# Patient Record
Sex: Female | Born: 1950
Health system: Southern US, Community
[De-identification: ages and names within clinical notes are randomized; demographics above are authoritative.]

## PROBLEM LIST (undated history)

## (undated) DIAGNOSIS — T4145XA Adverse effect of unspecified anesthetic, initial encounter: Secondary | ICD-10-CM

## (undated) DIAGNOSIS — R519 Headache, unspecified: Secondary | ICD-10-CM

## (undated) DIAGNOSIS — F419 Anxiety disorder, unspecified: Secondary | ICD-10-CM

## (undated) DIAGNOSIS — F32A Depression, unspecified: Secondary | ICD-10-CM

## (undated) DIAGNOSIS — N189 Chronic kidney disease, unspecified: Secondary | ICD-10-CM

## (undated) DIAGNOSIS — Z923 Personal history of irradiation: Secondary | ICD-10-CM

## (undated) DIAGNOSIS — K219 Gastro-esophageal reflux disease without esophagitis: Secondary | ICD-10-CM

## (undated) DIAGNOSIS — M67919 Unspecified disorder of synovium and tendon, unspecified shoulder: Secondary | ICD-10-CM

## (undated) DIAGNOSIS — R112 Nausea with vomiting, unspecified: Secondary | ICD-10-CM

## (undated) DIAGNOSIS — C50919 Malignant neoplasm of unspecified site of unspecified female breast: Secondary | ICD-10-CM

## (undated) DIAGNOSIS — R51 Headache: Secondary | ICD-10-CM

## (undated) DIAGNOSIS — M6530 Trigger finger, unspecified finger: Secondary | ICD-10-CM

## (undated) DIAGNOSIS — IMO0001 Reserved for inherently not codable concepts without codable children: Secondary | ICD-10-CM

## (undated) DIAGNOSIS — G4730 Sleep apnea, unspecified: Secondary | ICD-10-CM

## (undated) DIAGNOSIS — K5900 Constipation, unspecified: Secondary | ICD-10-CM

## (undated) DIAGNOSIS — Z9889 Other specified postprocedural states: Secondary | ICD-10-CM

## (undated) DIAGNOSIS — D869 Sarcoidosis, unspecified: Secondary | ICD-10-CM

## (undated) DIAGNOSIS — T8859XA Other complications of anesthesia, initial encounter: Secondary | ICD-10-CM

## (undated) DIAGNOSIS — M1990 Unspecified osteoarthritis, unspecified site: Secondary | ICD-10-CM

## (undated) DIAGNOSIS — F329 Major depressive disorder, single episode, unspecified: Secondary | ICD-10-CM

## (undated) DIAGNOSIS — I10 Essential (primary) hypertension: Secondary | ICD-10-CM

## (undated) HISTORY — PX: KIDNEY CYST REMOVAL: SHX684

## (undated) HISTORY — PX: TONSILLECTOMY: SUR1361

## (undated) HISTORY — DX: Constipation, unspecified: K59.00

## (undated) HISTORY — PX: SHOULDER ARTHROSCOPY W/ ROTATOR CUFF REPAIR: SHX2400

## (undated) HISTORY — PX: ABDOMINAL HYSTERECTOMY: SHX81

## (undated) HISTORY — PX: TOTAL KNEE ARTHROPLASTY: SHX125

## (undated) HISTORY — PX: OTHER SURGICAL HISTORY: SHX169

## (undated) HISTORY — PX: NEPHRECTOMY: SHX65

---
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
## 2001-05-11 HISTORY — PX: BREAST EXCISIONAL BIOPSY: SUR124

## 2004-05-11 DIAGNOSIS — N189 Chronic kidney disease, unspecified: Secondary | ICD-10-CM

## 2004-05-11 HISTORY — DX: Chronic kidney disease, unspecified: N18.9

## 2004-07-15 ENCOUNTER — Ambulatory Visit: Payer: Self-pay | Admitting: Internal Medicine

## 2005-01-01 ENCOUNTER — Encounter: Payer: Self-pay | Admitting: General Practice

## 2005-01-09 ENCOUNTER — Encounter: Payer: Self-pay | Admitting: General Practice

## 2005-02-08 ENCOUNTER — Encounter: Payer: Self-pay | Admitting: General Practice

## 2005-02-23 ENCOUNTER — Encounter: Payer: Self-pay | Admitting: Internal Medicine

## 2005-02-26 ENCOUNTER — Ambulatory Visit: Payer: Self-pay | Admitting: Internal Medicine

## 2005-03-11 ENCOUNTER — Encounter: Payer: Self-pay | Admitting: General Practice

## 2005-03-25 ENCOUNTER — Other Ambulatory Visit: Payer: Self-pay

## 2005-03-26 ENCOUNTER — Ambulatory Visit: Payer: Self-pay | Admitting: Unknown Physician Specialty

## 2005-05-11 HISTORY — PX: JOINT REPLACEMENT: SHX530

## 2005-12-09 ENCOUNTER — Ambulatory Visit: Payer: Self-pay | Admitting: Internal Medicine

## 2006-02-16 ENCOUNTER — Ambulatory Visit: Payer: Self-pay | Admitting: Internal Medicine

## 2006-02-24 ENCOUNTER — Ambulatory Visit: Payer: Self-pay | Admitting: Internal Medicine

## 2006-03-09 ENCOUNTER — Other Ambulatory Visit: Payer: Self-pay

## 2006-03-15 ENCOUNTER — Inpatient Hospital Stay: Payer: Self-pay | Admitting: Urology

## 2006-03-15 DIAGNOSIS — D1771 Benign lipomatous neoplasm of kidney: Secondary | ICD-10-CM

## 2006-03-31 ENCOUNTER — Ambulatory Visit: Payer: Self-pay | Admitting: Internal Medicine

## 2006-04-07 ENCOUNTER — Ambulatory Visit: Payer: Self-pay | Admitting: Vascular Surgery

## 2006-04-28 ENCOUNTER — Ambulatory Visit: Payer: Self-pay | Admitting: Internal Medicine

## 2006-06-16 ENCOUNTER — Ambulatory Visit: Payer: Self-pay | Admitting: Internal Medicine

## 2006-06-16 ENCOUNTER — Ambulatory Visit: Payer: Self-pay | Admitting: Pain Medicine

## 2006-06-28 ENCOUNTER — Ambulatory Visit: Payer: Self-pay | Admitting: Pain Medicine

## 2006-06-29 ENCOUNTER — Ambulatory Visit: Payer: Self-pay | Admitting: Pain Medicine

## 2006-07-15 ENCOUNTER — Ambulatory Visit: Payer: Self-pay | Admitting: Pain Medicine

## 2006-07-29 ENCOUNTER — Ambulatory Visit: Payer: Self-pay | Admitting: Pain Medicine

## 2006-08-05 ENCOUNTER — Ambulatory Visit: Payer: Self-pay | Admitting: Gastroenterology

## 2006-08-12 ENCOUNTER — Ambulatory Visit: Payer: Self-pay | Admitting: Physician Assistant

## 2006-12-28 ENCOUNTER — Ambulatory Visit: Payer: Self-pay | Admitting: Unknown Physician Specialty

## 2006-12-28 ENCOUNTER — Other Ambulatory Visit: Payer: Self-pay

## 2006-12-29 ENCOUNTER — Ambulatory Visit: Payer: Self-pay | Admitting: Unknown Physician Specialty

## 2007-09-01 ENCOUNTER — Ambulatory Visit: Payer: Self-pay | Admitting: Internal Medicine

## 2008-06-11 ENCOUNTER — Ambulatory Visit: Payer: Self-pay | Admitting: Unknown Physician Specialty

## 2008-06-12 ENCOUNTER — Inpatient Hospital Stay: Payer: Self-pay | Admitting: Unknown Physician Specialty

## 2008-10-17 ENCOUNTER — Ambulatory Visit: Payer: Self-pay | Admitting: Internal Medicine

## 2009-10-22 ENCOUNTER — Ambulatory Visit: Payer: Self-pay | Admitting: Internal Medicine

## 2009-12-06 ENCOUNTER — Ambulatory Visit: Payer: Self-pay

## 2010-10-24 ENCOUNTER — Ambulatory Visit: Payer: Self-pay | Admitting: Internal Medicine

## 2011-05-12 HISTORY — PX: COLONOSCOPY: SHX174

## 2011-10-27 ENCOUNTER — Ambulatory Visit: Payer: Self-pay | Admitting: Internal Medicine

## 2011-12-30 ENCOUNTER — Ambulatory Visit: Payer: Self-pay | Admitting: Gastroenterology

## 2012-11-02 ENCOUNTER — Ambulatory Visit: Payer: Self-pay | Admitting: Internal Medicine

## 2013-11-01 DIAGNOSIS — E7800 Pure hypercholesterolemia, unspecified: Secondary | ICD-10-CM | POA: Insufficient documentation

## 2013-11-01 DIAGNOSIS — M4800 Spinal stenosis, site unspecified: Secondary | ICD-10-CM | POA: Insufficient documentation

## 2013-11-01 DIAGNOSIS — I10 Essential (primary) hypertension: Secondary | ICD-10-CM | POA: Insufficient documentation

## 2013-11-30 ENCOUNTER — Ambulatory Visit: Payer: Self-pay | Admitting: Internal Medicine

## 2014-05-15 DIAGNOSIS — M1711 Unilateral primary osteoarthritis, right knee: Secondary | ICD-10-CM | POA: Diagnosis not present

## 2014-06-06 DIAGNOSIS — M549 Dorsalgia, unspecified: Secondary | ICD-10-CM | POA: Diagnosis not present

## 2014-06-06 DIAGNOSIS — M5136 Other intervertebral disc degeneration, lumbar region: Secondary | ICD-10-CM | POA: Diagnosis not present

## 2014-07-17 DIAGNOSIS — J042 Acute laryngotracheitis: Secondary | ICD-10-CM | POA: Diagnosis not present

## 2014-07-17 DIAGNOSIS — H5210 Myopia, unspecified eye: Secondary | ICD-10-CM | POA: Diagnosis not present

## 2014-07-17 DIAGNOSIS — H524 Presbyopia: Secondary | ICD-10-CM | POA: Diagnosis not present

## 2014-07-31 DIAGNOSIS — J209 Acute bronchitis, unspecified: Secondary | ICD-10-CM | POA: Diagnosis not present

## 2014-08-15 DIAGNOSIS — M7732 Calcaneal spur, left foot: Secondary | ICD-10-CM | POA: Diagnosis not present

## 2014-08-15 DIAGNOSIS — M7662 Achilles tendinitis, left leg: Secondary | ICD-10-CM | POA: Diagnosis not present

## 2014-08-15 DIAGNOSIS — M71572 Other bursitis, not elsewhere classified, left ankle and foot: Secondary | ICD-10-CM | POA: Diagnosis not present

## 2014-08-21 DIAGNOSIS — M71572 Other bursitis, not elsewhere classified, left ankle and foot: Secondary | ICD-10-CM | POA: Diagnosis not present

## 2014-08-21 DIAGNOSIS — M7662 Achilles tendinitis, left leg: Secondary | ICD-10-CM | POA: Diagnosis not present

## 2014-09-05 DIAGNOSIS — M71572 Other bursitis, not elsewhere classified, left ankle and foot: Secondary | ICD-10-CM | POA: Diagnosis not present

## 2014-09-05 DIAGNOSIS — M7662 Achilles tendinitis, left leg: Secondary | ICD-10-CM | POA: Diagnosis not present

## 2014-09-20 ENCOUNTER — Ambulatory Visit: Payer: Self-pay | Admitting: Podiatry

## 2014-09-24 DIAGNOSIS — M25561 Pain in right knee: Secondary | ICD-10-CM | POA: Diagnosis not present

## 2014-09-24 DIAGNOSIS — M1711 Unilateral primary osteoarthritis, right knee: Secondary | ICD-10-CM | POA: Diagnosis not present

## 2014-09-25 ENCOUNTER — Ambulatory Visit: Payer: Self-pay | Admitting: Podiatry

## 2014-09-27 ENCOUNTER — Ambulatory Visit: Payer: Self-pay | Admitting: Podiatry

## 2014-09-27 DIAGNOSIS — M7662 Achilles tendinitis, left leg: Secondary | ICD-10-CM | POA: Diagnosis not present

## 2014-09-27 DIAGNOSIS — M898X9 Other specified disorders of bone, unspecified site: Secondary | ICD-10-CM | POA: Diagnosis not present

## 2014-10-10 DIAGNOSIS — M898X9 Other specified disorders of bone, unspecified site: Secondary | ICD-10-CM | POA: Diagnosis not present

## 2014-10-10 DIAGNOSIS — M7662 Achilles tendinitis, left leg: Secondary | ICD-10-CM | POA: Diagnosis not present

## 2014-10-30 DIAGNOSIS — I10 Essential (primary) hypertension: Secondary | ICD-10-CM | POA: Diagnosis not present

## 2014-11-06 DIAGNOSIS — E780 Pure hypercholesterolemia: Secondary | ICD-10-CM | POA: Diagnosis not present

## 2014-11-06 DIAGNOSIS — Z0001 Encounter for general adult medical examination with abnormal findings: Secondary | ICD-10-CM | POA: Diagnosis not present

## 2014-11-06 DIAGNOSIS — M940 Chondrocostal junction syndrome [Tietze]: Secondary | ICD-10-CM | POA: Diagnosis not present

## 2014-11-06 DIAGNOSIS — I10 Essential (primary) hypertension: Secondary | ICD-10-CM | POA: Diagnosis not present

## 2014-11-06 DIAGNOSIS — C649 Malignant neoplasm of unspecified kidney, except renal pelvis: Secondary | ICD-10-CM | POA: Insufficient documentation

## 2014-11-07 ENCOUNTER — Other Ambulatory Visit: Payer: Self-pay | Admitting: Internal Medicine

## 2014-11-07 DIAGNOSIS — Z1231 Encounter for screening mammogram for malignant neoplasm of breast: Secondary | ICD-10-CM

## 2014-12-04 ENCOUNTER — Ambulatory Visit
Admission: RE | Admit: 2014-12-04 | Discharge: 2014-12-04 | Disposition: A | Discharge status: Home / Self Care | Payer: Commercial Managed Care - HMO | Source: Ambulatory Visit | Attending: Internal Medicine | Admitting: Internal Medicine

## 2014-12-04 ENCOUNTER — Other Ambulatory Visit: Payer: Self-pay | Admitting: Internal Medicine

## 2014-12-04 DIAGNOSIS — Z1231 Encounter for screening mammogram for malignant neoplasm of breast: Secondary | ICD-10-CM | POA: Insufficient documentation

## 2014-12-04 DIAGNOSIS — E2839 Other primary ovarian failure: Secondary | ICD-10-CM | POA: Diagnosis not present

## 2014-12-18 DIAGNOSIS — G8929 Other chronic pain: Secondary | ICD-10-CM | POA: Diagnosis not present

## 2014-12-18 DIAGNOSIS — N644 Mastodynia: Secondary | ICD-10-CM | POA: Diagnosis not present

## 2014-12-18 DIAGNOSIS — M25512 Pain in left shoulder: Secondary | ICD-10-CM | POA: Diagnosis not present

## 2015-01-29 DIAGNOSIS — M7732 Calcaneal spur, left foot: Secondary | ICD-10-CM | POA: Diagnosis not present

## 2015-01-29 DIAGNOSIS — M7662 Achilles tendinitis, left leg: Secondary | ICD-10-CM | POA: Diagnosis not present

## 2015-01-29 DIAGNOSIS — M898X9 Other specified disorders of bone, unspecified site: Secondary | ICD-10-CM | POA: Diagnosis not present

## 2015-02-12 ENCOUNTER — Encounter
Admission: RE | Admit: 2015-02-12 | Discharge: 2015-02-12 | Disposition: A | Discharge status: Home / Self Care | Payer: Commercial Managed Care - HMO | Source: Ambulatory Visit | Attending: Podiatry | Admitting: Podiatry

## 2015-02-12 DIAGNOSIS — M898X9 Other specified disorders of bone, unspecified site: Secondary | ICD-10-CM | POA: Diagnosis not present

## 2015-02-12 DIAGNOSIS — M7732 Calcaneal spur, left foot: Secondary | ICD-10-CM | POA: Insufficient documentation

## 2015-02-12 DIAGNOSIS — M7662 Achilles tendinitis, left leg: Secondary | ICD-10-CM | POA: Insufficient documentation

## 2015-02-12 DIAGNOSIS — Z01818 Encounter for other preprocedural examination: Secondary | ICD-10-CM | POA: Diagnosis not present

## 2015-02-12 DIAGNOSIS — I10 Essential (primary) hypertension: Secondary | ICD-10-CM | POA: Diagnosis not present

## 2015-02-12 HISTORY — DX: Major depressive disorder, single episode, unspecified: F32.9

## 2015-02-12 HISTORY — DX: Reserved for inherently not codable concepts without codable children: IMO0001

## 2015-02-12 HISTORY — DX: Headache, unspecified: R51.9

## 2015-02-12 HISTORY — DX: Depression, unspecified: F32.A

## 2015-02-12 HISTORY — DX: Anxiety disorder, unspecified: F41.9

## 2015-02-12 HISTORY — DX: Unspecified osteoarthritis, unspecified site: M19.90

## 2015-02-12 HISTORY — DX: Headache: R51

## 2015-02-12 HISTORY — DX: Gastro-esophageal reflux disease without esophagitis: K21.9

## 2015-02-12 HISTORY — DX: Essential (primary) hypertension: I10

## 2015-02-12 HISTORY — DX: Chronic kidney disease, unspecified: N18.9

## 2015-02-12 LAB — CBC
HCT: 42.1 % (ref 35.0–47.0)
Hemoglobin: 14.1 g/dL (ref 12.0–16.0)
MCH: 30.4 pg (ref 26.0–34.0)
MCHC: 33.6 g/dL (ref 32.0–36.0)
MCV: 90.6 fL (ref 80.0–100.0)
Platelets: 192 10*3/uL (ref 150–440)
RBC: 4.65 MIL/uL (ref 3.80–5.20)
RDW: 14.4 % (ref 11.5–14.5)
WBC: 7.1 10*3/uL (ref 3.6–11.0)

## 2015-02-12 LAB — BASIC METABOLIC PANEL
Anion gap: 7 (ref 5–15)
BUN: 21 mg/dL — ABNORMAL HIGH (ref 6–20)
CO2: 32 mmol/L (ref 22–32)
Calcium: 10.1 mg/dL (ref 8.9–10.3)
Chloride: 103 mmol/L (ref 101–111)
Creatinine, Ser: 0.87 mg/dL (ref 0.44–1.00)
GFR calc Af Amer: >60 mL/min (ref >60)
GFR calc non Af Amer: >60 mL/min (ref >60)
Glucose, Bld: 106 mg/dL — ABNORMAL HIGH (ref 65–99)
Potassium: 3.6 mmol/L (ref 3.5–5.1)
Sodium: 142 mmol/L (ref 135–145)

## 2015-02-12 LAB — DIFFERENTIAL
BASOS ABS: 0.1 10*3/uL (ref 0–0.1)
BASOS PCT: 1 %
EOS ABS: 0.5 10*3/uL (ref 0–0.7)
Eosinophils Relative: 7 %
LYMPHS ABS: 1.1 10*3/uL (ref 1.0–3.6)
Lymphocytes Relative: 16 %
Monocytes Absolute: 1 10*3/uL — ABNORMAL HIGH (ref 0.2–0.9)
Monocytes Relative: 15 %
NEUTROS PCT: 61 %
Neutro Abs: 4.4 10*3/uL (ref 1.4–6.5)

## 2015-02-12 NOTE — Patient Instructions (Signed)
  Your procedure is scheduled [id number] 14, 2016 (Friday) Report to Day Surgery.[facility]) To find out your arrival time please call [phone] between 1PM - 3PM on February 21, 2015 (Thursday).  Remember: Instructions that are not followed completely may result in serious medical risk, up to and including death, or upon the discretion of your surgeon and anesthesiologist your surgery may need to be rescheduled.    ___x_ 1. Do not eat food or drink liquids after midnight. No gum chewing or hard candies.     ____ 2. No Alcohol for 24 hours before or after surgery.   ____ 3. Bring all medications with you on the day of surgery if instructed.    __x__ 4. Notify your doctor if there is any change in your medical condition     (cold, fever, infections).     Do not wear jewelry, make-up, hairpins, clips or nail polish.  Do not wear lotions, powders, or perfumes. You may wear deodorant.  Do not shave 48 hours prior to surgery. Men may shave face and neck.  Do not bring valuables to the hospital.    [facility] is not responsible for any belongings or valuables.               Contacts, dentures or bridgework may not be worn into surgery.  Leave your suitcase in the car. After surgery it may be brought to your room.  For patients admitted to the hospital, discharge time is determined by your                treatment team.   Patients discharged the day of surgery will not be allowed to drive home.   Please read over the following fact sheets that you were given:   Surgical Site Infection Prevention   __x__ Take these medicines the morning of surgery with A SIP OF WATER:    1. Omeprazole  2.   3.   4.  5.  6.  ____ Fleet Enema (as directed)   __x__ Use CHG Soap as directed  ____ Use inhalers on the day of surgery  ____ Stop metformin 2 days prior to surgery    ____ Take 1/2 of usual insulin dose the night before surgery and none on the morning of surgery.   ____ Stop  Coumadin/Plavix/aspirin on   x__ Stop Anti-inflammatories on (Stop Nabumetone one week before surgery)   _x___ Stop supplements until after surgery.  (Stop Fish Oil ,Melatonin now)  ____ Bring C-Pap to the hospital.

## 2015-02-13 DIAGNOSIS — G8929 Other chronic pain: Secondary | ICD-10-CM | POA: Diagnosis not present

## 2015-02-13 DIAGNOSIS — M19012 Primary osteoarthritis, left shoulder: Secondary | ICD-10-CM | POA: Diagnosis not present

## 2015-02-13 DIAGNOSIS — M25561 Pain in right knee: Secondary | ICD-10-CM | POA: Diagnosis not present

## 2015-02-13 DIAGNOSIS — M1711 Unilateral primary osteoarthritis, right knee: Secondary | ICD-10-CM | POA: Diagnosis not present

## 2015-02-13 DIAGNOSIS — M75102 Unspecified rotator cuff tear or rupture of left shoulder, not specified as traumatic: Secondary | ICD-10-CM | POA: Diagnosis not present

## 2015-02-13 DIAGNOSIS — M7582 Other shoulder lesions, left shoulder: Secondary | ICD-10-CM | POA: Diagnosis not present

## 2015-02-13 DIAGNOSIS — M25512 Pain in left shoulder: Secondary | ICD-10-CM | POA: Diagnosis not present

## 2015-02-19 DIAGNOSIS — M7582 Other shoulder lesions, left shoulder: Secondary | ICD-10-CM | POA: Diagnosis not present

## 2015-02-20 DIAGNOSIS — I10 Essential (primary) hypertension: Secondary | ICD-10-CM | POA: Diagnosis not present

## 2015-02-20 DIAGNOSIS — E7800 Pure hypercholesterolemia, unspecified: Secondary | ICD-10-CM | POA: Diagnosis not present

## 2015-02-22 ENCOUNTER — Encounter
Admission: RE | Disposition: A | Discharge status: Home / Self Care | Payer: Self-pay | Source: Ambulatory Visit | Attending: Podiatry

## 2015-02-22 ENCOUNTER — Encounter: Payer: Self-pay | Admitting: *Deleted

## 2015-02-22 ENCOUNTER — Ambulatory Visit
Admission: RE | Admit: 2015-02-22 | Discharge: 2015-02-22 | Disposition: A | Discharge status: Home / Self Care | Payer: Commercial Managed Care - HMO | Source: Ambulatory Visit | Attending: Podiatry | Admitting: Podiatry

## 2015-02-22 ENCOUNTER — Ambulatory Visit: Payer: Commercial Managed Care - HMO | Admitting: Anesthesiology

## 2015-02-22 DIAGNOSIS — M1990 Unspecified osteoarthritis, unspecified site: Secondary | ICD-10-CM | POA: Insufficient documentation

## 2015-02-22 DIAGNOSIS — I10 Essential (primary) hypertension: Secondary | ICD-10-CM | POA: Diagnosis not present

## 2015-02-22 DIAGNOSIS — M84872 Other disorders of continuity of bone, left ankle and foot: Secondary | ICD-10-CM | POA: Diagnosis not present

## 2015-02-22 DIAGNOSIS — Z905 Acquired absence of kidney: Secondary | ICD-10-CM | POA: Diagnosis not present

## 2015-02-22 DIAGNOSIS — F418 Other specified anxiety disorders: Secondary | ICD-10-CM | POA: Diagnosis not present

## 2015-02-22 DIAGNOSIS — M7662 Achilles tendinitis, left leg: Secondary | ICD-10-CM | POA: Insufficient documentation

## 2015-02-22 DIAGNOSIS — R0609 Other forms of dyspnea: Secondary | ICD-10-CM | POA: Diagnosis not present

## 2015-02-22 DIAGNOSIS — M898X9 Other specified disorders of bone, unspecified site: Secondary | ICD-10-CM | POA: Diagnosis not present

## 2015-02-22 DIAGNOSIS — M7732 Calcaneal spur, left foot: Secondary | ICD-10-CM | POA: Diagnosis not present

## 2015-02-22 DIAGNOSIS — M899 Disorder of bone, unspecified: Secondary | ICD-10-CM | POA: Diagnosis not present

## 2015-02-22 DIAGNOSIS — N289 Disorder of kidney and ureter, unspecified: Secondary | ICD-10-CM | POA: Diagnosis not present

## 2015-02-22 DIAGNOSIS — M7660 Achilles tendinitis, unspecified leg: Secondary | ICD-10-CM | POA: Diagnosis present

## 2015-02-22 HISTORY — PX: ACHILLES TENDON SURGERY: SHX542

## 2015-02-22 HISTORY — PX: OSTECTOMY: SHX6439

## 2015-02-22 SURGERY — OSTECTOMY
Anesthesia: General | Laterality: Left

## 2015-02-22 MED ORDER — DEXAMETHASONE SODIUM PHOSPHATE 4 MG/ML IJ SOLN
INTRAMUSCULAR | Status: DC | PRN
  Administered 2015-02-22: 10 mg via INTRAVENOUS

## 2015-02-22 MED ORDER — CLINDAMYCIN PHOSPHATE 600 MG/50ML IV SOLN
INTRAVENOUS | Status: AC
  Filled 2015-02-22: qty 50

## 2015-02-22 MED ORDER — LIDOCAINE HCL (CARDIAC) 20 MG/ML IV SOLN
INTRAVENOUS | Status: DC | PRN
  Administered 2015-02-22 (×2): 100 mg via INTRAVENOUS

## 2015-02-22 MED ORDER — ONDANSETRON HCL 4 MG/2ML IJ SOLN
4.0000 mg | Freq: Once | INTRAMUSCULAR | Status: AC | PRN
  Administered 2015-02-22: 4 mg via INTRAVENOUS

## 2015-02-22 MED ORDER — EPHEDRINE SULFATE 50 MG/ML IJ SOLN
INTRAMUSCULAR | Status: DC | PRN
  Administered 2015-02-22: 5 mg via INTRAVENOUS
  Administered 2015-02-22 (×2): 10 mg via INTRAVENOUS

## 2015-02-22 MED ORDER — LIDOCAINE HCL (PF) 1 % IJ SOLN
INTRAMUSCULAR | Status: AC
  Filled 2015-02-22: qty 30

## 2015-02-22 MED ORDER — SUCCINYLCHOLINE CHLORIDE 20 MG/ML IJ SOLN
INTRAMUSCULAR | Status: DC | PRN
  Administered 2015-02-22: 100 mg via INTRAVENOUS

## 2015-02-22 MED ORDER — SODIUM CHLORIDE 0.9 % IR SOLN
Status: DC | PRN
  Administered 2015-02-22: 150 mL

## 2015-02-22 MED ORDER — PHENYLEPHRINE HCL 10 MG/ML IJ SOLN
INTRAMUSCULAR | Status: DC | PRN
  Administered 2015-02-22: 100 ug via INTRAVENOUS

## 2015-02-22 MED ORDER — OXYCODONE-ACETAMINOPHEN 7.5-325 MG PO TABS
1.0000 | ORAL_TABLET | ORAL | Status: DC | PRN
  Administered 2015-02-22: 1 via ORAL

## 2015-02-22 MED ORDER — BUPIVACAINE HCL (PF) 0.5 % IJ SOLN
INTRAMUSCULAR | Status: AC
  Filled 2015-02-22: qty 30

## 2015-02-22 MED ORDER — ENOXAPARIN SODIUM 30 MG/0.3ML ~~LOC~~ SOLN
30.0000 mg | SUBCUTANEOUS | Status: DC
  Administered 2015-02-22: 30 mg via SUBCUTANEOUS
  Filled 2015-02-22 (×2): qty 0.3

## 2015-02-22 MED ORDER — ENOXAPARIN SODIUM 40 MG/0.4ML ~~LOC~~ SOLN
40.0000 mg | Freq: Two times a day (BID) | SUBCUTANEOUS | Status: DC
  Filled 2015-02-22 (×3): qty 0.4

## 2015-02-22 MED ORDER — BUPIVACAINE HCL (PF) 0.5 % IJ SOLN
INTRAMUSCULAR | Status: DC | PRN
  Administered 2015-02-22: 6 mL

## 2015-02-22 MED ORDER — CLINDAMYCIN PHOSPHATE 600 MG/50ML IV SOLN
600.0000 mg | Freq: Once | INTRAVENOUS | Status: AC
  Administered 2015-02-22: 600 mg via INTRAVENOUS

## 2015-02-22 MED ORDER — LIDOCAINE-EPINEPHRINE 1 %-1:100000 IJ SOLN
INTRAMUSCULAR | Status: DC | PRN
  Administered 2015-02-22: 8 mL

## 2015-02-22 MED ORDER — OXYCODONE-ACETAMINOPHEN 7.5-325 MG PO TABS: 1.0000 | ORAL_TABLET | ORAL | Status: DC | PRN

## 2015-02-22 MED ORDER — OXYCODONE-ACETAMINOPHEN 7.5-325 MG PO TABS
ORAL_TABLET | ORAL | Status: AC
  Administered 2015-02-22: 1 via ORAL
  Filled 2015-02-22: qty 1

## 2015-02-22 MED ORDER — SODIUM CHLORIDE 0.9 % IV SOLN
INTRAVENOUS | Status: DC
  Administered 2015-02-22: 07:00:00 via INTRAVENOUS

## 2015-02-22 MED ORDER — LIDOCAINE-EPINEPHRINE 1 %-1:100000 IJ SOLN
INTRAMUSCULAR | Status: AC
  Filled 2015-02-22: qty 1

## 2015-02-22 MED ORDER — ONDANSETRON HCL 4 MG/2ML IJ SOLN
INTRAMUSCULAR | Status: AC
  Administered 2015-02-22: 4 mg via INTRAVENOUS
  Filled 2015-02-22: qty 2

## 2015-02-22 MED ORDER — ENOXAPARIN (LOVENOX) PATIENT EDUCATION KIT
PACK | Freq: Once | Status: AC
  Administered 2015-02-22: 30

## 2015-02-22 MED ORDER — PROPOFOL 10 MG/ML IV BOLUS
INTRAVENOUS | Status: DC | PRN
  Administered 2015-02-22: 50 mg via INTRAVENOUS
  Administered 2015-02-22: 150 mg via INTRAVENOUS
  Administered 2015-02-22: 30 mg via INTRAVENOUS

## 2015-02-22 MED ORDER — ONDANSETRON HCL 4 MG/2ML IJ SOLN
INTRAMUSCULAR | Status: DC | PRN
  Administered 2015-02-22: 4 mg via INTRAVENOUS

## 2015-02-22 MED ORDER — FENTANYL CITRATE (PF) 100 MCG/2ML IJ SOLN
INTRAMUSCULAR | Status: DC | PRN
  Administered 2015-02-22: 100 ug via INTRAVENOUS

## 2015-02-22 MED ORDER — PROMETHAZINE HCL 12.5 MG PO TABS: 12.5000 mg | ORAL_TABLET | Freq: Four times a day (QID) | ORAL | Status: DC | PRN

## 2015-02-22 MED ORDER — FENTANYL CITRATE (PF) 100 MCG/2ML IJ SOLN: 25.0000 ug | INTRAMUSCULAR | Status: DC | PRN

## 2015-02-22 SURGICAL SUPPLY — 56 items
ANCHOR ALL-SUT Q-FIX 2.8 (Anchor) ×3 IMPLANT
BAG COUNTER SPONGE EZ (MISCELLANEOUS) IMPLANT
BANDAGE ELASTIC 4 CLIP NS LF (GAUZE/BANDAGES/DRESSINGS) ×6 IMPLANT
BANDAGE STRETCH 3X4.1 STRL (GAUZE/BANDAGES/DRESSINGS) ×3 IMPLANT
BLADE MED AGGRESSIVE (BLADE) ×3 IMPLANT
BLADE SURG 15 STRL LF DISP TIS (BLADE) ×2 IMPLANT
BLADE SURG 15 STRL SS (BLADE) ×4
BLADE SURG MINI STRL (BLADE) ×3 IMPLANT
BNDG COHESIVE 4X5 TAN STRL (GAUZE/BANDAGES/DRESSINGS) ×3 IMPLANT
BNDG ESMARK 4X12 TAN STRL LF (GAUZE/BANDAGES/DRESSINGS) ×3 IMPLANT
BNDG GAUZE 4.5X4.1 6PLY STRL (MISCELLANEOUS) ×3 IMPLANT
CANISTER SUCT 1200ML W/VALVE (MISCELLANEOUS) ×3 IMPLANT
CLOSURE WOUND 1/4X4 (GAUZE/BANDAGES/DRESSINGS) ×1
COUNTER SPONGE BAG EZ (MISCELLANEOUS)
DECANTER SPIKE VIAL GLASS SM (MISCELLANEOUS) ×3 IMPLANT
DRAPE FLUOR MINI C-ARM 54X84 (DRAPES) ×3 IMPLANT
DRSG TEGADERM 4X4.75 (GAUZE/BANDAGES/DRESSINGS) IMPLANT
DURAPREP 26ML APPLICATOR (WOUND CARE) ×3 IMPLANT
GAUZE PETRO XEROFOAM 1X8 (MISCELLANEOUS) ×3 IMPLANT
GAUZE SPONGE 4X4 12PLY STRL (GAUZE/BANDAGES/DRESSINGS) ×3 IMPLANT
GAUZE STRETCH 2X75IN STRL (MISCELLANEOUS) IMPLANT
GLOVE BIO SURGEON STRL SZ7.5 (GLOVE) ×6 IMPLANT
GLOVE BIO SURGEON STRL SZ8 (GLOVE) ×3 IMPLANT
GLOVE INDICATOR 7.5 STRL GRN (GLOVE) ×3 IMPLANT
GLOVE INDICATOR 8.0 STRL GRN (GLOVE) ×6 IMPLANT
GOWN STRL REUS W/ TWL LRG LVL3 (GOWN DISPOSABLE) ×2 IMPLANT
GOWN STRL REUS W/TWL LRG LVL3 (GOWN DISPOSABLE) ×4
IV NS 250ML (IV SOLUTION) ×2
IV NS 250ML BAXH (IV SOLUTION) ×1 IMPLANT
KIT RM TURNOVER STRD PROC AR (KITS) ×3 IMPLANT
KIT SUTURE 2.8 Q-FIX DISP (MISCELLANEOUS) ×3 IMPLANT
LABEL OR SOLS (LABEL) IMPLANT
NDL SAFETY 18GX1.5 (NEEDLE) ×3 IMPLANT
NEEDLE FILTER BLUNT 18X 1/2SAF (NEEDLE)
NEEDLE FILTER BLUNT 18X1 1/2 (NEEDLE) IMPLANT
NEEDLE HYPO 25X1 1.5 SAFETY (NEEDLE) ×3 IMPLANT
NEEDLE MAYO CATGUT SZ4 (NEEDLE) ×3 IMPLANT
NS IRRIG 500ML POUR BTL (IV SOLUTION) ×3 IMPLANT
PACK EXTREMITY ARMC (MISCELLANEOUS) ×3 IMPLANT
PAD CAST CTTN 4X4 STRL (SOFTGOODS) ×2 IMPLANT
PAD GROUND ADULT SPLIT (MISCELLANEOUS) ×3 IMPLANT
PADDING CAST COTTON 4X4 STRL (SOFTGOODS) ×4
PENCIL ELECTRO HAND CTR (MISCELLANEOUS) IMPLANT
RASP SM TEAR CROSS CUT (RASP) ×3 IMPLANT
STOCKINETTE M/LG 89821 (MISCELLANEOUS) ×3 IMPLANT
STOCKINETTE STRL 6IN 960660 (GAUZE/BANDAGES/DRESSINGS) ×3 IMPLANT
STRIP CLOSURE SKIN 1/4X4 (GAUZE/BANDAGES/DRESSINGS) ×2 IMPLANT
SUT VIC AB 2-0 CT1 27 (SUTURE) ×2
SUT VIC AB 2-0 CT1 TAPERPNT 27 (SUTURE) ×1 IMPLANT
SUT VIC AB 3-0 SH 27 (SUTURE) ×2
SUT VIC AB 3-0 SH 27X BRD (SUTURE) ×1 IMPLANT
SUT VIC AB 4-0 FS2 27 (SUTURE) ×3 IMPLANT
SUT VICRYL AB 3-0 FS1 BRD 27IN (SUTURE) IMPLANT
SYR 3ML LL SCALE MARK (SYRINGE) IMPLANT
SYRINGE 10CC LL (SYRINGE) ×3 IMPLANT
WAND TENDON TOPAZ 0 ANGL (MISCELLANEOUS) ×3 IMPLANT

## 2015-02-22 NOTE — Anesthesia Postprocedure Evaluation (Signed)
  Anesthesia Post-op Note  Patient: Jill Bond  Procedure(s) Performed: Procedure(s): OSTECTOMY (Left) Secondary ACHILLES TENDON REPAIR (Left)  Anesthesia type:General  Patient location: PACU  Post pain: Pain level controlled  Post assessment: Post-op Vital signs reviewed, Patient's Cardiovascular Status Stable, Respiratory Function Stable, Patent Airway and No signs of Nausea or vomiting  Post vital signs: Reviewed and stable  Last Vitals:  Filed Vitals:   02/22/15 0909  BP: 140/64  Pulse: 78  Temp: 37 C  Resp: 18    Level of consciousness: awake, alert  and patient cooperative  Complications: No apparent anesthesia complications

## 2015-02-22 NOTE — H&P (Signed)
H and P has been reviewed and no changes are noted.  

## 2015-02-22 NOTE — Op Note (Signed)
Operative note None  Surgeon: Dr. Albertine Patricia, DPM.    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 Secondary repair Achilles tendon with quick anchor tendon anchor system left heel and Achilles   2. Excision of exostosis from posterior left heel        EBL: Less than 10 cc    Anesthesia:general    Hemostasis: Thigh tourniquet 325 mmHg pressure    Specimen: Fibrotic tendon and bone from the left retrocalcaneal region    Complications: None    Operative indications: Chronic pain unresponsive to conservative care    Procedure:  Patient was brought into the OR and placed on the operating table in theprone position. After anesthesia was obtained theleft lower extremity was prepped and draped in usual sterile fashion.  Operative Report: Patient was in the prone position and attention was directed to the posterior left heel where a 5 cm linear incision was made along the midsection of the Achilles tendon at its insertion area and just proximal LAD. The incision was deepened sharp blunt dissection bleeders clamped and bovied as required. Tendon was then incised longitudinally and freed away from the prominence of bone at its insertion site. Once this was appropriately freed the tendon calcinosis and exostosis was removed from the area. A portion of the proximal posterior calcaneus was prominent on the lateral aspect over this resected and rasped smoothly also. The posterior aspect of the calcaneus was rasped smoothly until all bone was achieved. All bony proliferation was removed in the process. Fibrotic tendon was also identified this was excised sharply. At this point the tendon was treated with Topaz to promote better tendon healing to the region.  At this time a quick fix 2.8 anchor was embedded into the posterior calcaneal body and 2 separate  sutures were available and a free needle was used to sew each into the tendon and anchored down to bone one more distal more proximal. This was sutured into the both the medial side and the lateral side. This was done after copious irrigation. The remaining tendon incision was then closed with 2-0 Vicryl's continuous stitch. 2 simple interrupted sutures placed in the Achilles tendon distally to reattach it to the tendon fragments in that area. There is an copiously irrigated and the tendon sheath and peritenon was then closed with 4 Vicryl in continuous stitch as were deep superficial fascial layers. Skin was closed with 4 Vicryl subcuticular stitch.  There was an block 0.5% Marcaine plain and sterile compressive dressings placed across wound consisting of Steri-Strips Xeroform gauze 4 x 4's Kling and Kerlix. Posterior splinting was placed on the left foot leg in the operating room. Patient is to remain nonweightbearing on this.    Patient tolerated the procedure and anesthesia well.  Was transported from the OR to the PACU with all vital signs stable and vascular status intact. To be discharged per routine protocol.  Will follow up in approximately 1 week in the outpatient clinic.

## 2015-02-22 NOTE — Anesthesia Preprocedure Evaluation (Signed)
Anesthesia Evaluation  Patient identified by MRN, date of birth, ID band Patient awake    Reviewed: Allergy & Precautions, NPO status , Patient's Chart, lab work & pertinent test results  History of Anesthesia Complications (+) PONV  Airway Mallampati: I       Dental  (+) Teeth Intact   Pulmonary neg pulmonary ROS,           Cardiovascular hypertension, Pt. on medications and Pt. on home beta blockers + DOE       Neuro/Psych Anxiety Depression    GI/Hepatic GERD  Medicated,  Endo/Other  negative endocrine ROS  Renal/GU Renal disease (s/p nephrectomy)     Musculoskeletal  (+) Arthritis , Osteoarthritis,    Abdominal   Peds  Hematology   Anesthesia Other Findings   Reproductive/Obstetrics                             Anesthesia Physical Anesthesia Plan  ASA: III  Anesthesia Plan: General   Post-op Pain Management:    Induction:   Airway Management Planned: Oral ETT  Additional Equipment:   Intra-op Plan:   Post-operative Plan:   Informed Consent: I have reviewed the patients History and Physical, chart, labs and discussed the procedure including the risks, benefits and alternatives for the proposed anesthesia with the patient or authorized representative who has indicated his/her understanding and acceptance.     Plan Discussed with:   Anesthesia Plan Comments:         Anesthesia Quick Evaluation

## 2015-02-22 NOTE — Discharge Instructions (Signed)
[place] DR. [place]: You must stay completely nonweightbearing on the involved left foot [place]   1. Take your medication as prescribed.  Pain medication should be taken only as needed.  2. Keep the dressing clean, dry and intact.  3. Keep your foot elevated above the heart level for the first 48 hours.  4. Walking to the bathroom and brief periods of walking are acceptable, unless we have instructed you to be non-weight bearing.  5. Always wear your post-op shoe when walking.  Always use your crutches if you are to be non-weight bearing.  6. Do not take a shower. Baths are permissible as long as the foot is kept out of the water.   7. Every hour you are awake:  - Bend your knee 15 times. - Flex foot 15 times - Massage calf 15 times  8. Call [facility] [phone]) if any of the following problems occur: - You develop a temperature or fever. - The bandage becomes saturated with blood. - Medication does not stop your pain. - Injury of the foot occurs. - Any symptoms of infection including redness, odor, or red streaks running from wound. AMBULATORY SURGERY  DISCHARGE INSTRUCTIONS   The drugs that you were given will stay in your system until tomorrow so for the next 24 hours you should not:  Drive an automobile Make any legal decisions Drink any alcoholic beverage   You may resume regular meals tomorrow.  Today it is better to start with liquids and gradually work up to solid foods.  You may eat anything you prefer, but it is better to start with liquids, then soup and crackers, and gradually work up to solid foods.   Please notify your doctor immediately if you have any unusual bleeding, trouble breathing, redness and pain at the surgery site, drainage, fever, or pain not relieved by medication.    Additional  Instructions:        Please contact your physician with any problems or Same Day Surgery at [phone], Monday through Friday 6 am to 4 pm, or [place] at [facility] number at [phone].

## 2015-02-22 NOTE — Anesthesia Postprocedure Evaluation (Signed)
  Anesthesia Post-op Note  Patient: Jill Bond  Procedure(s) Performed: Procedure(s): OSTECTOMY (Left) Secondary ACHILLES TENDON REPAIR (Left)  Anesthesia type:General  Patient location: PACU  Post pain: Pain level controlled  Post assessment: Post-op Vital signs reviewed, Patient's Cardiovascular Status Stable, Respiratory Function Stable, Patent Airway and No signs of Nausea or vomiting  Post vital signs: Reviewed and stable  Last Vitals:  Filed Vitals:   02/22/15 1049  BP: 128/62  Pulse: 73  Temp: 36.4 C  Resp: 20    Level of consciousness: awake, alert  and patient cooperative  Complications: No apparent anesthesia complications

## 2015-02-22 NOTE — Transfer of Care (Signed)
Immediate Anesthesia Transfer of Care Note  Patient: Jill Bond  Procedure(s) Performed: Procedure(s): OSTECTOMY (Left) Secondary ACHILLES TENDON REPAIR (Left)  Patient Location: PACU  Anesthesia Type:General  Level of Consciousness: awake, alert , oriented and patient cooperative  Airway & Oxygen Therapy: Patient Spontanous Breathing and Patient connected to nasal cannula oxygen  Post-op Assessment: Report given to RN  Post vital signs: Reviewed and stable  Last Vitals:  Filed Vitals:   02/22/15 0909  BP: 140/64  Pulse: 78  Temp: 37 C  Resp: 18    Complications: No apparent anesthesia complications

## 2015-02-22 NOTE — Anesthesia Procedure Notes (Signed)
Procedure Name: Intubation Date/Time: 02/22/2015 7:23 AM Performed by: Alda Berthold Pre-anesthesia Checklist: Patient identified, Patient being monitored, Timeout performed, Emergency Drugs available and Suction available Patient Re-evaluated:Patient Re-evaluated prior to inductionOxygen Delivery Method: Circle system utilized Preoxygenation: Pre-oxygenation with 100% oxygen Intubation Type: IV induction Ventilation: Mask ventilation without difficulty Laryngoscope Size: Mac and 3 Grade View: Grade I Tube type: Oral Tube size: 7.0 mm Number of attempts: 1 Placement Confirmation: ETT inserted through vocal cords under direct vision,  positive ETCO2 and breath sounds checked- equal and bilateral Secured at: 21 cm Tube secured with: Tape Dental Injury: Teeth and Oropharynx as per pre-operative assessment

## 2015-02-25 LAB — SURGICAL PATHOLOGY

## 2015-02-26 DIAGNOSIS — Z9889 Other specified postprocedural states: Secondary | ICD-10-CM | POA: Diagnosis not present

## 2015-02-26 DIAGNOSIS — M7662 Achilles tendinitis, left leg: Secondary | ICD-10-CM | POA: Diagnosis not present

## 2015-03-05 DIAGNOSIS — M7662 Achilles tendinitis, left leg: Secondary | ICD-10-CM | POA: Diagnosis not present

## 2015-03-05 DIAGNOSIS — Z9889 Other specified postprocedural states: Secondary | ICD-10-CM | POA: Diagnosis not present

## 2015-03-12 DIAGNOSIS — M7662 Achilles tendinitis, left leg: Secondary | ICD-10-CM | POA: Diagnosis not present

## 2015-03-26 DIAGNOSIS — M7662 Achilles tendinitis, left leg: Secondary | ICD-10-CM | POA: Diagnosis not present

## 2015-03-26 DIAGNOSIS — Z9889 Other specified postprocedural states: Secondary | ICD-10-CM | POA: Diagnosis not present

## 2015-04-23 DIAGNOSIS — I10 Essential (primary) hypertension: Secondary | ICD-10-CM | POA: Diagnosis not present

## 2015-04-23 DIAGNOSIS — Z9889 Other specified postprocedural states: Secondary | ICD-10-CM | POA: Diagnosis not present

## 2015-05-15 DIAGNOSIS — I10 Essential (primary) hypertension: Secondary | ICD-10-CM | POA: Diagnosis not present

## 2015-05-15 DIAGNOSIS — Z6841 Body Mass Index (BMI) 40.0 and over, adult: Secondary | ICD-10-CM | POA: Diagnosis not present

## 2015-05-28 DIAGNOSIS — J042 Acute laryngotracheitis: Secondary | ICD-10-CM | POA: Diagnosis not present

## 2015-06-11 DIAGNOSIS — M7662 Achilles tendinitis, left leg: Secondary | ICD-10-CM | POA: Diagnosis not present

## 2015-06-18 DIAGNOSIS — R509 Fever, unspecified: Secondary | ICD-10-CM | POA: Diagnosis not present

## 2015-07-03 DIAGNOSIS — M7662 Achilles tendinitis, left leg: Secondary | ICD-10-CM | POA: Diagnosis not present

## 2015-08-06 DIAGNOSIS — I10 Essential (primary) hypertension: Secondary | ICD-10-CM | POA: Diagnosis not present

## 2015-08-14 DIAGNOSIS — I10 Essential (primary) hypertension: Secondary | ICD-10-CM | POA: Diagnosis not present

## 2015-08-14 DIAGNOSIS — M940 Chondrocostal junction syndrome [Tietze]: Secondary | ICD-10-CM | POA: Diagnosis not present

## 2015-09-03 DIAGNOSIS — M1711 Unilateral primary osteoarthritis, right knee: Secondary | ICD-10-CM | POA: Diagnosis not present

## 2015-09-10 DIAGNOSIS — J042 Acute laryngotracheitis: Secondary | ICD-10-CM | POA: Diagnosis not present

## 2015-09-13 DIAGNOSIS — M1711 Unilateral primary osteoarthritis, right knee: Secondary | ICD-10-CM | POA: Diagnosis not present

## 2015-09-13 DIAGNOSIS — M25561 Pain in right knee: Secondary | ICD-10-CM | POA: Diagnosis not present

## 2015-09-26 NOTE — H&P (Signed)
TOTAL KNEE ADMISSION H&P  Patient is being admitted for right total knee arthroplasty.  Subjective:  Chief Complaint:   Right knee primary OA / pain  HPI: Jill Bond, 65 y.o. female, has a history of pain and functional disability in the right knee due to arthritis and has failed non-surgical conservative treatments for greater than 12 weeks to include NSAID's and/or analgesics and activity modification.  Onset of symptoms was gradual, starting  years ago with gradually worsening course since that time. The patient noted prior procedures on the knee to include  arthroplasty on the left knee(s).  Patient currently rates pain in the right knee(s) at 8 out of 10 with activity. Patient has worsening of pain with activity and weight bearing, pain that interferes with activities of daily living, pain with passive range of motion, crepitus and joint swelling.  Patient has evidence of periarticular osteophytes and joint space narrowing by imaging studies.  There is no active infection.   Risks, benefits and expectations were discussed with the patient.  Risks including but not limited to the risk of anesthesia, blood clots, nerve damage, blood vessel damage, failure of the prosthesis, infection and up to and including death.  Patient understand the risks, benefits and expectations and wishes to proceed with surgery.   PCP: Madelyn Brunner, MD  D/C Plans:      Home with HHPT / SNF  Post-op Meds:       No Rx given   Tranexamic Acid:      To be given - IV   Decadron:      Is to be given  FYI:     ?   Past Medical History  Diagnosis Date  . Cancer ([place])     kidney  . Hypertension   . Shortness of breath dyspnea     with exertion  . Anxiety   . Depression   . Chronic kidney disease 2006    Left Nephrectomy  . GERD (gastroesophageal reflux disease)   . Headache   . Arthritis     Past Surgical History  Procedure Laterality Date  . Breast biopsy Left 2003    neg  . Tonsillectomy     . Abdominal hysterectomy    . Joint replacement Left     Total Knee Replacement  . Shoulder arthroscopy w/ rotator cuff repair Left   . Ostectomy Left 02/22/2015    Procedure: OSTECTOMY;  Surgeon: Albertine Patricia, DPM;  Location: [facility] ORS;  Service: Podiatry;  Laterality: Left;  . Achilles tendon surgery Left 02/22/2015    Procedure: Secondary ACHILLES TENDON REPAIR;  Surgeon: Albertine Patricia, DPM;  Location: [facility] ORS;  Service: Podiatry;  Laterality: Left;    No prescriptions prior to admission   Allergies  Allergen Reactions  . Codeine Nausea And Vomiting  . Penicillins Hives    Social History  Substance Use Topics  . Smoking status: Never Smoker   . Smokeless tobacco: Never Used  . Alcohol Use: No    Family History  Problem Relation Age of Onset  . Breast cancer Mother 1  . Heart failure Father   . Prostate cancer Father      Review of Systems  Constitutional: Negative.   Eyes: Negative.   Respiratory: Positive for shortness of breath (on exertion).   Cardiovascular: Negative.   Gastrointestinal: Positive for heartburn.  Genitourinary: Negative.   Musculoskeletal: Positive for joint pain.  Skin: Negative.   Neurological: Positive for headaches.  Endo/Heme/Allergies: Negative.  Psychiatric/Behavioral: Positive for depression. The patient is nervous/anxious.     Objective:  Physical Exam  Constitutional: She is oriented to person, place, and time. She appears well-developed.  HENT:  Head: Normocephalic.  Eyes: Pupils are equal, round, and reactive to light.  Neck: Neck supple. No JVD present. No tracheal deviation present. No thyromegaly present.  Cardiovascular: Normal rate, regular rhythm, normal heart sounds and intact distal pulses.   Respiratory: Effort normal and breath sounds normal. No stridor. No respiratory distress. She has no wheezes.  GI: Soft. There is no tenderness. There is no guarding.  Musculoskeletal:       Right knee: She exhibits  decreased range of motion, swelling and bony tenderness. She exhibits no ecchymosis, no deformity, no laceration and no erythema. Tenderness found.  Lymphadenopathy:    She has no cervical adenopathy.  Neurological: She is alert and oriented to person, place, and time.  Skin: Skin is warm and dry.  Psychiatric: She has a normal mood and affect.      Labs:  Estimated body mass index is 40.75 kg/(m^2) as calculated from the following:   Height as of 02/22/15: 5\' 3"  (1.6 m).   Weight as of 02/12/15: 104.327 kg (230 lb).   Imaging Review Plain radiographs demonstrate severe degenerative joint disease of the right knee(s). The bone quality appears to be good for age and reported activity level.  Assessment/Plan:  End stage arthritis, right knee   The patient history, physical examination, clinical judgment of the provider and imaging studies are consistent with end stage degenerative joint disease of the right knee(s) and total knee arthroplasty is deemed medically necessary. The treatment options including medical management, injection therapy arthroscopy and arthroplasty were discussed at length. The risks and benefits of total knee arthroplasty were presented and reviewed. The risks due to aseptic loosening, infection, stiffness, patella tracking problems, thromboembolic complications and other imponderables were discussed. The patient acknowledged the explanation, agreed to proceed with the plan and consent was signed. Patient is being admitted for inpatient treatment for surgery, pain control, PT, OT, prophylactic antibiotics, VTE prophylaxis, progressive ambulation and ADL's and discharge planning. The patient is planning to be discharged home with home health services / SNF.      [place] Pugh Janaiyah Blackard   PA-C  09/26/2015, 10:45 PM

## 2015-10-10 NOTE — H&P (Signed)
TOTAL KNEE ADMISSION H&P  Patient is being admitted for right total knee arthroplasty.  Subjective:  Chief Complaint:   Right knee primary OA /pain  HPI: Jill Bond, 65 y.o. female, has a history of pain and functional disability in the right knee due to arthritis and has failed non-surgical conservative treatments for greater than 12 weeks to include NSAID's and/or analgesics, corticosteriod injections, viscosupplementation injections, use of assistive devices and activity modification.  Onset of symptoms was gradual, starting 3-4 years ago with gradually worsening course since that time. The patient noted prior procedures on the knee to include  arthroplasty on the left knee in [place], [place] in 2007.  Patient currently rates pain in the right knee(s) at 10 out of 10 with activity. Patient has night pain, worsening of pain with activity and weight bearing, pain that interferes with activities of daily living, pain with passive range of motion, crepitus and joint swelling.  Patient has evidence of periarticular osteophytes and joint space narrowing by imaging studies.  There is no active infection.  Risks, benefits and expectations were discussed with the patient.  Risks including but not limited to the risk of anesthesia, blood clots, nerve damage, blood vessel damage, failure of the prosthesis, infection and up to and including death.  Patient understand the risks, benefits and expectations and wishes to proceed with surgery.    PCP: Madelyn Brunner, MD  D/C Plans:      Home with HHPT  Post-op Meds:       No Rx given  Tranexamic Acid:      To be given - IV  Decadron:      Is to be given  FYI:     ASA  Norco     Past Medical History  Diagnosis Date  . Cancer ([place])     kidney  . Hypertension   . Shortness of breath dyspnea     with exertion  . Anxiety   . Depression   . Chronic kidney disease 2006    Left Nephrectomy  . GERD (gastroesophageal reflux disease)   .  Headache   . Arthritis     Past Surgical History  Procedure Laterality Date  . Breast biopsy Left 2003    neg  . Tonsillectomy    . Abdominal hysterectomy    . Joint replacement Left     Total Knee Replacement  . Shoulder arthroscopy w/ rotator cuff repair Left   . Ostectomy Left 02/22/2015    Procedure: OSTECTOMY;  Surgeon: Albertine Patricia, DPM;  Location: [facility] ORS;  Service: Podiatry;  Laterality: Left;  . Achilles tendon surgery Left 02/22/2015    Procedure: Secondary ACHILLES TENDON REPAIR;  Surgeon: Albertine Patricia, DPM;  Location: [facility] ORS;  Service: Podiatry;  Laterality: Left;    No prescriptions prior to admission   Allergies  Allergen Reactions  . Codeine Nausea And Vomiting  . Penicillins Hives and Other (See Comments)    Has patient had a PCN reaction causing immediate rash, facial/tongue/throat swelling, SOB or lightheadedness with hypotension: no Has patient had a PCN reaction causing severe rash involving mucus membranes or skin necrosis: no Has patient had a PCN reaction that required hospitalization no Has patient had a PCN reaction occurring within the last 10 years: no If all of the above answers are "NO", then may proceed with Cephalosporin use.     Social History  Substance Use Topics  . Smoking status: Never Smoker   . Smokeless tobacco: Never Used  .  Alcohol Use: No    Family History  Problem Relation Age of Onset  . Breast cancer Mother 58  . Heart failure Father   . Prostate cancer Father      Review of Systems  Constitutional: Negative.   HENT: Negative.   Eyes: Negative.   Respiratory: Positive for shortness of breath (on exertion).   Cardiovascular: Negative.   Gastrointestinal: Positive for heartburn.  Genitourinary: Negative.   Musculoskeletal: Positive for joint pain.  Skin: Negative.   Neurological: Negative.   Endo/Heme/Allergies: Negative.   Psychiatric/Behavioral: Positive for depression. The patient is nervous/anxious.      Objective:  Physical Exam  Constitutional: She is oriented to person, place, and time. She appears well-developed.  HENT:  Head: Normocephalic.  Eyes: Pupils are equal, round, and reactive to light.  Neck: Neck supple. No JVD present. No tracheal deviation present. No thyromegaly present.  Cardiovascular: Normal rate, regular rhythm, normal heart sounds and intact distal pulses.   Respiratory: Effort normal and breath sounds normal. No stridor. No respiratory distress. She has no wheezes.  GI: Soft. There is no tenderness. There is no guarding.  Musculoskeletal:       Right knee: She exhibits decreased range of motion, swelling and bony tenderness. She exhibits no ecchymosis, no deformity, no laceration and no erythema. Tenderness found.  Lymphadenopathy:    She has no cervical adenopathy.  Neurological: She is alert and oriented to person, place, and time.  Skin: Skin is warm and dry.  Psychiatric: She has a normal mood and affect.     Labs:   Estimated body mass index is 40.75 kg/(m^2) as calculated from the following:   Height as of 02/22/15: 5\' 3"  (1.6 m).   Weight as of 02/12/15: 104.327 kg (230 lb).   Imaging Review Plain radiographs demonstrate severe degenerative joint disease of the right knee(s).  The bone quality appears to be good for age and reported activity level.  Assessment/Plan:  End stage arthritis, right knee   The patient history, physical examination, clinical judgment of the provider and imaging studies are consistent with end stage degenerative joint disease of the right knee(s) and total knee arthroplasty is deemed medically necessary. The treatment options including medical management, injection therapy arthroscopy and arthroplasty were discussed at length. The risks and benefits of total knee arthroplasty were presented and reviewed. The risks due to aseptic loosening, infection, stiffness, patella tracking problems, thromboembolic complications and  other imponderables were discussed. The patient acknowledged the explanation, agreed to proceed with the plan and consent was signed. Patient is being admitted for inpatient treatment for surgery, pain control, PT, OT, prophylactic antibiotics, VTE prophylaxis, progressive ambulation and ADL's and discharge planning. The patient is planning to be discharged home with home health services.     West Pugh Rheta Hemmelgarn   PA-C  10/10/2015, 8:34 AM

## 2015-10-14 NOTE — Patient Instructions (Addendum)
ZARIYA LIMBERG  10/16/2015      Your procedure is scheduled on: 10-22-15  Report to [facility] Main  Entrance take [facility]  elevators to 3rd floor to  [place] at 955  AM.  Call this number if you have problems the morning of surgery [phone]   Remember: ONLY 1 PERSON MAY GO WITH YOU TO SHORT STAY TO GET  READY MORNING OF [place].  Do not eat food or drink liquids :After Midnight.     Take these medicines the morning of surgery with A SIP OF WATER: LORAZEPAM (ATIVAN) AS NEEDED              You may not have any metal on your body including hair pins and              piercings  Do not wear jewelry, make-up, lotions, powders or perfumes, deodorant             Do not wear nail polish.  Do not shave  48 hours prior to surgery.              Men may shave face and neck.   Do not bring valuables to the hospital. [place].  Contacts, dentures or bridgework may not be worn into surgery.  Leave suitcase in the car. After surgery it may be brought to your room.                 Please read over the following fact sheets you were given: _____________________________________________________________________             [facility] - Preparing for Surgery Before surgery, you can play an important role.  Because skin is not sterile, your skin needs to be as free of germs as possible.  You can reduce the number of germs on your skin by washing with CHG (chlorahexidine gluconate) soap before surgery.  CHG is an antiseptic cleaner which kills germs and bonds with the skin to continue killing germs even after washing. Please DO NOT use if you have an allergy to CHG or antibacterial soaps.  If your skin becomes reddened/irritated stop using the CHG and inform your nurse when you arrive at Short Stay. Do not shave (including legs and underarms) for at least 48 hours prior to the first CHG shower.  You  may shave your face/neck. Please follow these instructions carefully:  1.  Shower with CHG Soap the night before surgery and the  morning of Surgery.  2.  If you choose to wash your hair, wash your hair first as usual with your  normal  shampoo.  3.  After you shampoo, rinse your hair and body thoroughly to remove the  shampoo.                           4.  Use CHG as you would any other liquid soap.  You can apply chg directly  to the skin and wash                       Gently with a scrungie or clean washcloth.  5.  Apply the CHG Soap to your  body ONLY FROM THE NECK DOWN.   Do not use on face/ open                           Wound or open sores. Avoid contact with eyes, ears mouth and genitals (private parts).                       Wash face,  Genitals (private parts) with your normal soap.             6.  Wash thoroughly, paying special attention to the area where your surgery  will be performed.  7.  Thoroughly rinse your body with warm water from the neck down.  8.  DO NOT shower/wash with your normal soap after using and rinsing off  the CHG Soap.                9.  Pat yourself dry with a clean towel.            10.  Wear clean pajamas.            11.  Place clean sheets on your bed the night of your first shower and do not  sleep with pets. Day of Surgery : Do not apply any lotions/deodorants the morning of surgery.  Please wear clean clothes to the hospital/surgery center.  FAILURE TO FOLLOW THESE INSTRUCTIONS MAY RESULT IN THE CANCELLATION OF YOUR SURGERY PATIENT SIGNATURE_________________________________  NURSE SIGNATURE__________________________________  ________________________________________________________________________   Adam Phenix  An incentive spirometer is a tool that can help keep your lungs clear and active. This tool measures how well you are filling your lungs with each breath. Taking long deep breaths may help reverse or decrease the chance of  developing breathing (pulmonary) problems (especially infection) following:  A long period of time when you are unable to move or be active. BEFORE THE PROCEDURE   If the spirometer includes an indicator to show your best effort, your nurse or respiratory therapist will set it to a desired goal.  If possible, sit up straight or lean slightly forward. Try not to slouch.  Hold the incentive spirometer in an upright position. INSTRUCTIONS FOR USE   Sit on the edge of your bed if possible, or sit up as far as you can in bed or on a chair.  Hold the incentive spirometer in an upright position.  Breathe out normally.  Place the mouthpiece in your mouth and seal your lips tightly around it.  Breathe in slowly and as deeply as possible, raising the piston or the ball toward the top of the column.  Hold your breath for 3-5 seconds or for as long as possible. Allow the piston or ball to fall to the bottom of the column.  Remove the mouthpiece from your mouth and breathe out normally.  Rest for a few seconds and repeat Steps 1 through 7 at least 10 times every 1-2 hours when you are awake. Take your time and take a few normal breaths between deep breaths.  The spirometer may include an indicator to show your best effort. Use the indicator as a goal to work toward during each repetition.  After each set of 10 deep breaths, practice coughing to be sure your lungs are clear. If you have an incision (the cut made at the time of surgery), support your incision when coughing by placing a pillow or rolled up towels firmly against it.  Once you are able to get out of bed, walk around indoors and cough well. You may stop using the incentive spirometer when instructed by your caregiver.  RISKS AND COMPLICATIONS  Take your time so you do not get dizzy or light-headed.  If you are in pain, you may need to take or ask for pain medication before doing incentive spirometry. It is harder to take a deep  breath if you are having pain. AFTER USE  Rest and breathe slowly and easily.  It can be helpful to keep track of a log of your progress. Your caregiver can provide you with a simple table to help with this. If you are using the spirometer at home, follow these instructions: [place] IF:   You are having difficultly using the spirometer.  You have trouble using the spirometer as often as instructed.  Your pain medication is not giving enough relief while using the spirometer.  You develop fever of 100.5 F (38.1 C) or higher. SEEK IMMEDIATE MEDICAL CARE IF:   You cough up bloody sputum that had not been present before.  You develop fever of 102 F (38.9 C) or greater.  You develop worsening pain at or near the incision site. MAKE SURE YOU:   Understand these instructions.  Will watch your condition.  Will get help right away if you are not doing well or get worse. Document Released: 09/07/2006 Document Revised: 07/20/2011 Document Reviewed: 11/08/2006 ExitCare Patient Information 2014 ExitCare, [place].   ________________________________________________________________________  WHAT IS A BLOOD TRANSFUSION? Blood Transfusion Information  A transfusion is the replacement of blood or some of its parts. Blood is made up of multiple cells which provide different functions.  Red blood cells carry oxygen and are used for blood loss replacement.  White blood cells fight against infection.  Platelets control bleeding.  Plasma helps clot blood.  Other blood products are available for specialized needs, such as hemophilia or other clotting disorders. BEFORE THE TRANSFUSION  Who gives blood for transfusions?   Healthy volunteers who are fully evaluated to make sure their blood is safe. This is blood bank blood. Transfusion therapy is the safest it has ever been in the practice of medicine. Before blood is taken from a donor, a complete history is taken to make sure  that person has no history of diseases nor engages in risky social behavior (examples are intravenous drug use or sexual activity with multiple partners). The donor's travel history is screened to minimize risk of transmitting infections, such as malaria. The donated blood is tested for signs of infectious diseases, such as HIV and hepatitis. The blood is then tested to be sure it is compatible with you in order to minimize the chance of a transfusion reaction. If you or a relative donates blood, this is often done in anticipation of surgery and is not appropriate for emergency situations. It takes many days to process the donated blood. RISKS AND COMPLICATIONS Although transfusion therapy is very safe and saves many lives, the main dangers of transfusion include:   Getting an infectious disease.  Developing a transfusion reaction. This is an allergic reaction to something in the blood you were given. Every precaution is taken to prevent this. The decision to have a blood transfusion has been considered carefully by your caregiver before blood is given. Blood is not given unless the benefits outweigh the risks. AFTER THE TRANSFUSION  Right after receiving a blood transfusion, you will usually feel much better and more energetic.  This is especially true if your red blood cells have gotten low (anemic). The transfusion raises the level of the red blood cells which carry oxygen, and this usually causes an energy increase.  The nurse administering the transfusion will monitor you carefully for complications. HOME CARE INSTRUCTIONS  No special instructions are needed after a transfusion. You may find your energy is better. Speak with your caregiver about any limitations on activity for underlying diseases you may have. SEEK MEDICAL CARE IF:   Your condition is not improving after your transfusion.  You develop redness or irritation at the intravenous (IV) site. SEEK IMMEDIATE MEDICAL CARE IF:  Any of  the following symptoms occur over the next 12 hours:  Shaking chills.  You have a temperature by mouth above 102 F (38.9 C), not controlled by medicine.  Chest, back, or muscle pain.  People around you feel you are not acting correctly or are confused.  Shortness of breath or difficulty breathing.  Dizziness and fainting.  You get a rash or develop hives.  You have a decrease in urine output.  Your urine turns a dark color or changes to pink, red, or brown. Any of the following symptoms occur over the next 10 days:  You have a temperature by mouth above 102 F (38.9 C), not controlled by medicine.  Shortness of breath.  Weakness after normal activity.  The white part of the eye turns yellow (jaundice).  You have a decrease in the amount of urine or are urinating less often.  Your urine turns a dark color or changes to pink, red, or brown. Document Released: 04/24/2000 Document Revised: 07/20/2011 Document Reviewed: 12/12/2007 [facility] Patient Information 2014 [place], [place].  _______________________________________________________________________

## 2015-10-14 NOTE — Progress Notes (Signed)
MEDICAL CLEARANCE NOTE DR Gilford Rile 08-14-15 ON CHART EKG 02-12-15 EPIC

## 2015-10-16 ENCOUNTER — Encounter (HOSPITAL_COMMUNITY): Payer: Self-pay

## 2015-10-16 ENCOUNTER — Encounter (HOSPITAL_COMMUNITY)
Admission: RE | Admit: 2015-10-16 | Discharge: 2015-10-16 | Disposition: A | Discharge status: Home / Self Care | Payer: Commercial Managed Care - HMO | Source: Ambulatory Visit | Attending: Orthopedic Surgery | Admitting: Orthopedic Surgery

## 2015-10-16 DIAGNOSIS — N189 Chronic kidney disease, unspecified: Secondary | ICD-10-CM | POA: Insufficient documentation

## 2015-10-16 DIAGNOSIS — Z905 Acquired absence of kidney: Secondary | ICD-10-CM | POA: Insufficient documentation

## 2015-10-16 DIAGNOSIS — Z0183 Encounter for blood typing: Secondary | ICD-10-CM | POA: Diagnosis not present

## 2015-10-16 DIAGNOSIS — Z885 Allergy status to narcotic agent status: Secondary | ICD-10-CM | POA: Insufficient documentation

## 2015-10-16 DIAGNOSIS — Z01812 Encounter for preprocedural laboratory examination: Secondary | ICD-10-CM | POA: Insufficient documentation

## 2015-10-16 DIAGNOSIS — Z85528 Personal history of other malignant neoplasm of kidney: Secondary | ICD-10-CM | POA: Insufficient documentation

## 2015-10-16 DIAGNOSIS — M1711 Unilateral primary osteoarthritis, right knee: Secondary | ICD-10-CM | POA: Insufficient documentation

## 2015-10-16 DIAGNOSIS — Z880 Allergy status to penicillin: Secondary | ICD-10-CM | POA: Insufficient documentation

## 2015-10-16 DIAGNOSIS — K219 Gastro-esophageal reflux disease without esophagitis: Secondary | ICD-10-CM | POA: Insufficient documentation

## 2015-10-16 DIAGNOSIS — I129 Hypertensive chronic kidney disease with stage 1 through stage 4 chronic kidney disease, or unspecified chronic kidney disease: Secondary | ICD-10-CM | POA: Insufficient documentation

## 2015-10-16 HISTORY — DX: Unspecified disorder of synovium and tendon, unspecified shoulder: M67.919

## 2015-10-16 LAB — SURGICAL PCR SCREEN
MRSA, PCR: NEGATIVE
Staphylococcus aureus: NEGATIVE

## 2015-10-16 LAB — CBC
HCT: 42.5 % (ref 36.0–46.0)
Hemoglobin: 13.6 g/dL (ref 12.0–15.0)
MCH: 29.4 pg (ref 26.0–34.0)
MCHC: 32 g/dL (ref 30.0–36.0)
MCV: 91.8 fL (ref 78.0–100.0)
PLATELETS: 181 10*3/uL (ref 150–400)
RBC: 4.63 MIL/uL (ref 3.87–5.11)
RDW: 14.2 % (ref 11.5–15.5)
WBC: 5.6 10*3/uL (ref 4.0–10.5)

## 2015-10-16 LAB — BASIC METABOLIC PANEL
Anion gap: 6 (ref 5–15)
BUN: 22 mg/dL — ABNORMAL HIGH (ref 6–20)
CALCIUM: 10.2 mg/dL (ref 8.9–10.3)
CO2: 29 mmol/L (ref 22–32)
CREATININE: 0.86 mg/dL (ref 0.44–1.00)
Chloride: 105 mmol/L (ref 101–111)
Glucose, Bld: 126 mg/dL — ABNORMAL HIGH (ref 65–99)
Potassium: 3.8 mmol/L (ref 3.5–5.1)
SODIUM: 140 mmol/L (ref 135–145)

## 2015-10-16 LAB — ABO/RH: ABO/RH(D): O NEG

## 2015-10-16 NOTE — Progress Notes (Signed)
   10/16/15 0956  OBSTRUCTIVE SLEEP APNEA  Have you ever been diagnosed with sleep apnea through a sleep study? No  Do you snore loudly (loud enough to be heard through closed doors)?  1  Do you often feel tired, fatigued, or sleepy during the daytime (such as falling asleep during driving or talking to someone)? 1  Has anyone observed you stop breathing during your sleep? 0  Do you have, or are you being treated for high blood pressure? 1  BMI more than 35 kg/m2? 1  Age > 50 (1-yes) 1  Neck circumference greater than:Female 16 inches or larger, Female 17inches or larger? 0  Female Gender (Yes=1) 0  Obstructive Sleep Apnea Score 5  Score 5 or greater  Results sent to PCP

## 2015-10-21 MED ORDER — VANCOMYCIN HCL 10 G IV SOLR
1500.0000 mg | INTRAVENOUS | Status: AC
  Administered 2015-10-22: 1500 mg via INTRAVENOUS
  Filled 2015-10-21: qty 1500

## 2015-10-22 ENCOUNTER — Encounter (HOSPITAL_COMMUNITY): Payer: Self-pay

## 2015-10-22 ENCOUNTER — Inpatient Hospital Stay (HOSPITAL_COMMUNITY): Payer: Commercial Managed Care - HMO | Admitting: Anesthesiology

## 2015-10-22 ENCOUNTER — Encounter (HOSPITAL_COMMUNITY)
Admission: RE | Disposition: A | Discharge status: Home Health | Payer: Self-pay | Source: Ambulatory Visit | Attending: Orthopedic Surgery

## 2015-10-22 ENCOUNTER — Inpatient Hospital Stay (HOSPITAL_COMMUNITY)
Admission: RE | Admit: 2015-10-22 | Discharge: 2015-10-23 | DRG: 470 | Disposition: A | Discharge status: Home Health | Payer: Commercial Managed Care - HMO | Source: Ambulatory Visit | Attending: Orthopedic Surgery | Admitting: Orthopedic Surgery

## 2015-10-22 DIAGNOSIS — Z96659 Presence of unspecified artificial knee joint: Secondary | ICD-10-CM

## 2015-10-22 DIAGNOSIS — Z885 Allergy status to narcotic agent status: Secondary | ICD-10-CM | POA: Diagnosis not present

## 2015-10-22 DIAGNOSIS — M1711 Unilateral primary osteoarthritis, right knee: Principal | ICD-10-CM | POA: Diagnosis present

## 2015-10-22 DIAGNOSIS — M25761 Osteophyte, right knee: Secondary | ICD-10-CM | POA: Diagnosis not present

## 2015-10-22 DIAGNOSIS — M659 Synovitis and tenosynovitis, unspecified: Secondary | ICD-10-CM | POA: Diagnosis not present

## 2015-10-22 DIAGNOSIS — Z8249 Family history of ischemic heart disease and other diseases of the circulatory system: Secondary | ICD-10-CM

## 2015-10-22 DIAGNOSIS — Z96651 Presence of right artificial knee joint: Secondary | ICD-10-CM | POA: Insufficient documentation

## 2015-10-22 DIAGNOSIS — N189 Chronic kidney disease, unspecified: Secondary | ICD-10-CM | POA: Diagnosis present

## 2015-10-22 DIAGNOSIS — M25461 Effusion, right knee: Secondary | ICD-10-CM | POA: Diagnosis not present

## 2015-10-22 DIAGNOSIS — M25561 Pain in right knee: Secondary | ICD-10-CM | POA: Diagnosis present

## 2015-10-22 DIAGNOSIS — I10 Essential (primary) hypertension: Secondary | ICD-10-CM | POA: Diagnosis not present

## 2015-10-22 DIAGNOSIS — Z6838 Body mass index (BMI) 38.0-38.9, adult: Secondary | ICD-10-CM

## 2015-10-22 DIAGNOSIS — K219 Gastro-esophageal reflux disease without esophagitis: Secondary | ICD-10-CM | POA: Diagnosis not present

## 2015-10-22 DIAGNOSIS — F329 Major depressive disorder, single episode, unspecified: Secondary | ICD-10-CM | POA: Diagnosis present

## 2015-10-22 DIAGNOSIS — Z880 Allergy status to penicillin: Secondary | ICD-10-CM

## 2015-10-22 DIAGNOSIS — F419 Anxiety disorder, unspecified: Secondary | ICD-10-CM | POA: Diagnosis present

## 2015-10-22 DIAGNOSIS — I129 Hypertensive chronic kidney disease with stage 1 through stage 4 chronic kidney disease, or unspecified chronic kidney disease: Secondary | ICD-10-CM | POA: Diagnosis not present

## 2015-10-22 DIAGNOSIS — E669 Obesity, unspecified: Secondary | ICD-10-CM | POA: Diagnosis not present

## 2015-10-22 DIAGNOSIS — M179 Osteoarthritis of knee, unspecified: Secondary | ICD-10-CM | POA: Diagnosis not present

## 2015-10-22 HISTORY — DX: Other complications of anesthesia, initial encounter: T88.59XA

## 2015-10-22 HISTORY — PX: TOTAL KNEE ARTHROPLASTY: SHX125

## 2015-10-22 HISTORY — DX: Nausea with vomiting, unspecified: R11.2

## 2015-10-22 HISTORY — DX: Adverse effect of unspecified anesthetic, initial encounter: T41.45XA

## 2015-10-22 HISTORY — DX: Other specified postprocedural states: Z98.890

## 2015-10-22 LAB — TYPE AND SCREEN
ABO/RH(D): O NEG
ANTIBODY SCREEN: NEGATIVE

## 2015-10-22 SURGERY — ARTHROPLASTY, KNEE, TOTAL
Anesthesia: Spinal | Site: Knee | Laterality: Right

## 2015-10-22 MED ORDER — METOCLOPRAMIDE HCL 10 MG PO TABS: 5.0000 mg | ORAL_TABLET | Freq: Three times a day (TID) | ORAL | Status: DC | PRN

## 2015-10-22 MED ORDER — MENTHOL 3 MG MT LOZG: 1.0000 | LOZENGE | OROMUCOSAL | Status: DC | PRN

## 2015-10-22 MED ORDER — HYDROMORPHONE HCL 1 MG/ML IJ SOLN
0.2500 mg | INTRAMUSCULAR | Status: DC | PRN
  Administered 2015-10-22 (×4): 0.5 mg via INTRAVENOUS

## 2015-10-22 MED ORDER — METHOCARBAMOL 500 MG PO TABS
500.0000 mg | ORAL_TABLET | Freq: Four times a day (QID) | ORAL | Status: DC | PRN
  Administered 2015-10-22 – 2015-10-23 (×2): 500 mg via ORAL
  Filled 2015-10-22 (×2): qty 1

## 2015-10-22 MED ORDER — METOCLOPRAMIDE HCL 5 MG/ML IJ SOLN
5.0000 mg | Freq: Three times a day (TID) | INTRAMUSCULAR | Status: DC | PRN
  Administered 2015-10-23: 10 mg via INTRAVENOUS
  Filled 2015-10-22: qty 2

## 2015-10-22 MED ORDER — OMEPRAZOLE 20 MG PO CPDR
20.0000 mg | DELAYED_RELEASE_CAPSULE | Freq: Every day | ORAL | Status: DC
  Administered 2015-10-22: 20 mg via ORAL
  Filled 2015-10-22 (×2): qty 1

## 2015-10-22 MED ORDER — DEXAMETHASONE SODIUM PHOSPHATE 10 MG/ML IJ SOLN
INTRAMUSCULAR | Status: DC | PRN
  Administered 2015-10-22: 10 mg via INTRAVENOUS

## 2015-10-22 MED ORDER — 0.9 % SODIUM CHLORIDE (POUR BTL) OPTIME
TOPICAL | Status: DC | PRN
  Administered 2015-10-22: 1000 mL

## 2015-10-22 MED ORDER — PROPOFOL 500 MG/50ML IV EMUL
INTRAVENOUS | Status: DC | PRN
  Administered 2015-10-22: 50 ug/kg/min via INTRAVENOUS

## 2015-10-22 MED ORDER — STERILE WATER FOR IRRIGATION IR SOLN
Status: DC | PRN
  Administered 2015-10-22: 2000 mL

## 2015-10-22 MED ORDER — HYDROCHLOROTHIAZIDE 25 MG PO TABS
25.0000 mg | ORAL_TABLET | Freq: Every day | ORAL | Status: DC
  Administered 2015-10-22: 25 mg via ORAL
  Filled 2015-10-22 (×2): qty 1

## 2015-10-22 MED ORDER — DEXAMETHASONE SODIUM PHOSPHATE 10 MG/ML IJ SOLN
10.0000 mg | Freq: Once | INTRAMUSCULAR | Status: AC
  Administered 2015-10-23: 10 mg via INTRAVENOUS
  Filled 2015-10-22: qty 1

## 2015-10-22 MED ORDER — ASPIRIN EC 325 MG PO TBEC
325.0000 mg | DELAYED_RELEASE_TABLET | Freq: Two times a day (BID) | ORAL | Status: DC
  Administered 2015-10-23: 325 mg via ORAL
  Filled 2015-10-22 (×3): qty 1

## 2015-10-22 MED ORDER — ONDANSETRON HCL 4 MG PO TABS
4.0000 mg | ORAL_TABLET | Freq: Four times a day (QID) | ORAL | Status: DC | PRN
  Administered 2015-10-22 – 2015-10-23 (×2): 4 mg via ORAL
  Filled 2015-10-22 (×2): qty 1

## 2015-10-22 MED ORDER — TRANEXAMIC ACID 1000 MG/10ML IV SOLN
1000.0000 mg | Freq: Once | INTRAVENOUS | Status: AC
  Administered 2015-10-22: 1000 mg via INTRAVENOUS
  Filled 2015-10-22: qty 10

## 2015-10-22 MED ORDER — MIDAZOLAM HCL 2 MG/2ML IJ SOLN
INTRAMUSCULAR | Status: AC
  Filled 2015-10-22: qty 2

## 2015-10-22 MED ORDER — SODIUM CHLORIDE 0.9 % IJ SOLN
INTRAMUSCULAR | Status: DC | PRN
  Administered 2015-10-22: 30 mL

## 2015-10-22 MED ORDER — FERROUS SULFATE 325 (65 FE) MG PO TABS
325.0000 mg | ORAL_TABLET | Freq: Three times a day (TID) | ORAL | Status: DC
  Administered 2015-10-23: 325 mg via ORAL
  Filled 2015-10-22 (×5): qty 1

## 2015-10-22 MED ORDER — PHENYLEPHRINE HCL 10 MG/ML IJ SOLN
INTRAMUSCULAR | Status: DC | PRN
  Administered 2015-10-22 (×2): 40 ug via INTRAVENOUS
  Administered 2015-10-22: 120 ug via INTRAVENOUS
  Administered 2015-10-22 (×2): 80 ug via INTRAVENOUS
  Administered 2015-10-22: 120 ug via INTRAVENOUS
  Administered 2015-10-22 (×3): 80 ug via INTRAVENOUS

## 2015-10-22 MED ORDER — BUPIVACAINE HCL (PF) 0.75 % IJ SOLN
INTRAMUSCULAR | Status: DC | PRN
  Administered 2015-10-22: 15 mg via INTRATHECAL

## 2015-10-22 MED ORDER — SODIUM CHLORIDE 0.9 % IR SOLN
Status: DC | PRN
  Administered 2015-10-22: 1000 mL

## 2015-10-22 MED ORDER — DIPHENHYDRAMINE HCL 25 MG PO CAPS: 25.0000 mg | ORAL_CAPSULE | Freq: Four times a day (QID) | ORAL | Status: DC | PRN

## 2015-10-22 MED ORDER — LACTATED RINGERS IV SOLN: INTRAVENOUS | Status: DC

## 2015-10-22 MED ORDER — HYDROMORPHONE HCL 1 MG/ML IJ SOLN
INTRAMUSCULAR | Status: AC
  Filled 2015-10-22: qty 1

## 2015-10-22 MED ORDER — TRAZODONE HCL 50 MG PO TABS: 50.0000 mg | ORAL_TABLET | Freq: Every evening | ORAL | Status: DC | PRN

## 2015-10-22 MED ORDER — POLYETHYLENE GLYCOL 3350 17 G PO PACK
17.0000 g | PACK | Freq: Two times a day (BID) | ORAL | Status: DC
  Filled 2015-10-22 (×3): qty 1

## 2015-10-22 MED ORDER — BUPIVACAINE-EPINEPHRINE (PF) 0.25% -1:200000 IJ SOLN
INTRAMUSCULAR | Status: DC | PRN
  Administered 2015-10-22: 30 mL

## 2015-10-22 MED ORDER — SODIUM CHLORIDE 0.9 % IJ SOLN
INTRAMUSCULAR | Status: AC
  Filled 2015-10-22: qty 50

## 2015-10-22 MED ORDER — MAGNESIUM CITRATE PO SOLN: 1.0000 | Freq: Once | ORAL | Status: DC | PRN

## 2015-10-22 MED ORDER — ONDANSETRON HCL 4 MG/2ML IJ SOLN: 4.0000 mg | Freq: Four times a day (QID) | INTRAMUSCULAR | Status: DC | PRN

## 2015-10-22 MED ORDER — PHENOL 1.4 % MT LIQD: 1.0000 | OROMUCOSAL | Status: DC | PRN

## 2015-10-22 MED ORDER — BISACODYL 10 MG RE SUPP: 10.0000 mg | Freq: Every day | RECTAL | Status: DC | PRN

## 2015-10-22 MED ORDER — VANCOMYCIN HCL 10 G IV SOLR
1500.0000 mg | Freq: Two times a day (BID) | INTRAVENOUS | Status: AC
  Administered 2015-10-22: 1500 mg via INTRAVENOUS
  Filled 2015-10-22: qty 1500

## 2015-10-22 MED ORDER — CHLORHEXIDINE GLUCONATE 4 % EX LIQD: 60.0000 mL | Freq: Once | CUTANEOUS | Status: DC

## 2015-10-22 MED ORDER — LORAZEPAM 0.5 MG PO TABS: 0.5000 mg | ORAL_TABLET | Freq: Three times a day (TID) | ORAL | Status: DC | PRN

## 2015-10-22 MED ORDER — PROPOFOL 10 MG/ML IV BOLUS
INTRAVENOUS | Status: AC
  Filled 2015-10-22: qty 20

## 2015-10-22 MED ORDER — CELECOXIB 200 MG PO CAPS
200.0000 mg | ORAL_CAPSULE | Freq: Two times a day (BID) | ORAL | Status: DC
  Administered 2015-10-22 – 2015-10-23 (×2): 200 mg via ORAL
  Filled 2015-10-22 (×3): qty 1

## 2015-10-22 MED ORDER — SODIUM CHLORIDE 0.9 % IV SOLN
INTRAVENOUS | Status: DC
  Administered 2015-10-22: 17:00:00 via INTRAVENOUS
  Filled 2015-10-22 (×4): qty 1000

## 2015-10-22 MED ORDER — PROPOFOL 10 MG/ML IV BOLUS
INTRAVENOUS | Status: DC | PRN
  Administered 2015-10-22: 50 mg via INTRAVENOUS
  Administered 2015-10-22: 30 mg via INTRAVENOUS
  Administered 2015-10-22: 20 mg via INTRAVENOUS

## 2015-10-22 MED ORDER — DOCUSATE SODIUM 100 MG PO CAPS
100.0000 mg | ORAL_CAPSULE | Freq: Two times a day (BID) | ORAL | Status: DC
  Administered 2015-10-22 – 2015-10-23 (×2): 100 mg via ORAL
  Filled 2015-10-22 (×4): qty 1

## 2015-10-22 MED ORDER — HYDROMORPHONE HCL 1 MG/ML IJ SOLN
0.5000 mg | INTRAMUSCULAR | Status: DC | PRN
  Administered 2015-10-22 (×2): 1 mg via INTRAVENOUS
  Filled 2015-10-22 (×2): qty 1

## 2015-10-22 MED ORDER — METHOCARBAMOL 1000 MG/10ML IJ SOLN
500.0000 mg | Freq: Four times a day (QID) | INTRAVENOUS | Status: DC | PRN
  Administered 2015-10-22: 500 mg via INTRAVENOUS
  Filled 2015-10-22: qty 550
  Filled 2015-10-22: qty 5

## 2015-10-22 MED ORDER — BUPIVACAINE-EPINEPHRINE 0.25% -1:200000 IJ SOLN
INTRAMUSCULAR | Status: AC
  Filled 2015-10-22: qty 1

## 2015-10-22 MED ORDER — ALUM & MAG HYDROXIDE-SIMETH 200-200-20 MG/5ML PO SUSP: 30.0000 mL | ORAL | Status: DC | PRN

## 2015-10-22 MED ORDER — DEXAMETHASONE SODIUM PHOSPHATE 10 MG/ML IJ SOLN: 10.0000 mg | Freq: Once | INTRAMUSCULAR | Status: DC

## 2015-10-22 MED ORDER — FENTANYL CITRATE (PF) 100 MCG/2ML IJ SOLN
INTRAMUSCULAR | Status: DC | PRN
  Administered 2015-10-22 (×4): 25 ug via INTRAVENOUS

## 2015-10-22 MED ORDER — HYDROMORPHONE HCL 1 MG/ML IJ SOLN
INTRAMUSCULAR | Status: AC
  Administered 2015-10-22: 1 mg via INTRAVENOUS
  Filled 2015-10-22: qty 1

## 2015-10-22 MED ORDER — AMLODIPINE BESYLATE 10 MG PO TABS
10.0000 mg | ORAL_TABLET | Freq: Every day | ORAL | Status: DC
  Administered 2015-10-22: 10 mg via ORAL
  Filled 2015-10-22 (×2): qty 1

## 2015-10-22 MED ORDER — LACTATED RINGERS IV SOLN
INTRAVENOUS | Status: DC
  Administered 2015-10-22: 1000 mL via INTRAVENOUS

## 2015-10-22 MED ORDER — KETOROLAC TROMETHAMINE 30 MG/ML IJ SOLN
INTRAMUSCULAR | Status: DC | PRN
  Administered 2015-10-22: 30 mg

## 2015-10-22 MED ORDER — METOCLOPRAMIDE HCL 5 MG/ML IJ SOLN
INTRAMUSCULAR | Status: DC | PRN
  Administered 2015-10-22: 10 mg via INTRAVENOUS

## 2015-10-22 MED ORDER — METOPROLOL SUCCINATE ER 100 MG PO TB24
100.0000 mg | ORAL_TABLET | Freq: Every day | ORAL | Status: DC
  Administered 2015-10-22: 100 mg via ORAL
  Filled 2015-10-22 (×2): qty 1

## 2015-10-22 MED ORDER — LACTATED RINGERS IV SOLN
INTRAVENOUS | Status: DC
  Administered 2015-10-22 (×2): via INTRAVENOUS

## 2015-10-22 MED ORDER — HYDROCODONE-ACETAMINOPHEN 7.5-325 MG PO TABS
1.0000 | ORAL_TABLET | ORAL | Status: DC
  Administered 2015-10-22 – 2015-10-23 (×5): 2 via ORAL
  Filled 2015-10-22 (×5): qty 2

## 2015-10-22 MED ORDER — MIDAZOLAM HCL 5 MG/5ML IJ SOLN
INTRAMUSCULAR | Status: DC | PRN
  Administered 2015-10-22 (×2): 1 mg via INTRAVENOUS

## 2015-10-22 MED ORDER — ONDANSETRON HCL 4 MG/2ML IJ SOLN
INTRAMUSCULAR | Status: DC | PRN
  Administered 2015-10-22: 4 mg via INTRAVENOUS

## 2015-10-22 MED ORDER — KETOROLAC TROMETHAMINE 30 MG/ML IJ SOLN
INTRAMUSCULAR | Status: AC
  Filled 2015-10-22: qty 1

## 2015-10-22 MED ORDER — LIDOCAINE HCL (CARDIAC) 20 MG/ML IV SOLN
INTRAVENOUS | Status: DC | PRN
  Administered 2015-10-22: 50 mg via INTRAVENOUS

## 2015-10-22 MED ORDER — DEXTROSE 5 % IV SOLN
10.0000 mg | INTRAVENOUS | Status: DC | PRN
  Administered 2015-10-22: 50 ug/min via INTRAVENOUS

## 2015-10-22 SURGICAL SUPPLY — 45 items
BAG ZIPLOCK 12X15 (MISCELLANEOUS) ×3 IMPLANT
BANDAGE ACE 6X5 VEL STRL LF (GAUZE/BANDAGES/DRESSINGS) ×3 IMPLANT
BLADE SAW SGTL 13.0X1.19X90.0M (BLADE) ×3 IMPLANT
BOWL SMART MIX CTS (DISPOSABLE) ×3 IMPLANT
CAPT KNEE TOTAL 3 ATTUNE ×3 IMPLANT
CEMENT HV SMART SET (Cement) ×6 IMPLANT
CLOTH BEACON ORANGE TIMEOUT ST (SAFETY) ×3 IMPLANT
CUFF TOURN SGL QUICK 34 (TOURNIQUET CUFF) ×2
CUFF TRNQT CYL 34X4X40X1 (TOURNIQUET CUFF) ×1 IMPLANT
DECANTER SPIKE VIAL GLASS SM (MISCELLANEOUS) ×3 IMPLANT
DRAPE U-SHAPE 47X51 STRL (DRAPES) ×3 IMPLANT
DRESSING AQUACEL AG SP 3.5X10 (GAUZE/BANDAGES/DRESSINGS) ×1 IMPLANT
DRSG AQUACEL AG ADV 3.5X10 (GAUZE/BANDAGES/DRESSINGS) ×3 IMPLANT
DRSG AQUACEL AG SP 3.5X10 (GAUZE/BANDAGES/DRESSINGS) ×3
DURAPREP 26ML APPLICATOR (WOUND CARE) ×6 IMPLANT
ELECT REM PT RETURN 9FT ADLT (ELECTROSURGICAL) ×3
ELECTRODE REM PT RTRN 9FT ADLT (ELECTROSURGICAL) ×1 IMPLANT
GLOVE BIOGEL PI IND STRL 7.5 (GLOVE) ×6 IMPLANT
GLOVE BIOGEL PI IND STRL 8 (GLOVE) ×2 IMPLANT
GLOVE BIOGEL PI IND STRL 8.5 (GLOVE) ×2 IMPLANT
GLOVE BIOGEL PI INDICATOR 7.5 (GLOVE) ×12
GLOVE BIOGEL PI INDICATOR 8 (GLOVE) ×4
GLOVE BIOGEL PI INDICATOR 8.5 (GLOVE) ×4
GLOVE ECLIPSE 8.0 STRL XLNG CF (GLOVE) ×6 IMPLANT
GLOVE ORTHO TXT STRL SZ7.5 (GLOVE) ×6 IMPLANT
GLOVE SURG SS PI 8.0 STRL IVOR (GLOVE) ×6 IMPLANT
GOWN STRL REUS W/TWL 2XL LVL3 (GOWN DISPOSABLE) ×6 IMPLANT
GOWN STRL REUS W/TWL LRG LVL3 (GOWN DISPOSABLE) ×3 IMPLANT
GOWN STRL REUS W/TWL XL LVL3 (GOWN DISPOSABLE) ×6 IMPLANT
HANDPIECE INTERPULSE COAX TIP (DISPOSABLE) ×2
LIQUID BAND (GAUZE/BANDAGES/DRESSINGS) ×3 IMPLANT
MANIFOLD NEPTUNE II (INSTRUMENTS) ×3 IMPLANT
PACK TOTAL KNEE CUSTOM (KITS) ×3 IMPLANT
POSITIONER SURGICAL ARM (MISCELLANEOUS) ×3 IMPLANT
SET HNDPC FAN SPRY TIP SCT (DISPOSABLE) ×1 IMPLANT
SET PAD KNEE POSITIONER (MISCELLANEOUS) ×3 IMPLANT
SUT MNCRL AB 4-0 PS2 18 (SUTURE) ×3 IMPLANT
SUT VIC AB 1 CT1 36 (SUTURE) ×3 IMPLANT
SUT VIC AB 2-0 CT1 27 (SUTURE) ×6
SUT VIC AB 2-0 CT1 TAPERPNT 27 (SUTURE) ×3 IMPLANT
SUT VLOC 180 0 24IN GS25 (SUTURE) ×3 IMPLANT
SYR 50ML LL SCALE MARK (SYRINGE) ×3 IMPLANT
TRAY FOLEY W/METER SILVER 14FR (SET/KITS/TRAYS/PACK) ×3 IMPLANT
WRAP KNEE MAXI GEL POST OP (GAUZE/BANDAGES/DRESSINGS) ×3 IMPLANT
YANKAUER SUCT BULB TIP 10FT TU (MISCELLANEOUS) ×3 IMPLANT

## 2015-10-22 NOTE — Anesthesia Postprocedure Evaluation (Signed)
Anesthesia Post Note  Patient: Jill Bond  Procedure(s) Performed: Procedure(s) (LRB): RIGHT TOTAL KNEE ARTHROPLASTY (Right)  Patient location during evaluation: PACU Anesthesia Type: Spinal Level of consciousness: awake and alert Pain management: pain level controlled Vital Signs Assessment: post-procedure vital signs reviewed and stable Respiratory status: spontaneous breathing, nonlabored ventilation, respiratory function stable and patient connected to nasal cannula oxygen Cardiovascular status: blood pressure returned to baseline and stable Postop Assessment: no signs of nausea or vomiting Anesthetic complications: no    Last Vitals:  Filed Vitals:   10/22/15 1617 10/22/15 1734  BP: 175/66 138/71  Pulse: 62 76  Temp: 36.3 C 36.6 C  Resp: 15 14    Last Pain:  Filed Vitals:   10/22/15 1735  PainSc: 1                  Yvan Dority L

## 2015-10-22 NOTE — Anesthesia Procedure Notes (Signed)
Spinal Patient location during procedure: OR Start time: 10/22/2015 12:37 PM End time: 10/22/2015 12:42 PM Staffing Performed by: anesthesiologist  Preanesthetic Checklist Completed: patient identified, site marked, surgical consent, pre-op evaluation, timeout performed, IV checked, risks and benefits discussed and monitors and equipment checked Spinal Block Patient position: sitting Prep: Betadine Patient monitoring: heart rate, continuous pulse ox and blood pressure Approach: midline Location: L3-4 Injection technique: single-shot Needle Needle type: Pencan  Needle gauge: 24 G Needle length: 9 cm Assessment Sensory level: T6 Additional Notes Expiration date of kit checked and confirmed. Patient tolerated procedure well, without complications.

## 2015-10-22 NOTE — Op Note (Signed)
NAME:  Jill Bond                      MEDICAL RECORD NO.:  [id number]                             FACILITY:  [facility]      PHYSICIAN:  Pietro Cassis. Alvan Dame, M.D.  DATE OF BIRTH:  [birth year]      DATE OF PROCEDURE:  10/22/2015                                     OPERATIVE REPORT         PREOPERATIVE DIAGNOSIS:  Right knee osteoarthritis.      POSTOPERATIVE DIAGNOSIS:  Right knee osteoarthritis.      FINDINGS:  The patient was noted to have complete loss of cartilage and   bone-on-bone arthritis with associated osteophytes in the medial and patellofemoral compartments of   the knee with a significant synovitis and associated effusion.      PROCEDURE:  Right total knee replacement.      COMPONENTS USED:  DePuy Attune rotating platform posterior stabilized knee   system, a size 5N femur, 4 tibia, size 8 mm PS AOX insert, and 35 anatomic patellar   button.      SURGEON:  Pietro Cassis. Alvan Dame, M.D.      ASSISTANT:  Danae Orleans, PA-C      ANESTHESIA:  Spinal.      SPECIMENS:  None.      COMPLICATION:  None.      DRAINS:  None  EBL: <50cc      TOURNIQUET TIME:   Total Tourniquet Time Documented: Thigh (Right) - 28 minutes Total: Thigh (Right) - 28 minutes      The patient was stable to the recovery room.      INDICATION FOR PROCEDURE:  Jill Bond is a 65 y.o. female patient of   mine.  The patient had been seen, evaluated, and treated conservatively in the   office with medication, activity modification, and injections.  The patient had   radiographic changes of bone-on-bone arthritis with endplate sclerosis and osteophytes noted.      The patient failed conservative measures including medication, injections, and activity modification, and at this point was ready for more definitive measures.   Based on the radiographic changes and failed conservative measures, the patient   decided to proceed with total knee replacement.  Risks of infection,   DVT, component failure,  need for revision surgery, postop course, and   expectations were all   discussed and reviewed.  Consent was obtained for benefit of pain   relief.      PROCEDURE IN DETAIL:  The patient was brought to the operative theater.   Once adequate anesthesia, preoperative antibiotics, 2 gm of Ancef, 1 gm of Tranexamic Acid, and 10 mg of Decadron administered, the patient was positioned supine with the right thigh tourniquet placed.  The  right lower extremity was prepped and draped in sterile fashion.  A time-   out was performed identifying the patient, planned procedure, and   extremity.      The right lower extremity was placed in the [facility] leg holder.  The leg was   exsanguinated, tourniquet elevated to 250 mmHg.  A midline incision was   made followed by  median parapatellar arthrotomy.  Following initial   exposure, attention was first directed to the patella.  Precut   measurement was noted to be 22 mm.  I resected down to 13-14 mm and used a   35 anatomic patellar button to restore patellar height as well as cover the cut   surface.      The lug holes were drilled and a metal shim was placed to protect the   patella from retractors and saw blades.      At this point, attention was now directed to the femur.  The femoral   canal was opened with a drill, irrigated to try to prevent fat emboli.  An   intramedullary rod was passed at 3 degrees valgus, 9 mm of bone was   resected off the distal femur.  Following this resection, the tibia was   subluxated anteriorly.  Using the extramedullary guide, 2 mm of bone was resected off   the proximal medial tibia.  We confirmed the gap would be   stable medially and laterally with a size 6 mm insert as well as confirmed   the cut was perpendicular in the coronal plane, checking with an alignment rod.      Once this was done, I sized the femur to be a size 5 in the anterior-   posterior dimension, chose a narrow component based on medial and    lateral dimension.  The size 5 rotation block was then pinned in   position anterior referenced using the C-clamp to set rotation.  The   anterior, posterior, and  chamfer cuts were made without difficulty nor   notching making certain that I was along the anterior cortex to help   with flexion gap stability.      The final box cut was made off the lateral aspect of distal femur.      At this point, the tibia was sized to be a size 4, the size 4 tray was   then pinned in position through the medial third of the tubercle,   drilled, and keel punched.  Trial reduction was now carried with a 5 femur,  4 tibia, a size 7 then 8 mm insert, and the 34 anatomic patella botton.  The knee was brought to   extension, full extension with good flexion stability with the patella   tracking through the trochlea without application of pressure.  Given   all these findings the femoral lug holes were drilled then the trial components removed.  Final components were   opened and cement was mixed.  The knee was irrigated with normal saline   solution and pulse lavage.  The synovial lining was   then injected with 30 cc of 0.25% Marcaine with epinephrine and 1 cc of Toradol plus 30 cc of NS for a  total of 61 cc.      The knee was irrigated.  Final implants were then cemented onto clean and   dried cut surfaces of bone with the knee brought to extension with a size 8 mm trial insert.      Once the cement had fully cured, the excess cement was removed   throughout the knee.  I confirmed I was satisfied with the range of   motion and stability, and the final size 8 mm PS AOX insert was chosen.  It was   placed into the knee.      The tourniquet had been let down at 28 minutes.  No significant   hemostasis required.  The   extensor mechanism was then reapproximated using #1 Vicryl with the knee   in flexion.  The   remaining wound was closed with 2-0 Vicryl and running 4-0 Monocryl.   The knee was cleaned,  dried, dressed sterilely using Dermabond and   Aquacel dressing.  The patient was then   brought to recovery room in stable condition, tolerating the procedure   well.   Please note that Physician Assistant, Danae Orleans, PA-C, was present for the entirety of the case, and was utilized for pre-operative positioning, peri-operative retractor management, general facilitation of the procedure.  He was also utilized for primary wound closure at the end of the case.              Pietro Cassis Alvan Dame, M.D.    10/22/2015 3:26 PM

## 2015-10-22 NOTE — Discharge Instructions (Signed)

## 2015-10-22 NOTE — Transfer of Care (Signed)
Immediate Anesthesia Transfer of Care Note  Patient: Jill Bond  Procedure(s) Performed: Procedure(s): RIGHT TOTAL KNEE ARTHROPLASTY (Right)  Patient Location: PACU  Anesthesia Type:MAC, spinal  Level of Consciousness: Patient easily awoken, sedated, comfortable, cooperative, following commands, responds to stimulation.   Airway & Oxygen Therapy: Patient spontaneously breathing, ventilating well, oxygen via simple oxygen mask.  Post-op Assessment: Report given to PACU RN, vital signs reviewed and stable.   Post vital signs: Reviewed and stable.  Complications: No apparent anesthesia complications

## 2015-10-22 NOTE — Anesthesia Preprocedure Evaluation (Signed)
Anesthesia Evaluation  Patient identified by MRN, date of birth, ID band Patient awake    Reviewed: Allergy & Precautions, H&P , NPO status , Patient's Chart, lab work & pertinent test results, reviewed documented beta blocker date and time   History of Anesthesia Complications (+) PONV  Airway Mallampati: II  TM Distance: >3 FB Neck ROM: full    Dental no notable dental hx. (+) Dental Advisory Given, Teeth Intact   Pulmonary neg pulmonary ROS, shortness of breath and with exertion,    Pulmonary exam normal breath sounds clear to auscultation       Cardiovascular Exercise Tolerance: Good hypertension, Pt. on medications and Pt. on home beta blockers Normal cardiovascular exam Rhythm:regular Rate:Normal     Neuro/Psych Anxiety Depression negative neurological ROS  negative psych ROS   GI/Hepatic negative GI ROS, Neg liver ROS, GERD  Medicated and Controlled,  Endo/Other  Morbid obesity  Renal/GU negative Renal ROS  negative genitourinary   Musculoskeletal   Abdominal (+) + obese,   Peds  Hematology negative hematology ROS (+)   Anesthesia Other Findings   Reproductive/Obstetrics negative OB ROS                             Anesthesia Physical Anesthesia Plan  ASA: III  Anesthesia Plan: Spinal   Post-op Pain Management:    Induction:   Airway Management Planned:   Additional Equipment:   Intra-op Plan:   Post-operative Plan:   Informed Consent: I have reviewed the patients History and Physical, chart, labs and discussed the procedure including the risks, benefits and alternatives for the proposed anesthesia with the patient or authorized representative who has indicated his/her understanding and acceptance.   Dental Advisory Given  Plan Discussed with: CRNA  Anesthesia Plan Comments:         Anesthesia Quick Evaluation

## 2015-10-22 NOTE — Interval H&P Note (Signed)
History and Physical Interval Note:  10/22/2015 11:22 AM  Jill Bond  has presented today for surgery, with the diagnosis of RIGHT KNEE OA  The various methods of treatment have been discussed with the patient and family. After consideration of risks, benefits and other options for treatment, the patient has consented to  Procedure(s): RIGHT TOTAL KNEE ARTHROPLASTY (Right) as a surgical intervention .  The patient's history has been reviewed, patient examined, no change in status, stable for surgery.  I have reviewed the patient's chart and labs.  Questions were answered to the patient's satisfaction.     [name]

## 2015-10-23 DIAGNOSIS — E669 Obesity, unspecified: Secondary | ICD-10-CM | POA: Diagnosis present

## 2015-10-23 LAB — BASIC METABOLIC PANEL
Anion gap: 6 (ref 5–15)
BUN: 15 mg/dL (ref 6–20)
CHLORIDE: 107 mmol/L (ref 101–111)
CO2: 24 mmol/L (ref 22–32)
CREATININE: 0.81 mg/dL (ref 0.44–1.00)
Calcium: 8.9 mg/dL (ref 8.9–10.3)
GFR calc Af Amer: >60 mL/min (ref >60)
GFR calc non Af Amer: >60 mL/min (ref >60)
GLUCOSE: 157 mg/dL — AB (ref 65–99)
Potassium: 4.8 mmol/L (ref 3.5–5.1)
Sodium: 137 mmol/L (ref 135–145)

## 2015-10-23 LAB — CBC
HEMATOCRIT: 38.9 % (ref 36.0–46.0)
HEMOGLOBIN: 12.8 g/dL (ref 12.0–15.0)
MCH: 29.4 pg (ref 26.0–34.0)
MCHC: 32.9 g/dL (ref 30.0–36.0)
MCV: 89.2 fL (ref 78.0–100.0)
Platelets: 165 10*3/uL (ref 150–400)
RBC: 4.36 MIL/uL (ref 3.87–5.11)
RDW: 14 % (ref 11.5–15.5)
WBC: 13.2 10*3/uL — ABNORMAL HIGH (ref 4.0–10.5)

## 2015-10-23 MED ORDER — ASPIRIN 325 MG PO TBEC: 325.0000 mg | DELAYED_RELEASE_TABLET | Freq: Two times a day (BID) | ORAL | Status: AC

## 2015-10-23 MED ORDER — FERROUS SULFATE 325 (65 FE) MG PO TABS: 325.0000 mg | ORAL_TABLET | Freq: Three times a day (TID) | ORAL | Status: DC

## 2015-10-23 MED ORDER — POLYETHYLENE GLYCOL 3350 17 G PO PACK: 17.0000 g | PACK | Freq: Two times a day (BID) | ORAL | Status: DC

## 2015-10-23 MED ORDER — METHOCARBAMOL 500 MG PO TABS: 500.0000 mg | ORAL_TABLET | Freq: Four times a day (QID) | ORAL | Status: DC | PRN

## 2015-10-23 MED ORDER — CELECOXIB 200 MG PO CAPS: 200.0000 mg | ORAL_CAPSULE | Freq: Two times a day (BID) | ORAL | Status: DC

## 2015-10-23 MED ORDER — HYDROCODONE-ACETAMINOPHEN 7.5-325 MG PO TABS: 1.0000 | ORAL_TABLET | ORAL | Status: DC | PRN

## 2015-10-23 NOTE — Care Management Note (Signed)
Case Management Note  Patient Details  Name: Jill Bond MRN: [id number] Date of Birth: 02/12/1951  Subjective/Objective:                  RIGHT TOTAL KNEE ARTHROPLASTY (Right) Action/Plan: Discharge planning Expected Discharge Date:  10/23/15               Expected Discharge Plan:  [place]  In-House Referral:     Discharge planning Services  CM Consult  Post Acute Care Choice:    Choice offered to:  Patient  DME Arranged:  N/A DME Agency:  NA  HH Arranged:  PT HH Agency:  [facility]  Status of Service:  Completed, signed off  Medicare Important Message Given:    Date Medicare IM Given:    Medicare IM give by:    Date Additional Medicare IM Given:    Additional Medicare Important Message give by:     If discussed at [place] of Stay Meetings, dates discussed:    Additional Comments: CM met with pt in room to offer choice of home health agency.  Pt chooses [facility] to render HHPT.  Referral given to [facility] rep, Tim (on unit).  Pt has all DME needed at home and denies need for additional DME.  No other CM needs were communicated. Dellie Catholic, RN 10/23/2015, 1:20 PM

## 2015-10-23 NOTE — Evaluation (Signed)
Physical Therapy Evaluation Patient Details Name: TIMMY DELAHOUSSAYE MRN: [id number] DOB: 11/19/1950 Today's Date: 10/23/2015   History of Present Illness  65 yo female s/p R TKA 10/22/15  Clinical Impression  On eval, pt was Min guard-Min assist for mobility-walked ~60 feet with RW and practiced getting up/down 1 step. Pain ~5/10 with activity. Practiced/reviewed  exercises, gait training, and stair negotiation. All education completed. Okay to d/c from PT standpoint.     Follow Up Recommendations Home health PT;Supervision - Intermittent    Equipment Recommendations  None recommended by PT    Recommendations for Other Services       Precautions / Restrictions Precautions Precautions: Fall Restrictions Weight Bearing Restrictions: No RLE Weight Bearing: Weight bearing as tolerated      Mobility  Bed Mobility Overal bed mobility: Needs Assistance Bed Mobility: Supine to Sit     Supine to sit: Min guard;HOB elevated     General bed mobility comments: close guard for safety.   Transfers Overall transfer level: Needs assistance Equipment used: Rolling walker (2 wheeled) Transfers: Sit to/from Stand Sit to Stand: Min guard         General transfer comment: close guard for safety. VCs safety, hand placement  Ambulation/Gait Ambulation/Gait assistance: Min guard Ambulation Distance (Feet): 60 Feet Assistive device: Rolling walker (2 wheeled) Gait Pattern/deviations: Step-through pattern;Decreased stride length;Step-to pattern;Antalgic     General Gait Details: close guard for safety. Pt c/o L ankle pain (surgery 02/2015).   Stairs Stairs: Yes Stairs assistance: Min assist Stair Management: Step to pattern;Forwards;With walker Number of Stairs: 1 General stair comments: VCs safety, technqiue, sequence. Small amount of assist to stabilize.   Wheelchair Mobility    Modified Rankin (Stroke Patients Only)       Balance                                             Pertinent Vitals/Pain Pain Assessment: 0-10 Pain Score: 5  Pain Location: R knee, L ankle Pain Descriptors / Indicators: Sore Pain Intervention(s): Monitored during session;Ice applied;Repositioned    Home Living Family/patient expects to be discharged to:: Private residence Living Arrangements: Children;Non-relatives/Friends Available Help at Discharge: Family Type of Home: House Home Access: Stairs to enter   Technical brewer of Steps: 1+1 Home Layout: Able to live on main level with bedroom/bathroom Home Equipment: Walker - 2 wheels;Bedside commode;Shower seat      Prior Function Level of Independence: Independent               Hand Dominance        Extremity/Trunk Assessment   Upper Extremity Assessment: Defer to OT evaluation           Lower Extremity Assessment: Generalized weakness      Cervical / Trunk Assessment: Normal  Communication   Communication: No difficulties  Cognition Arousal/Alertness: Awake/alert Behavior During Therapy: WFL for tasks assessed/performed Overall Cognitive Status: Within Functional Limits for tasks assessed                      General Comments      Exercises Total Joint Exercises Ankle Circles/Pumps: AROM;Both;10 reps;Supine Quad Sets: AROM;Both;10 reps;Supine Hip ABduction/ADduction: AROM;Right;10 reps;Supine Straight Leg Raises: AROM;Right;10 reps;Supine Long Arc Quad: AROM;Right;10 reps;Seated Knee Flexion: AROM;Right;10 reps;Seated Goniometric ROM: ~5-70 degrees      Assessment/Plan    PT Assessment Patient needs  continued PT services  PT Diagnosis Difficulty walking;Acute pain   PT Problem List Decreased strength;Decreased range of motion;Decreased activity tolerance;Decreased balance;Decreased mobility;Obesity;Pain  PT Treatment Interventions DME instruction;Gait training;Stair training;Functional mobility training;Therapeutic activities;Patient/family  education;Balance training;Therapeutic exercise   PT Goals (Current goals can be found in the Care Plan section) Acute Rehab PT Goals Patient Stated Goal: home soon. to be able to get in the shower. PT Goal Formulation: With patient Time For Goal Achievement: 11/06/15 Potential to Achieve Goals: Good    Frequency 7X/week   Barriers to discharge        Co-evaluation               End of Session Equipment Utilized During Treatment: Gait belt Activity Tolerance: Patient tolerated treatment well Patient left: in chair;with call bell/phone within reach           Time: 0955-1023 PT Time Calculation (min) (ACUTE ONLY): 28 min   Charges:   PT Evaluation $PT Eval Low Complexity: 1 Procedure PT Treatments $Gait Training: 8-22 mins   PT G Codes:        Weston Anna, MPT Pager: [phone]

## 2015-10-23 NOTE — Progress Notes (Addendum)
     Subjective: 1 Day Post-Op Procedure(s) (LRB): RIGHT TOTAL KNEE ARTHROPLASTY (Right)   Seen by Dr. Alvan Dame. Patient reports pain as mild, pain controlled. Did not have a great night, didn't sleep well. Otherwise not reported events throughout the night. Ready to be discharged home, if she does well with PT and pain stays controlled.   Objective:   VITALS:   Filed Vitals:   10/23/15 0156 10/23/15 0539  BP: 142/93 132/64  Pulse: 61 55  Temp:    Resp: 16 16    Dorsiflexion/Plantar flexion intact Incision: dressing C/D/I No cellulitis present Compartment soft  LABS  Recent Labs  10/23/15 0442  HGB 12.8  HCT 38.9  WBC 13.2*  PLT 165     Recent Labs  10/23/15 0442  NA 137  K 4.8  BUN 15  CREATININE 0.81  GLUCOSE 157*     Assessment/Plan: 1 Day Post-Op Procedure(s) (LRB): RIGHT TOTAL KNEE ARTHROPLASTY (Right) Foley cath d/c'ed Advance diet Up with therapy D/C IV fluids Discharge home with home health Follow up in 2 weeks at [facility]. Follow up with OLIN,Topacio Cella D in 2 weeks.  Contact information:  [facility] [address], [place] [id number]       Obese (BMI 30-39.9) Estimated body mass index is 38.26 kg/(m^2) as calculated from the following:   Height as of this encounter: 5\' 4"  (1.626 m).   Weight as of this encounter: 101.152 kg (223 lb). Patient also counseled that weight may inhibit the healing process Patient counseled that losing weight will help with future health issues       West Pugh. Aadhav Uhlig   PAC  10/23/2015, 7:59 AM

## 2015-10-23 NOTE — Progress Notes (Signed)
Occupational Therapy Evaluation Patient Details Name: Jill Bond MRN: [id number] DOB: August 27, 1950 Today's Date: 10/23/2015    History of Present Illness 65 yo female s/p R TKA 10/22/15   Clinical Impression   All OT education completed and pt questions answered. No further OT needs; will sign off.    Follow Up Recommendations  No OT follow up;Supervision/Assistance - 24 hour    Equipment Recommendations  None recommended by OT    Recommendations for Other Services       Precautions / Restrictions Precautions Precautions: Fall Restrictions Weight Bearing Restrictions: No RLE Weight Bearing: Weight bearing as tolerated      Mobility Bed Mobility            General bed mobility comments: NT -- up in chair  Transfers Overall transfer level: Needs assistance Equipment used: Rolling walker (2 wheeled) Transfers: Sit to/from Stand Sit to Stand: Supervision;Min guard             Balance                                            ADL Overall ADL's : Needs assistance/impaired     Grooming: Wash/dry hands;Supervision/safety;Standing           Upper Body Dressing : Set up;Sitting   Lower Body Dressing: Minimal assistance;Sit to/from stand   Toilet Transfer: Supervision/safety;Min guard;Ambulation;BSC;RW   Toileting- Clothing Manipulation and Hygiene: Supervision/safety;Sit to/from stand       Functional mobility during ADLs: Supervision/safety;Min guard;Rolling walker General ADL Comments: Patient has walk-in shower with tub bench that she used after ankle surgery. Plans to do same technique this time. Daughter will assist as needed and pt states she does not need a review.      Vision     Perception     Praxis      Pertinent Vitals/Pain Pain Assessment: 0-10 Pain Score: 3  Pain Location: R knee Pain Descriptors / Indicators: Sore Pain Intervention(s): Limited activity within patient's tolerance;Monitored during  session;Repositioned     Hand Dominance     Extremity/Trunk Assessment Upper Extremity Assessment Upper Extremity Assessment: Overall WFL for tasks assessed   Lower Extremity Assessment Lower Extremity Assessment: Defer to PT evaluation   Cervical / Trunk Assessment Cervical / Trunk Assessment: Normal   Communication Communication Communication: No difficulties   Cognition Arousal/Alertness: Awake/alert Behavior During Therapy: WFL for tasks assessed/performed Overall Cognitive Status: Within Functional Limits for tasks assessed                     General Comments       Exercises       Shoulder Instructions      Home Living Family/patient expects to be discharged to:: Private residence Living Arrangements: Children;Non-relatives/Friends Available Help at Discharge: Family Type of Home: House Home Access: Stairs to enter Technical brewer of Steps: 1+1   Home Layout: Able to live on main level with bedroom/bathroom     Bathroom Shower/Tub: Occupational psychologist: Standard Bathroom Accessibility: Yes How Accessible: Accessible via walker Home Equipment: [facility] - 2 wheels;Bedside commode;Tub bench          Prior Functioning/Environment Level of Independence: Independent             OT Diagnosis: Acute pain   OT Problem List: Decreased strength;Decreased range of motion;Decreased activity tolerance;Decreased knowledge of use  of DME or AE;Pain   OT Treatment/Interventions:      OT Goals(Current goals can be found in the care plan section) Acute Rehab OT Goals Patient Stated Goal: home soon. to be able to get in the shower. OT Goal Formulation: All assessment and education complete, DC therapy  OT Frequency:     Barriers to D/C:            Co-evaluation              End of Session Equipment Utilized During Treatment: Rolling walker Nurse Communication: Mobility status  Activity Tolerance: Patient tolerated  treatment well Patient left: in chair;with call bell/phone within reach;with family/visitor present   Time: [id number] OT Time Calculation (min): 19 min Charges:  OT General Charges $OT Visit: 1 Procedure OT Evaluation $OT Eval Low Complexity: 1 Procedure G-Codes:    Othel Hoogendoorn A 11/10/2015, 12:03 PM

## 2015-10-25 DIAGNOSIS — F419 Anxiety disorder, unspecified: Secondary | ICD-10-CM | POA: Diagnosis not present

## 2015-10-25 DIAGNOSIS — M1990 Unspecified osteoarthritis, unspecified site: Secondary | ICD-10-CM | POA: Diagnosis not present

## 2015-10-25 DIAGNOSIS — Z471 Aftercare following joint replacement surgery: Secondary | ICD-10-CM | POA: Diagnosis not present

## 2015-10-25 DIAGNOSIS — Z96653 Presence of artificial knee joint, bilateral: Secondary | ICD-10-CM | POA: Diagnosis not present

## 2015-10-25 DIAGNOSIS — E669 Obesity, unspecified: Secondary | ICD-10-CM | POA: Diagnosis not present

## 2015-10-25 DIAGNOSIS — I10 Essential (primary) hypertension: Secondary | ICD-10-CM | POA: Diagnosis not present

## 2015-10-28 DIAGNOSIS — I10 Essential (primary) hypertension: Secondary | ICD-10-CM | POA: Diagnosis not present

## 2015-10-28 DIAGNOSIS — Z96653 Presence of artificial knee joint, bilateral: Secondary | ICD-10-CM | POA: Diagnosis not present

## 2015-10-28 DIAGNOSIS — F419 Anxiety disorder, unspecified: Secondary | ICD-10-CM | POA: Diagnosis not present

## 2015-10-28 DIAGNOSIS — M1990 Unspecified osteoarthritis, unspecified site: Secondary | ICD-10-CM | POA: Diagnosis not present

## 2015-10-28 DIAGNOSIS — Z471 Aftercare following joint replacement surgery: Secondary | ICD-10-CM | POA: Diagnosis not present

## 2015-10-28 DIAGNOSIS — E669 Obesity, unspecified: Secondary | ICD-10-CM | POA: Diagnosis not present

## 2015-10-28 NOTE — Discharge Summary (Signed)
Physician Discharge Summary  Patient ID: Jill Bond MRN: [id number] DOB/AGE: 64-Oct-1952 64 y.o.  Admit date: 10/22/2015 Discharge date: 10/23/2015   Procedures:  Procedure(s) (LRB): RIGHT TOTAL KNEE ARTHROPLASTY (Right)  Attending Physician:  Dr. Paralee Cancel   Admission Diagnoses:   Right knee primary OA /pain  Discharge Diagnoses:  Principal Problem:   S/P right TKA Active Problems:   Obese  Past Medical History  Diagnosis Date  . Hypertension   . Shortness of breath dyspnea     with exertion  . Anxiety   . Depression   . Chronic kidney disease 2006    Left Nephrectomy  . GERD (gastroesophageal reflux disease)   . Headache   . Arthritis   . Cancer ([place])     kidney LEFT  . Rotator cuff disorder     LEFT  . Complication of anesthesia     PONV  . PONV (postoperative nausea and vomiting)     HPI:    Jill Bond, 65 y.o. female, has a history of pain and functional disability in the right knee due to arthritis and has failed non-surgical conservative treatments for greater than 12 weeks to include NSAID's and/or analgesics, corticosteriod injections, viscosupplementation injections, use of assistive devices and activity modification. Onset of symptoms was gradual, starting 3-4 years ago with gradually worsening course since that time. The patient noted prior procedures on the knee to include arthroplasty on the left knee in [place], [place] in 2007. Patient currently rates pain in the right knee(s) at 10 out of 10 with activity. Patient has night pain, worsening of pain with activity and weight bearing, pain that interferes with activities of daily living, pain with passive range of motion, crepitus and joint swelling. Patient has evidence of periarticular osteophytes and joint space narrowing by imaging studies. There is no active infection. Risks, benefits and expectations were discussed with the patient. Risks including but not limited to the risk of  anesthesia, blood clots, nerve damage, blood vessel damage, failure of the prosthesis, infection and up to and including death. Patient understand the risks, benefits and expectations and wishes to proceed with surgery.   PCP: Madelyn Brunner, MD   Discharged Condition: good  Hospital Course:  Patient underwent the above stated procedure on 10/22/2015. Patient tolerated the procedure well and brought to the recovery room in good condition and subsequently to the floor.  POD #1 BP: 132/64 ; Pulse: 55 ; Resp: 16 Patient reports pain as mild, pain controlled. Did not have a great night, didn't sleep well. Otherwise not reported events throughout the night. Ready to be discharged home. Dorsiflexion/plantar flexion intact, incision: dressing C/D/I, no cellulitis present and compartment soft.   LABS  Basename    HGB     12.8  HCT     38.9    Discharge Exam: General appearance: alert, cooperative and no distress Extremities: Homans sign is negative, no sign of DVT, no edema, redness or tenderness in the calves or thighs and no ulcers, gangrene or trophic changes  Disposition: Home with follow up in 2 weeks   Follow-up Information    Follow up with [name], MD. Schedule an appointment as soon as possible for a visit in 2 weeks.   Specialty:  Orthopedic Surgery   Contact information:   [address] [place] [address] [phone]       Follow up with [facility].   Why:  home health physical therapy   Contact  information:   [place] [address] [phone]       Discharge Instructions    Call MD / Call 911    Complete by:  As directed   If you experience chest pain or shortness of breath, CALL 911 and be transported to the hospital emergency room.  If you develope a fever above 101 F, pus (white drainage) or increased drainage or redness at the wound, or calf pain, call your surgeon's office.     Change dressing     Complete by:  As directed   Maintain surgical dressing until follow up in the clinic. If the edges start to pull up, may reinforce with tape. If the dressing is no longer working, may remove and cover with gauze and tape, but must keep the area dry and clean.  Call with any questions or concerns.     Constipation Prevention    Complete by:  As directed   Drink plenty of fluids.  Prune juice may be helpful.  You may use a stool softener, such as Colace (over the counter) 100 mg twice a day.  Use MiraLax (over the counter) for constipation as needed.     Diet - low sodium heart healthy    Complete by:  As directed      Discharge instructions    Complete by:  As directed   Maintain surgical dressing until follow up in the clinic. If the edges start to pull up, may reinforce with tape. If the dressing is no longer working, may remove and cover with gauze and tape, but must keep the area dry and clean.  Follow up in 2 weeks at [facility]. Call with any questions or concerns.     Increase activity slowly as tolerated    Complete by:  As directed   Weight bearing as tolerated with assist device (walker, cane, etc) as directed, use it as long as suggested by your surgeon or therapist, typically at least 4-6 weeks.     TED hose    Complete by:  As directed   Use stockings (TED hose) for 2 weeks on both leg(s).  You may remove them at night for sleeping.             Medication List    STOP taking these medications        acetaminophen 500 MG tablet  Commonly known as:  TYLENOL     meloxicam 15 MG tablet  Commonly known as:  MOBIC      TAKE these medications        amLODipine 10 MG tablet  Commonly known as:  NORVASC  Take 10 mg by mouth at bedtime.     aspirin 325 MG EC tablet  Take 1 tablet (325 mg total) by mouth 2 (two) times daily.     celecoxib 200 MG capsule  Commonly known as:  CELEBREX  Take 1 capsule (200 mg total) by mouth every 12 (twelve) hours.      docusate sodium 100 MG capsule  Commonly known as:  COLACE  Take 200 mg by mouth at bedtime.     ferrous sulfate 325 (65 FE) MG tablet  Take 1 tablet (325 mg total) by mouth 3 (three) times daily after meals.     hydrochlorothiazide 25 MG tablet  Commonly known as:  HYDRODIURIL  Take 25 mg by mouth at bedtime.     HYDROcodone-acetaminophen 7.5-325 MG tablet  Commonly known as:  NORCO  Take 1-2 tablets by mouth every 4 (four) hours as needed for moderate pain.     LORazepam 0.5 MG tablet  Commonly known as:  ATIVAN  Take 0.5 mg by mouth every 8 (eight) hours as needed for anxiety.     Melatonin 5 MG Caps  Take 5 mg by mouth at bedtime as needed (For sleep.).     methocarbamol 500 MG tablet  Commonly known as:  ROBAXIN  Take 1 tablet (500 mg total) by mouth every 6 (six) hours as needed for muscle spasms.     metoprolol 200 MG 24 hr tablet  Commonly known as:  TOPROL-XL  Take 100 mg by mouth at bedtime.     multivitamin with minerals Tabs tablet  Take 1 tablet by mouth daily.     OMEGA-3 FATTY ACIDS PO  Take 1 capsule by mouth daily.     omeprazole 20 MG capsule  Commonly known as:  PRILOSEC  Take 20 mg by mouth at bedtime.     polyethylene glycol packet  Commonly known as:  MIRALAX / GLYCOLAX  Take 17 g by mouth 2 (two) times daily.     traZODone 50 MG tablet  Commonly known as:  DESYREL  Take 50 mg by mouth at bedtime as needed for sleep.         Signed: West Pugh. Dal Blew   PA-C  10/28/2015, 2:34 PM

## 2015-10-30 DIAGNOSIS — Z96653 Presence of artificial knee joint, bilateral: Secondary | ICD-10-CM | POA: Diagnosis not present

## 2015-10-30 DIAGNOSIS — F419 Anxiety disorder, unspecified: Secondary | ICD-10-CM | POA: Diagnosis not present

## 2015-10-30 DIAGNOSIS — I10 Essential (primary) hypertension: Secondary | ICD-10-CM | POA: Diagnosis not present

## 2015-10-30 DIAGNOSIS — E669 Obesity, unspecified: Secondary | ICD-10-CM | POA: Diagnosis not present

## 2015-10-30 DIAGNOSIS — M1990 Unspecified osteoarthritis, unspecified site: Secondary | ICD-10-CM | POA: Diagnosis not present

## 2015-10-30 DIAGNOSIS — Z471 Aftercare following joint replacement surgery: Secondary | ICD-10-CM | POA: Diagnosis not present

## 2015-11-01 DIAGNOSIS — I10 Essential (primary) hypertension: Secondary | ICD-10-CM | POA: Diagnosis not present

## 2015-11-01 DIAGNOSIS — Z96653 Presence of artificial knee joint, bilateral: Secondary | ICD-10-CM | POA: Diagnosis not present

## 2015-11-01 DIAGNOSIS — F419 Anxiety disorder, unspecified: Secondary | ICD-10-CM | POA: Diagnosis not present

## 2015-11-01 DIAGNOSIS — Z471 Aftercare following joint replacement surgery: Secondary | ICD-10-CM | POA: Diagnosis not present

## 2015-11-01 DIAGNOSIS — M1990 Unspecified osteoarthritis, unspecified site: Secondary | ICD-10-CM | POA: Diagnosis not present

## 2015-11-01 DIAGNOSIS — E669 Obesity, unspecified: Secondary | ICD-10-CM | POA: Diagnosis not present

## 2015-11-02 ENCOUNTER — Encounter: Payer: Self-pay | Admitting: *Deleted

## 2015-11-02 ENCOUNTER — Emergency Department: Payer: Commercial Managed Care - HMO

## 2015-11-02 ENCOUNTER — Emergency Department
Admission: EM | Admit: 2015-11-02 | Discharge: 2015-11-02 | Disposition: A | Discharge status: Home / Self Care | Payer: Commercial Managed Care - HMO | Attending: Student | Admitting: Student

## 2015-11-02 DIAGNOSIS — I129 Hypertensive chronic kidney disease with stage 1 through stage 4 chronic kidney disease, or unspecified chronic kidney disease: Secondary | ICD-10-CM | POA: Insufficient documentation

## 2015-11-02 DIAGNOSIS — Z85528 Personal history of other malignant neoplasm of kidney: Secondary | ICD-10-CM | POA: Insufficient documentation

## 2015-11-02 DIAGNOSIS — Z7982 Long term (current) use of aspirin: Secondary | ICD-10-CM | POA: Insufficient documentation

## 2015-11-02 DIAGNOSIS — N189 Chronic kidney disease, unspecified: Secondary | ICD-10-CM | POA: Diagnosis not present

## 2015-11-02 DIAGNOSIS — F329 Major depressive disorder, single episode, unspecified: Secondary | ICD-10-CM | POA: Diagnosis not present

## 2015-11-02 DIAGNOSIS — L089 Local infection of the skin and subcutaneous tissue, unspecified: Secondary | ICD-10-CM | POA: Diagnosis not present

## 2015-11-02 DIAGNOSIS — M7989 Other specified soft tissue disorders: Secondary | ICD-10-CM | POA: Diagnosis not present

## 2015-11-02 DIAGNOSIS — R609 Edema, unspecified: Secondary | ICD-10-CM

## 2015-11-02 DIAGNOSIS — M1990 Unspecified osteoarthritis, unspecified site: Secondary | ICD-10-CM | POA: Diagnosis not present

## 2015-11-02 DIAGNOSIS — Z79899 Other long term (current) drug therapy: Secondary | ICD-10-CM | POA: Diagnosis not present

## 2015-11-02 DIAGNOSIS — R52 Pain, unspecified: Secondary | ICD-10-CM

## 2015-11-02 DIAGNOSIS — T8453XA Infection and inflammatory reaction due to internal right knee prosthesis, initial encounter: Secondary | ICD-10-CM | POA: Diagnosis not present

## 2015-11-02 DIAGNOSIS — M25561 Pain in right knee: Secondary | ICD-10-CM | POA: Diagnosis not present

## 2015-11-02 DIAGNOSIS — R2241 Localized swelling, mass and lump, right lower limb: Secondary | ICD-10-CM | POA: Diagnosis present

## 2015-11-02 LAB — CBC
HCT: 37.9 % (ref 35.0–47.0)
HEMOGLOBIN: 13 g/dL (ref 12.0–16.0)
MCH: 29.9 pg (ref 26.0–34.0)
MCHC: 34.3 g/dL (ref 32.0–36.0)
MCV: 87.3 fL (ref 80.0–100.0)
Platelets: 199 10*3/uL (ref 150–440)
RBC: 4.34 MIL/uL (ref 3.80–5.20)
RDW: 15.2 % — ABNORMAL HIGH (ref 11.5–14.5)
WBC: 7 10*3/uL (ref 3.6–11.0)

## 2015-11-02 LAB — BASIC METABOLIC PANEL
Anion gap: 9 (ref 5–15)
BUN: 20 mg/dL (ref 6–20)
CHLORIDE: 99 mmol/L — AB (ref 101–111)
CO2: 27 mmol/L (ref 22–32)
CREATININE: 1.11 mg/dL — AB (ref 0.44–1.00)
Calcium: 9.5 mg/dL (ref 8.9–10.3)
GFR calc non Af Amer: 51 mL/min — ABNORMAL LOW (ref >60)
GFR, EST AFRICAN AMERICAN: 59 mL/min — AB (ref >60)
Glucose, Bld: 111 mg/dL — ABNORMAL HIGH (ref 65–99)
Potassium: 3.7 mmol/L (ref 3.5–5.1)
SODIUM: 135 mmol/L (ref 135–145)

## 2015-11-02 LAB — SEDIMENTATION RATE: SED RATE: 33 mm/h — AB (ref 0–30)

## 2015-11-02 MED ORDER — ONDANSETRON 4 MG PO TBDP
4.0000 mg | ORAL_TABLET | Freq: Once | ORAL | Status: AC
  Administered 2015-11-02: 4 mg via ORAL
  Filled 2015-11-02: qty 1

## 2015-11-02 MED ORDER — MORPHINE SULFATE (PF) 4 MG/ML IV SOLN
4.0000 mg | Freq: Once | INTRAVENOUS | Status: AC
  Administered 2015-11-02: 4 mg via INTRAMUSCULAR
  Filled 2015-11-02: qty 1

## 2015-11-02 NOTE — ED Notes (Signed)
Patient states she had a total right knee replacement on 10/22/2015. Patient c/o increased swelling distal to the knee and increased pain to the swelling area.

## 2015-11-02 NOTE — ED Provider Notes (Signed)
[facility] Emergency Department Provider Note   ____________________________________________  Time seen: Approximately 6:15 PM  I have reviewed the triage vital signs and the nursing notes.   HISTORY  Chief Complaint Leg Pain    HPI Jill Bond is a 65 y.o. female with history of hypertension, chronic kidney disease, status post right knee replacement on 10/22/2015 performed by Dr. Paralee Cancel of [facility] orthopedic surgery who presents for evaluation of 2 days right knee pain and swelling, gradual onset, constant, no modifying factors. She reports that some areas near the incision has appears slightly red. No fevers or chills. No chest pain or difficulty breathing. No vomiting or diarrhea. She is been working with physical therapy and is able to range the knee to 108.   Past Medical History  Diagnosis Date  . Hypertension   . Shortness of breath dyspnea     with exertion  . Anxiety   . Depression   . Chronic kidney disease 2006    Left Nephrectomy  . GERD (gastroesophageal reflux disease)   . Headache   . Arthritis   . Cancer ([place])     kidney LEFT  . Rotator cuff disorder     LEFT  . Complication of anesthesia     PONV  . PONV (postoperative nausea and vomiting)     Patient Active Problem List   Diagnosis Date Noted  . Obese 10/23/2015  . S/P right TKA 10/22/2015    Past Surgical History  Procedure Laterality Date  . Breast biopsy Left 2003    neg  . Shoulder arthroscopy w/ rotator cuff repair Left YRS AGO  . Ostectomy Left 02/22/2015    Procedure: OSTECTOMY;  Surgeon: Albertine Patricia, DPM;  Location: [facility] ORS;  Service: Podiatry;  Laterality: Left;  . Achilles tendon surgery Left 02/22/2015    Procedure: Secondary ACHILLES TENDON REPAIR;  Surgeon: Albertine Patricia, DPM;  Location: [facility] ORS;  Service: Podiatry;  Laterality: Left;  . Tonsillectomy  AGE 80  . Abdominal hysterectomy      PARTIAL  . Joint replacement Left 2007     Total Knee Replacement  . Total knee arthroplasty Right 10/22/2015    Procedure: RIGHT TOTAL KNEE ARTHROPLASTY;  Surgeon: Paralee Cancel, MD;  Location: [facility] ORS;  Service: Orthopedics;  Laterality: Right;  . Total knee arthroplasty Right     10/22/2015    Current Outpatient Rx  Name  Route  Sig  Dispense  Refill  . amLODipine (NORVASC) 10 MG tablet   Oral   Take 10 mg by mouth at bedtime.         Marland Kitchen aspirin EC 325 MG EC tablet   Oral   Take 1 tablet (325 mg total) by mouth 2 (two) times daily.   60 tablet   0     Take for 4 weeks.   . celecoxib (CELEBREX) 200 MG capsule   Oral   Take 1 capsule (200 mg total) by mouth every 12 (twelve) hours.   60 capsule   0   . docusate sodium (COLACE) 100 MG capsule   Oral   Take 200 mg by mouth at bedtime.         . ferrous sulfate 325 (65 FE) MG tablet   Oral   Take 1 tablet (325 mg total) by mouth 3 (three) times daily after meals.      3   . hydrochlorothiazide (HYDRODIURIL) 25 MG tablet   Oral   Take 25 mg by  mouth at bedtime.         Marland Kitchen HYDROcodone-acetaminophen (NORCO) 7.5-325 MG tablet   Oral   Take 1-2 tablets by mouth every 4 (four) hours as needed for moderate pain.   100 tablet   0   . LORazepam (ATIVAN) 0.5 MG tablet   Oral   Take 0.5 mg by mouth every 8 (eight) hours as needed for anxiety.       3   . Melatonin 5 MG CAPS   Oral   Take 5 mg by mouth at bedtime as needed (For sleep.).          Marland Kitchen methocarbamol (ROBAXIN) 500 MG tablet   Oral   Take 1 tablet (500 mg total) by mouth every 6 (six) hours as needed for muscle spasms.   40 tablet   0   . metoprolol (TOPROL-XL) 200 MG 24 hr tablet   Oral   Take 100 mg by mouth at bedtime.          . Multiple Vitamin (MULTIVITAMIN WITH MINERALS) TABS tablet   Oral   Take 1 tablet by mouth daily.         . OMEGA-3 FATTY ACIDS PO   Oral   Take 1 capsule by mouth daily.          Marland Kitchen omeprazole (PRILOSEC) 20 MG capsule   Oral   Take 20 mg by mouth  at bedtime.         . polyethylene glycol (MIRALAX / GLYCOLAX) packet   Oral   Take 17 g by mouth 2 (two) times daily.   14 each   0   . traZODone (DESYREL) 50 MG tablet   Oral   Take 50 mg by mouth at bedtime as needed for sleep.            Allergies Codeine and Penicillins  Family History  Problem Relation Age of Onset  . Breast cancer Mother 68  . Heart failure Father   . Prostate cancer Father     Social History Social History  Substance Use Topics  . Smoking status: Never Smoker   . Smokeless tobacco: Never Used  . Alcohol Use: No    Review of Systems Constitutional: No fever/chills Eyes: No visual changes. ENT: No sore throat. Cardiovascular: Denies chest pain. Respiratory: Denies shortness of breath. Gastrointestinal: No abdominal pain.  No nausea, no vomiting.  No diarrhea.  No constipation. Genitourinary: Negative for dysuria. Musculoskeletal: Negative for back pain. Skin: Negative for rash. Neurological: Negative for headaches, focal weakness or numbness.  10-point ROS otherwise negative.  ____________________________________________   PHYSICAL EXAM:  VITAL SIGNS: ED Triage Vitals  Enc Vitals Group     BP 11/02/15 1629 127/84 mmHg     Pulse Rate 11/02/15 1629 81     Resp 11/02/15 1629 16     Temp 11/02/15 1629 97.7 F (36.5 C)     Temp Source 11/02/15 1629 Oral     SpO2 11/02/15 1629 96 %     Weight 11/02/15 1629 220 lb (99.791 kg)     Height 11/02/15 1629 _0  (1.626 m)     Head Cir --      Peak Flow --      Pain Score 11/02/15 1639 6     Pain Loc --      Pain Edu? --      Excl. in [place]? --     Constitutional: Alert and oriented. Well appearing and in no acute distress.  Eyes: Conjunctivae are normal. PERRL. EOMI. Head: Atraumatic. Nose: No congestion/rhinnorhea. Mouth/Throat: Mucous membranes are moist.  Oropharynx non-erythematous. Neck: No stridor.   Cardiovascular: Normal rate, regular rhythm. Grossly normal heart sounds.   Good peripheral circulation. Respiratory: Normal respiratory effort.  No retractions. Lungs CTAB. Gastrointestinal: Soft and nontender. No distention. No CVA tenderness. Genitourinary: deferred Musculoskeletal: Healing large area of ecchymosis in the right posterior thigh, the midline incision on the anterior aspect of the right knee is closed with Dermabond and there is a small area adjacent to the inferior portion of the incision that is erythematous, no abnormal drainage, no fluctuance. There is edema of the right knee, more notably there is a circumscribed circular area of swelling medially and inferior to the right knee. The patient has excellent range of motion in the right knee though attempts at full flexion does cause pain. 2+ right DP pulse. Neurologic:  Normal speech and language. No gross focal neurologic deficits are appreciated.  Skin:  Skin is warm, dry and intact. No rash noted. Psychiatric: Mood and affect are normal. Speech and behavior are normal.  ____________________________________________   LABS (all labs ordered are listed, but only abnormal results are displayed)  Labs Reviewed  CBC - Abnormal; Notable for the following:    RDW 15.2 (*)    All other components within normal limits  BASIC METABOLIC PANEL - Abnormal; Notable for the following:    Chloride 99 (*)    Glucose, Bld 111 (*)    Creatinine, Ser 1.11 (*)    GFR calc non Af Amer 51 (*)    GFR calc Af Amer 59 (*)    All other components within normal limits  SEDIMENTATION RATE - Abnormal; Notable for the following:    Sed Rate 33 (*)    All other components within normal limits   ____________________________________________  EKG  none ____________________________________________  RADIOLOGY  Right knee xray IMPRESSION: RIGHT knee prosthesis without acute complication.  Regional soft tissue swelling.  Venous doppler US right leg IMPRESSION: No evidence of deep venous thrombosis right lower  extremity.  Soft tissue ultrasound right leg IMPRESSION: Crescentic complex fluid collection inferior to the knee in the anterior lower leg may represent complex seroma, liquefying hematoma, or less likely infection.  ____________________________________________   PROCEDURES  Procedure(s) performed: None  Critical Care performed: No  ____________________________________________   INITIAL IMPRESSION / ASSESSMENT AND PLAN / ED COURSE  Pertinent labs & imaging results that were available during my care of the patient were reviewed by me and considered in my medical decision making (see chart for details).  AERIS HERSMAN is a 65 y.o. female with history of hypertension, chronic kidney disease, status post right knee replacement on 10/22/2015 performed by Dr. Paralee Cancel of [facility] orthopedic surgery who presents for evaluation of 2 days right knee pain and swelling. On exam, she is very well-appearing and in no acute distress. Her vital signs are stable, she is afebrile. She is neurovascularly intact in the right leg. In general her incision appears to be healing well however there is a small area of erythema in the inferior portion which I suppose could represent a very mild/early soft tissue infection There is no incisional fluctuance or induration. There is also a smaller circumscribed area of swelling medial and inferior to the knee which clinically does not appear consistent with abscess however we will obtain an ultrasound of the area, we'll obtain an ultrasound to rule out DVT and treat her pain. She has  good range of motion, there is no other area of erythema or warmth associated with the knee joint and this does not appear consistent with septic arthritis. She has no fever, no leukocytosis. BMP shows mild creatinine elevation at 1.11. ESR was ordered in triage, very minimally elevated at 33. Plain films show right knee prosthesis without acute  complication.  ----------------------------------------- 8:21 PM on 11/02/2015 ----------------------------------------- Patient wit significant improvement of her pain at this time. Doppler ultrasound negative for DVT. Soft tissue ultrasound shows a crescentic complex fluid collection which could be a complex seroma or liquefying hematoma, less likely to be an infection. I discussed the case with Dr. supple of orthopedic surgery, he is on-call for the patient's orthopedic surgeon Dr. Alvan Dame. He recommended against antibiotics unless the patient has very convincing evidence of infection clinically, including elevation of her white blood cell count, or fever. She does not have any of those. He recommends discharge with supportive care including compression, elevation, she will skip her next physical therapy session as the most likely cause of her findings today is overuse related to recent PT sessions. I discussed with the patient. We discussed return precautions and she is comfortable to discharge plan. Patient will follow-up with her orthopedic doctor for her previously scheduled appointment on Tuesday.  ____________________________________________   FINAL CLINICAL IMPRESSION(S) / ED DIAGNOSES  Final diagnoses:  Pain  Swelling  Soft tissue infection      NEW MEDICATIONS STARTED DURING THIS VISIT:  New Prescriptions   No medications on file     Note:  This document was prepared using Dragon voice recognition software and may include unintentional dictation errors.    Joanne Gavel, MD 11/02/15 2028

## 2015-11-15 DIAGNOSIS — Z96651 Presence of right artificial knee joint: Secondary | ICD-10-CM | POA: Diagnosis not present

## 2015-11-19 DIAGNOSIS — I10 Essential (primary) hypertension: Secondary | ICD-10-CM | POA: Diagnosis not present

## 2015-11-19 DIAGNOSIS — Z96651 Presence of right artificial knee joint: Secondary | ICD-10-CM | POA: Diagnosis not present

## 2015-11-19 DIAGNOSIS — E7800 Pure hypercholesterolemia, unspecified: Secondary | ICD-10-CM | POA: Diagnosis not present

## 2015-11-26 DIAGNOSIS — Z96651 Presence of right artificial knee joint: Secondary | ICD-10-CM | POA: Diagnosis not present

## 2015-11-29 DIAGNOSIS — Z96651 Presence of right artificial knee joint: Secondary | ICD-10-CM | POA: Diagnosis not present

## 2015-12-04 DIAGNOSIS — Z471 Aftercare following joint replacement surgery: Secondary | ICD-10-CM | POA: Diagnosis not present

## 2015-12-04 DIAGNOSIS — Z96651 Presence of right artificial knee joint: Secondary | ICD-10-CM | POA: Diagnosis not present

## 2015-12-06 DIAGNOSIS — Z96651 Presence of right artificial knee joint: Secondary | ICD-10-CM | POA: Diagnosis not present

## 2015-12-10 DIAGNOSIS — Z96651 Presence of right artificial knee joint: Secondary | ICD-10-CM | POA: Diagnosis not present

## 2015-12-12 DIAGNOSIS — Z96651 Presence of right artificial knee joint: Secondary | ICD-10-CM | POA: Diagnosis not present

## 2015-12-16 DIAGNOSIS — Z96651 Presence of right artificial knee joint: Secondary | ICD-10-CM | POA: Diagnosis not present

## 2015-12-18 DIAGNOSIS — Z96651 Presence of right artificial knee joint: Secondary | ICD-10-CM | POA: Diagnosis not present

## 2015-12-27 DIAGNOSIS — Z96651 Presence of right artificial knee joint: Secondary | ICD-10-CM | POA: Diagnosis not present

## 2016-01-01 DIAGNOSIS — Z96651 Presence of right artificial knee joint: Secondary | ICD-10-CM | POA: Diagnosis not present

## 2016-01-03 DIAGNOSIS — Z96651 Presence of right artificial knee joint: Secondary | ICD-10-CM | POA: Diagnosis not present

## 2016-01-07 DIAGNOSIS — Z96651 Presence of right artificial knee joint: Secondary | ICD-10-CM | POA: Diagnosis not present

## 2016-01-08 DIAGNOSIS — M7662 Achilles tendinitis, left leg: Secondary | ICD-10-CM | POA: Diagnosis not present

## 2016-01-10 DIAGNOSIS — Z96651 Presence of right artificial knee joint: Secondary | ICD-10-CM | POA: Diagnosis not present

## 2016-01-20 DIAGNOSIS — Z96651 Presence of right artificial knee joint: Secondary | ICD-10-CM | POA: Diagnosis not present

## 2016-02-10 ENCOUNTER — Other Ambulatory Visit: Payer: Self-pay | Admitting: Internal Medicine

## 2016-02-10 DIAGNOSIS — Z1231 Encounter for screening mammogram for malignant neoplasm of breast: Secondary | ICD-10-CM

## 2016-02-11 DIAGNOSIS — I10 Essential (primary) hypertension: Secondary | ICD-10-CM | POA: Diagnosis not present

## 2016-02-11 DIAGNOSIS — R8290 Unspecified abnormal findings in urine: Secondary | ICD-10-CM | POA: Diagnosis not present

## 2016-02-11 DIAGNOSIS — E7800 Pure hypercholesterolemia, unspecified: Secondary | ICD-10-CM | POA: Diagnosis not present

## 2016-02-18 ENCOUNTER — Other Ambulatory Visit: Payer: Self-pay | Admitting: Internal Medicine

## 2016-02-18 DIAGNOSIS — K759 Inflammatory liver disease, unspecified: Secondary | ICD-10-CM | POA: Diagnosis not present

## 2016-02-18 DIAGNOSIS — C649 Malignant neoplasm of unspecified kidney, except renal pelvis: Secondary | ICD-10-CM | POA: Diagnosis not present

## 2016-02-18 DIAGNOSIS — Z23 Encounter for immunization: Secondary | ICD-10-CM | POA: Diagnosis not present

## 2016-02-18 DIAGNOSIS — I10 Essential (primary) hypertension: Secondary | ICD-10-CM | POA: Diagnosis not present

## 2016-02-18 DIAGNOSIS — E7800 Pure hypercholesterolemia, unspecified: Secondary | ICD-10-CM | POA: Diagnosis not present

## 2016-02-25 ENCOUNTER — Ambulatory Visit
Admission: RE | Admit: 2016-02-25 | Discharge: 2016-02-25 | Disposition: A | Discharge status: Home / Self Care | Payer: Commercial Managed Care - HMO | Source: Ambulatory Visit | Attending: Internal Medicine | Admitting: Internal Medicine

## 2016-02-25 DIAGNOSIS — K8020 Calculus of gallbladder without cholecystitis without obstruction: Secondary | ICD-10-CM | POA: Insufficient documentation

## 2016-02-25 DIAGNOSIS — R918 Other nonspecific abnormal finding of lung field: Secondary | ICD-10-CM | POA: Insufficient documentation

## 2016-02-25 DIAGNOSIS — Z85528 Personal history of other malignant neoplasm of kidney: Secondary | ICD-10-CM | POA: Insufficient documentation

## 2016-03-03 ENCOUNTER — Ambulatory Visit
Admission: RE | Admit: 2016-03-03 | Discharge: 2016-03-03 | Disposition: A | Discharge status: Home / Self Care | Payer: Commercial Managed Care - HMO | Source: Ambulatory Visit | Attending: Internal Medicine | Admitting: Internal Medicine

## 2016-03-03 DIAGNOSIS — Z1231 Encounter for screening mammogram for malignant neoplasm of breast: Secondary | ICD-10-CM | POA: Insufficient documentation

## 2016-03-04 ENCOUNTER — Ambulatory Visit: Payer: Commercial Managed Care - HMO

## 2016-03-09 ENCOUNTER — Inpatient Hospital Stay: Payer: Commercial Managed Care - HMO

## 2016-03-09 ENCOUNTER — Telehealth: Payer: Self-pay | Admitting: *Deleted

## 2016-03-09 ENCOUNTER — Inpatient Hospital Stay
Admission: AD | Admit: 2016-03-09 | Discharge: 2016-03-12 | DRG: 628 | Disposition: A | Discharge status: Home / Self Care | Payer: Commercial Managed Care - HMO | Source: Ambulatory Visit | Attending: Internal Medicine | Admitting: Internal Medicine

## 2016-03-09 ENCOUNTER — Encounter (INDEPENDENT_AMBULATORY_CARE_PROVIDER_SITE_OTHER): Payer: Self-pay

## 2016-03-09 ENCOUNTER — Encounter: Payer: Self-pay | Admitting: Hematology and Oncology

## 2016-03-09 ENCOUNTER — Inpatient Hospital Stay: Payer: Commercial Managed Care - HMO | Attending: Hematology and Oncology | Admitting: Hematology and Oncology

## 2016-03-09 VITALS — BP 133/78 | HR 67 | Temp 98.0°F | Resp 18 | Ht 64.0 in | Wt 205.5 lb

## 2016-03-09 DIAGNOSIS — R110 Nausea: Secondary | ICD-10-CM

## 2016-03-09 DIAGNOSIS — D1771 Benign lipomatous neoplasm of kidney: Secondary | ICD-10-CM

## 2016-03-09 DIAGNOSIS — N189 Chronic kidney disease, unspecified: Secondary | ICD-10-CM | POA: Diagnosis not present

## 2016-03-09 DIAGNOSIS — Z880 Allergy status to penicillin: Secondary | ICD-10-CM

## 2016-03-09 DIAGNOSIS — I129 Hypertensive chronic kidney disease with stage 1 through stage 4 chronic kidney disease, or unspecified chronic kidney disease: Secondary | ICD-10-CM | POA: Diagnosis not present

## 2016-03-09 DIAGNOSIS — Z90710 Acquired absence of both cervix and uterus: Secondary | ICD-10-CM | POA: Diagnosis not present

## 2016-03-09 DIAGNOSIS — R5383 Other fatigue: Secondary | ICD-10-CM | POA: Diagnosis not present

## 2016-03-09 DIAGNOSIS — Z85528 Personal history of other malignant neoplasm of kidney: Secondary | ICD-10-CM | POA: Diagnosis not present

## 2016-03-09 DIAGNOSIS — Z6835 Body mass index (BMI) 35.0-35.9, adult: Secondary | ICD-10-CM

## 2016-03-09 DIAGNOSIS — R591 Generalized enlarged lymph nodes: Secondary | ICD-10-CM | POA: Diagnosis not present

## 2016-03-09 DIAGNOSIS — Z791 Long term (current) use of non-steroidal anti-inflammatories (NSAID): Secondary | ICD-10-CM

## 2016-03-09 DIAGNOSIS — C799 Secondary malignant neoplasm of unspecified site: Secondary | ICD-10-CM | POA: Diagnosis not present

## 2016-03-09 DIAGNOSIS — R918 Other nonspecific abnormal finding of lung field: Secondary | ICD-10-CM | POA: Diagnosis not present

## 2016-03-09 DIAGNOSIS — Z515 Encounter for palliative care: Secondary | ICD-10-CM | POA: Diagnosis present

## 2016-03-09 DIAGNOSIS — Z79899 Other long term (current) drug therapy: Secondary | ICD-10-CM | POA: Insufficient documentation

## 2016-03-09 DIAGNOSIS — N179 Acute kidney failure, unspecified: Secondary | ICD-10-CM | POA: Diagnosis not present

## 2016-03-09 DIAGNOSIS — R61 Generalized hyperhidrosis: Secondary | ICD-10-CM | POA: Insufficient documentation

## 2016-03-09 DIAGNOSIS — K5900 Constipation, unspecified: Secondary | ICD-10-CM | POA: Insufficient documentation

## 2016-03-09 DIAGNOSIS — R590 Localized enlarged lymph nodes: Secondary | ICD-10-CM | POA: Diagnosis not present

## 2016-03-09 DIAGNOSIS — E43 Unspecified severe protein-calorie malnutrition: Secondary | ICD-10-CM | POA: Diagnosis not present

## 2016-03-09 DIAGNOSIS — Z7189 Other specified counseling: Secondary | ICD-10-CM | POA: Diagnosis not present

## 2016-03-09 DIAGNOSIS — R05 Cough: Secondary | ICD-10-CM | POA: Diagnosis not present

## 2016-03-09 DIAGNOSIS — R599 Enlarged lymph nodes, unspecified: Secondary | ICD-10-CM

## 2016-03-09 DIAGNOSIS — M4800 Spinal stenosis, site unspecified: Secondary | ICD-10-CM | POA: Insufficient documentation

## 2016-03-09 DIAGNOSIS — R0602 Shortness of breath: Secondary | ICD-10-CM | POA: Diagnosis not present

## 2016-03-09 DIAGNOSIS — E860 Dehydration: Secondary | ICD-10-CM | POA: Diagnosis present

## 2016-03-09 DIAGNOSIS — D649 Anemia, unspecified: Secondary | ICD-10-CM | POA: Diagnosis present

## 2016-03-09 DIAGNOSIS — M898X9 Other specified disorders of bone, unspecified site: Secondary | ICD-10-CM

## 2016-03-09 DIAGNOSIS — Z885 Allergy status to narcotic agent status: Secondary | ICD-10-CM

## 2016-03-09 DIAGNOSIS — K219 Gastro-esophageal reflux disease without esophagitis: Secondary | ICD-10-CM | POA: Diagnosis present

## 2016-03-09 DIAGNOSIS — Z96653 Presence of artificial knee joint, bilateral: Secondary | ICD-10-CM | POA: Diagnosis present

## 2016-03-09 DIAGNOSIS — Z905 Acquired absence of kidney: Secondary | ICD-10-CM

## 2016-03-09 DIAGNOSIS — R630 Anorexia: Secondary | ICD-10-CM | POA: Insufficient documentation

## 2016-03-09 DIAGNOSIS — Z9889 Other specified postprocedural states: Secondary | ICD-10-CM

## 2016-03-09 DIAGNOSIS — R634 Abnormal weight loss: Secondary | ICD-10-CM

## 2016-03-09 DIAGNOSIS — I10 Essential (primary) hypertension: Secondary | ICD-10-CM | POA: Diagnosis not present

## 2016-03-09 DIAGNOSIS — C649 Malignant neoplasm of unspecified kidney, except renal pelvis: Secondary | ICD-10-CM | POA: Diagnosis not present

## 2016-03-09 DIAGNOSIS — C800 Disseminated malignant neoplasm, unspecified: Secondary | ICD-10-CM | POA: Diagnosis present

## 2016-03-09 DIAGNOSIS — M19012 Primary osteoarthritis, left shoulder: Secondary | ICD-10-CM | POA: Insufficient documentation

## 2016-03-09 LAB — CBC WITH DIFFERENTIAL/PLATELET
Basophils Absolute: 0 10*3/uL (ref 0–0.1)
Basophils Relative: 1 %
Eosinophils Absolute: 0.3 10*3/uL (ref 0–0.7)
Eosinophils Relative: 7 %
HCT: 40 % (ref 35.0–47.0)
Hemoglobin: 13.5 g/dL (ref 12.0–16.0)
Lymphocytes Relative: 15 %
Lymphs Abs: 0.8 10*3/uL — ABNORMAL LOW (ref 1.0–3.6)
MCH: 29.6 pg (ref 26.0–34.0)
MCHC: 33.8 g/dL (ref 32.0–36.0)
MCV: 87.6 fL (ref 80.0–100.0)
Monocytes Absolute: 0.7 10*3/uL (ref 0.2–0.9)
Monocytes Relative: 14 %
Neutro Abs: 3.3 10*3/uL (ref 1.4–6.5)
Neutrophils Relative %: 63 %
Platelets: 166 10*3/uL (ref 150–440)
RBC: 4.56 MIL/uL (ref 3.80–5.20)
RDW: 15 % — ABNORMAL HIGH (ref 11.5–14.5)
WBC: 5.2 10*3/uL (ref 3.6–11.0)

## 2016-03-09 LAB — COMPREHENSIVE METABOLIC PANEL
ALT: 51 U/L (ref 14–54)
AST: 43 U/L — ABNORMAL HIGH (ref 15–41)
Albumin: 4.1 g/dL (ref 3.5–5.0)
Alkaline Phosphatase: 105 U/L (ref 38–126)
Anion gap: 11 (ref 5–15)
BUN: 31 mg/dL — ABNORMAL HIGH (ref 6–20)
CO2: 28 mmol/L (ref 22–32)
Calcium: 13.1 mg/dL (ref 8.9–10.3)
Chloride: 98 mmol/L — ABNORMAL LOW (ref 101–111)
Creatinine, Ser: 2.07 mg/dL — ABNORMAL HIGH (ref 0.44–1.00)
GFR calc Af Amer: 28 mL/min — ABNORMAL LOW (ref >60)
GFR calc non Af Amer: 24 mL/min — ABNORMAL LOW (ref >60)
Glucose, Bld: 114 mg/dL — ABNORMAL HIGH (ref 65–99)
Potassium: 3.9 mmol/L (ref 3.5–5.1)
Sodium: 137 mmol/L (ref 135–145)
Total Bilirubin: 0.9 mg/dL (ref 0.3–1.2)
Total Protein: 7.6 g/dL (ref 6.5–8.1)

## 2016-03-09 LAB — LACTATE DEHYDROGENASE: LDH: 136 U/L (ref 98–192)

## 2016-03-09 LAB — URIC ACID: Uric Acid, Serum: 9.1 mg/dL — ABNORMAL HIGH (ref 2.3–6.6)

## 2016-03-09 MED ORDER — TRAZODONE HCL 50 MG PO TABS: 50.0000 mg | ORAL_TABLET | Freq: Every evening | ORAL | Status: DC | PRN

## 2016-03-09 MED ORDER — METOPROLOL SUCCINATE ER 50 MG PO TB24
100.0000 mg | ORAL_TABLET | Freq: Every day | ORAL | Status: DC
  Administered 2016-03-09 – 2016-03-11 (×3): 100 mg via ORAL
  Filled 2016-03-09 (×3): qty 2

## 2016-03-09 MED ORDER — MELATONIN 5 MG PO TABS
5.0000 mg | ORAL_TABLET | Freq: Every evening | ORAL | Status: DC | PRN
  Filled 2016-03-09: qty 1

## 2016-03-09 MED ORDER — LORAZEPAM 0.5 MG PO TABS: 0.5000 mg | ORAL_TABLET | Freq: Three times a day (TID) | ORAL | Status: DC | PRN

## 2016-03-09 MED ORDER — METHOCARBAMOL 500 MG PO TABS: 500.0000 mg | ORAL_TABLET | Freq: Four times a day (QID) | ORAL | Status: DC | PRN

## 2016-03-09 MED ORDER — PANTOPRAZOLE SODIUM 40 MG PO TBEC
40.0000 mg | DELAYED_RELEASE_TABLET | Freq: Every day | ORAL | Status: DC
  Administered 2016-03-09 – 2016-03-12 (×4): 40 mg via ORAL
  Filled 2016-03-09 (×4): qty 1

## 2016-03-09 MED ORDER — DOCUSATE SODIUM 100 MG PO CAPS
200.0000 mg | ORAL_CAPSULE | Freq: Every day | ORAL | Status: DC
  Administered 2016-03-09 – 2016-03-11 (×3): 200 mg via ORAL
  Filled 2016-03-09 (×3): qty 2

## 2016-03-09 MED ORDER — SODIUM CHLORIDE 0.9 % IV SOLN
INTRAVENOUS | Status: DC
  Administered 2016-03-09 – 2016-03-12 (×6): via INTRAVENOUS

## 2016-03-09 MED ORDER — AMLODIPINE BESYLATE 10 MG PO TABS
10.0000 mg | ORAL_TABLET | Freq: Every day | ORAL | Status: DC
  Administered 2016-03-09 – 2016-03-11 (×3): 10 mg via ORAL
  Filled 2016-03-09 (×3): qty 1

## 2016-03-09 MED ORDER — ACETAMINOPHEN 650 MG RE SUPP: 650.0000 mg | Freq: Four times a day (QID) | RECTAL | Status: DC | PRN

## 2016-03-09 MED ORDER — ONDANSETRON HCL 4 MG/2ML IJ SOLN: 4.0000 mg | Freq: Four times a day (QID) | INTRAMUSCULAR | Status: DC | PRN

## 2016-03-09 MED ORDER — ACETAMINOPHEN 325 MG PO TABS
650.0000 mg | ORAL_TABLET | Freq: Four times a day (QID) | ORAL | Status: DC | PRN
  Administered 2016-03-11 – 2016-03-12 (×2): 650 mg via ORAL
  Filled 2016-03-09 (×2): qty 2

## 2016-03-09 MED ORDER — HEPARIN SODIUM (PORCINE) 5000 UNIT/ML IJ SOLN
5000.0000 [IU] | Freq: Three times a day (TID) | INTRAMUSCULAR | Status: DC
  Administered 2016-03-09 – 2016-03-12 (×4): 5000 [IU] via SUBCUTANEOUS
  Filled 2016-03-09 (×4): qty 1

## 2016-03-09 MED ORDER — ALBUTEROL SULFATE (2.5 MG/3ML) 0.083% IN NEBU: 2.5000 mg | INHALATION_SOLUTION | RESPIRATORY_TRACT | Status: DC | PRN

## 2016-03-09 MED ORDER — ONDANSETRON HCL 4 MG PO TABS: 4.0000 mg | ORAL_TABLET | Freq: Four times a day (QID) | ORAL | Status: DC | PRN

## 2016-03-09 NOTE — Telephone Encounter (Signed)
Dr. Mike Gip and spoke to daughter about pt's labs with calcium elevated and creat elevated. Due to pt previous hx of renal cell ca and ct scna showing probable mets to lungs and a lymph node with labs being elevated it was rec: that pt be directly admitted to the hosp.  The daughter is going to go to pt's house and get things packed and bring mother to hospital.  Told her to come to medical mall at the adm.registration desk and after she gets registered a staff member can help them get to room 126.  Called oncology floor and spoke to [facility] on duty with above report.

## 2016-03-09 NOTE — Progress Notes (Signed)
[facility] day:  03/09/2016  Chief Complaint: Jill Bond is a 65 y.o. female who is referred in consultation by Dr. Lisette Grinder for adenopathy.  HPI: The patient notes a history of renal cell carcinoma in 2007. States that she presented with fatigue but no hematuria.  Scans revealed a left-sided renal mass.  She underwent left nephrectomy on 03/15/2006.  Pathology is available. She believes she took a "chemotherapy pill".  Baseline creatinine is 0.9 - 1.1 (CrCl 50-63 ml/min).  The patient is followed by Dr. Gilford Rile. She presented on 02/18/2016 for her annual wellness exam.  She noted arthritic pain in her left shoulder. Labs from 02/11/2016 revealed a hematocrit of 40.3, hemoglobin 13.5, MCV 90.2, platelets 158,000, white count 4800 with an [place] of 2760.  Comprehensive metabolic panel revealed a creatinine of 1.3 (GFR of 41 ml/min). Calcium was 11.7 (8.7-10.3).  AST was 44 and ALT 50. Urinalysis revealed moderate bacteria and crystals, trace protein and negative nitrite and leukocyte esterase.  Hepatitis serologies, PTH, and SPEP were ordered.   Hepatitis A, B and C serologies were negative.  PTH was 13 (low).  SPEP revealed no monoclonal protein.  Abdominal and pelvic CT scan were ordered.  Abdominal and pelvic CT scan on 02/26/2016 revealed multiple new bibasilar lung nodules measuring up to 5 mm in size.  While benign (infectious/ inflammatory) etiologies were possible, metastatic disease was a concern given their appearance and the patient's history of renal cancer.  There was new para-aortic and gastrohepatic lymph nodes measuring up to 1 cm in size, also indeterminate though suspicious for metastatic disease.  Mammogram on 03/03/2016 revealed no evidence of malignancy.  She had a partial hysterectomy at age 11.  Colonoscopy in 2013 revealed polyps (next scheduled 2018).    Symptomatically, she patient notes fatigue.  She has had nausea spells  for the past 1-1/2 months. Appetite is poor.  She has lost 30 pounds.  She has had some drenching sweats at night for which she has had to change her clothes.  She feels "hot" in the abdominal area, but no documented fever.  She is had back pain for 3 weeks which she relates to spinal stenosis.  She denies any diarrhea. She takes stool softeners for constipation.  She notes significant reflux for which she now takes omeprazole twice a day.  She stopped going to her water aerobics class 2 weeks ago.   Past Medical History:  Diagnosis Date  . Anxiety   . Arthritis   . Cancer ([place])    kidney LEFT  . Chronic kidney disease 2006   Left Nephrectomy  . Complication of anesthesia    PONV  . Constipation   . Depression   . GERD (gastroesophageal reflux disease)   . Headache   . Hypertension   . PONV (postoperative nausea and vomiting)   . Rotator cuff disorder    LEFT  . Shortness of breath dyspnea    with exertion    Past Surgical History:  Procedure Laterality Date  . ABDOMINAL HYSTERECTOMY     PARTIAL  . ACHILLES TENDON SURGERY Left 02/22/2015   Procedure: Secondary ACHILLES TENDON REPAIR;  Surgeon: Albertine Patricia, DPM;  Location: [facility] ORS;  Service: Podiatry;  Laterality: Left;  . Bladder tac    . BREAST BIOPSY Left 2003   neg  . COLONOSCOPY  2013  . JOINT REPLACEMENT Left 2007   Total Knee Replacement  . OSTECTOMY Left 02/22/2015  Procedure: OSTECTOMY;  Surgeon: Albertine Patricia, DPM;  Location: [facility] ORS;  Service: Podiatry;  Laterality: Left;  . SHOULDER ARTHROSCOPY W/ ROTATOR CUFF REPAIR Left YRS AGO  . TONSILLECTOMY  AGE 11  . TOTAL KNEE ARTHROPLASTY Right 10/22/2015   Procedure: RIGHT TOTAL KNEE ARTHROPLASTY;  Surgeon: Paralee Cancel, MD;  Location: [facility];  Service: Orthopedics;  Laterality: Right;  . TOTAL KNEE ARTHROPLASTY Right    10/22/2015    Family History  Problem Relation Age of Onset  . Breast cancer Mother 54  . Heart failure Father   . Prostate cancer  Father     Social History:  reports that she has never smoked. She has never used smokeless tobacco. She reports that she does not drink alcohol or use drugs.  She denies any exposure to radiation or toxins.  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  Her husband died on 09-28-13.  Her daughter pat is her medical power of attorney.  The patient is accompanied by her daughter, [name], and her first cousin, Tonette Bihari, today.  Allergies:  Allergies  Allergen Reactions  . Codeine Nausea And Vomiting  . Penicillins Hives and Other (See Comments)    Has patient had a PCN reaction causing immediate rash, facial/tongue/throat swelling, SOB or lightheadedness with hypotension: no Has patient had a PCN reaction causing severe rash involving mucus membranes or skin necrosis: no Has patient had a PCN reaction that required hospitalization no Has patient had a PCN reaction occurring within the last 10 years: no If all of the above answers are "NO", then may proceed with Cephalosporin use.     Current Medications: Current Outpatient Prescriptions  Medication Sig Dispense Refill  . amLODipine (NORVASC) 10 MG tablet Take 10 mg by mouth at bedtime.    . docusate sodium (COLACE) 100 MG capsule Take 200 mg by mouth at bedtime.    . hydrochlorothiazide (HYDRODIURIL) 25 MG tablet Take 25 mg by mouth at bedtime.    Marland Kitchen LORazepam (ATIVAN) 0.5 MG tablet Take 0.5 mg by mouth every 8 (eight) hours as needed for anxiety.   3  . meloxicam (MOBIC) 15 MG tablet Take by mouth.    . methocarbamol (ROBAXIN) 500 MG tablet Take 1 tablet (500 mg total) by mouth every 6 (six) hours as needed for muscle spasms. 40 tablet 0  . metoprolol (TOPROL-XL) 200 MG 24 hr tablet Take 100 mg by mouth at bedtime.     . Multiple Vitamin (MULTIVITAMIN WITH MINERALS) TABS tablet Take 1 tablet by mouth daily.    . OMEGA-3 FATTY ACIDS PO Take 1 capsule by mouth daily.     Marland Kitchen omeprazole (PRILOSEC) 20 MG capsule Take 20 mg by mouth  at bedtime.    . traZODone (DESYREL) 50 MG tablet Take 50 mg by mouth at bedtime as needed for sleep.     . celecoxib (CELEBREX) 200 MG capsule Take 1 capsule (200 mg total) by mouth every 12 (twelve) hours. (Patient not taking: Reported on 03/09/2016) 60 capsule 0  . ferrous sulfate 325 (65 FE) MG tablet Take 1 tablet (325 mg total) by mouth 3 (three) times daily after meals. (Patient not taking: Reported on 03/09/2016)  3  . HYDROcodone-acetaminophen (NORCO) 7.5-325 MG tablet Take 1-2 tablets by mouth every 4 (four) hours as needed for moderate pain. (Patient not taking: Reported on 03/09/2016) 100 tablet 0  . Melatonin 5 MG CAPS Take 5 mg by mouth at bedtime as needed (For sleep.).     Marland Kitchen  polyethylene glycol (MIRALAX / GLYCOLAX) packet Take 17 g by mouth 2 (two) times daily. (Patient not taking: Reported on 03/09/2016) 14 each 0  . sulfamethoxazole-trimethoprim (BACTRIM DS,SEPTRA DS) 800-160 MG tablet      No current facility-administered medications for this visit.     Review of Systems:  GENERAL:  Fatigue.  Feels hot.  Some drenching night sweats.  Weight loss of 30 pounds.  PERFORMANCE STATUS (ECOG):  1 HEENT:  Partial loss of vision episodic x 1 1/2 years.  Runny nose and sore throat.  No mouth sores or tenderness. Lungs:  Shortness of breath on exertion.  Cough.  No hemoptysis. Cardiac:  No chest pain, palpitations, orthopnea, or PND. GI:  Nausea spells x 1 1/2 month.  Poor appetite.  Constipation on stool softeners.  Acid reflux requiring doubling of PPI.  No vomiting, diarrhea, melena or hematochezia. GU:  No urgency, frequency, dysuria, or hematuria. Musculoskeletal:  Back pain x 3 weeks.  Spinal stenosis.  Knee replaced 10/2015.  No muscle tenderness. Extremities:  No pain or swelling. Skin:  No rashes or skin changes. Neuro:  Headache x 2 weeks.  No numbness or weakness.  Balance or coordination off x 2 weeks. Endocrine:  No diabetes, thyroid issues, hot flashes or night  sweats. Psych:  No mood changes, depression or anxiety. Pain:  No focal pain. Review of systems:  All other systems reviewed and found to be negative.  Physical Exam: Blood pressure 133/78, pulse 67, temperature 98 F (36.7 C), temperature source Tympanic, resp. rate 18, height '5\' 4"'$  (1.626 m), weight 205 lb 7.5 oz (93.2 kg). GENERAL:  Well developed, well nourished, sitting comfortably in the exam room in no acute distress. MENTAL STATUS:  Alert and oriented to person, place and time. HEAD:  Brown curly hair.  Normocephalic, atraumatic, face symmetric, no Cushingoid features. EYES:  Glasses.  Blue eyes.  Pupils equal round and reactive to light and accomodation.  No conjunctivitis or scleral icterus. ENT:  Oropharynx clear without lesion.  Tongue normal. Mucous membranes moist.  RESPIRATORY:  Clear to auscultation without rales, wheezes or rhonchi. CARDIOVASCULAR:  Regular rate and rhythm without murmur, rub or gallop. ABDOMEN:  Soft, slightly tender in LLQ without guarding or rebound tenderness.  Active bowel sounds and no appreciable hepatosplenomegaly.  No masses. SKIN:  No rashes, ulcers or lesions. EXTREMITIES: No edema, no skin discoloration or tenderness.  No palpable cords. LYMPH NODES: No palpable cervical, supraclavicular, axillary or inguinal adenopathy  NEUROLOGICAL: Alert & oriented, cranial nerves II-XII intact; visual fields intact; motor strength 5/5 throughout; sensation intact; finger to nose and RAM normal; difficulty walking heel to toe; negative Rhomberg; bilateral patellar reflexes 2+; no clonus or Babinski. PSYCH:  Appropriate.   Appointment on 03/09/2016  Component Date Value Ref Range Status  . Sodium 03/09/2016 137  135 - 145 mmol/L Final  . Potassium 03/09/2016 3.9  3.5 - 5.1 mmol/L Final  . Chloride 03/09/2016 98* 101 - 111 mmol/L Final  . CO2 03/09/2016 28  22 - 32 mmol/L Final  . Glucose, Bld 03/09/2016 114* 65 - 99 mg/dL Final  . BUN 03/09/2016 31* 6 - 20  mg/dL Final  . Creatinine, Ser 03/09/2016 2.07* 0.44 - 1.00 mg/dL Final  . Calcium 03/09/2016 13.1* 8.9 - 10.3 mg/dL Final   Comment: RESULTS VERIFIED BY REPEAT TESTING CRITICAL RESULT CALLED TO, READ BACK BY AND VERIFIED WITH ANITA BLACK AT 1124 03/09/2016 KMR   . Total Protein 03/09/2016 7.6  6.5 - 8.1  g/dL Final  . Albumin 03/09/2016 4.1  3.5 - 5.0 g/dL Final  . AST 03/09/2016 43* 15 - 41 U/L Final  . ALT 03/09/2016 51  14 - 54 U/L Final  . Alkaline Phosphatase 03/09/2016 105  38 - 126 U/L Final  . Total Bilirubin 03/09/2016 0.9  0.3 - 1.2 mg/dL Final  . GFR calc non Af Amer 03/09/2016 24* >60 mL/min Final  . GFR calc Af Amer 03/09/2016 28* >60 mL/min Final   Comment: (NOTE) The eGFR has been calculated using the CKD EPI equation. This calculation has not been validated in all clinical situations. eGFR's persistently <60 mL/min signify possible Chronic Kidney Disease.   . Anion gap 03/09/2016 11  5 - 15 Final  . LDH 03/09/2016 136  98 - 192 U/L Final  . Uric Acid, Serum 03/09/2016 9.1* 2.3 - 6.6 mg/dL Final  . WBC 03/09/2016 5.2  3.6 - 11.0 K/uL Final  . RBC 03/09/2016 4.56  3.80 - 5.20 MIL/uL Final  . Hemoglobin 03/09/2016 13.5  12.0 - 16.0 g/dL Final  . HCT 03/09/2016 40.0  35.0 - 47.0 % Final  . MCV 03/09/2016 87.6  80.0 - 100.0 fL Final  . MCH 03/09/2016 29.6  26.0 - 34.0 pg Final  . MCHC 03/09/2016 33.8  32.0 - 36.0 g/dL Final  . RDW 03/09/2016 15.0* 11.5 - 14.5 % Final  . Platelets 03/09/2016 166  150 - 440 K/uL Final  . Neutrophils Relative % 03/09/2016 63  % Final  . Neutro Abs 03/09/2016 3.3  1.4 - 6.5 K/uL Final  . Lymphocytes Relative 03/09/2016 15  % Final  . Lymphs Abs 03/09/2016 0.8* 1.0 - 3.6 K/uL Final  . Monocytes Relative 03/09/2016 14  % Final  . Monocytes Absolute 03/09/2016 0.7  0.2 - 0.9 K/uL Final  . Eosinophils Relative 03/09/2016 7  % Final  . Eosinophils Absolute 03/09/2016 0.3  0 - 0.7 K/uL Final  . Basophils Relative 03/09/2016 1  % Final  .  Basophils Absolute 03/09/2016 0.0  0 - 0.1 K/uL Final    Assessment:  Jill Bond is a 65 y.o. female with small upper abdominal adenopathy and bibasilar lung nodes.  She notes a 30 pound weight loss and some drenching night sweats suggestive of lymphoma.  She has a history of renal cell carcinoma s/p left nephrectomy on 03/15/2006.  Pathology is available.  Baseline creatinine is 0.9 - 1.1 (CrCl 50-63 ml/min).  Abdominal and pelvic CT scan on 02/26/2016 revealed multiple new bibasilar lung nodules measuring up to 5 mm in size. There was new para-aortic and gastrohepatic lymph nodes measuring up to 1 cm in size, suspicious for metastatic disease.  Labs on 02/11/2016 revealed a creatinine of 1.3 (GFR of 41 ml/min), calcium was 11.7 (8.7-10.3), AST 44 and ALT 50.  Urinalysis revealed trace protein.  Hepatitis A,B, and C serologies were negative.  SPEP was negative.  PTH was low.  Mammogram on 03/03/2016 revealed no evidence of malignancy.  Colonoscopy in 2013 revealed polyps (next scheduled 2018).  She had a partial hysterectomy at age 16.   Symptomatically, she has a  2 month history of fatigue, nausea, drenching sweats, and constipation.  She has worsening reflux.  Symptoms are concerning for malignancy and progressive hypercalcemia.  Plan: 1.  Discuss imaging studies.  Differential diagnosis reviewed.  Discussed additional imaging (chest CT and bone scan). 2.  Labs today: CBC with diff, CMP, LDH, uric acid, PTH-related peptide.  3.  Schedule chest CT no  contrast:  assess pulmonary nodules. 4.  Schedule bone scan: assess back pain; hypercalcemia. 5.  Obtain records from nephrectomy and diagnosis of renal cell carcinoma from Dr. Rogers Blocker. 6.  RTC after above for MD assessment.  Addendum:  Labs today revealed a creatinine of 2.07 (CrCl 24 ml/min), calcium 13.1, and uric acid 9.1.  Dr Gilford Rile and the patient contacted.  Plan for admission secondary to acute renal insufficiency and hypercalcemia.   Her work-up will be expedited.   Lequita Asal, MD  03/09/2016, 3:08 PM

## 2016-03-09 NOTE — H&P (Addendum)
[facility] NAME: Jill Bond    MR#:  [id number]  DATE OF BIRTH:  1950-11-28  DATE OF ADMISSION:  03/09/2016  PRIMARY CARE PHYSICIAN: Madelyn Brunner, MD   REQUESTING/REFERRING PHYSICIAN: Dr. Susy Bond  CHIEF COMPLAINT:  No chief complaint on file.  Renal failure and hypercalcemia HISTORY OF PRESENT ILLNESS:  Jill Bond  is a 65 y.o. female with a known history of Renal carcinoma, status post the left nephrectomy, hypertension and CKD. The patient got a CAT scan of abdomen and the polys on 02/26/2016, which showed multiple new basilar lung nodules. Dr. Susy Bond has concerns about metastatic disease due to history of renal cancer. She planned to get CT with contrast, but found the patient has acute renal failure. In addition, her calcium is elevated at 13.1. Dr. Susy Bond requested direct admission to rehydrate patient. Patient said she has had poor oral intake, nausea and vomiting for a few days. She lost weight about 30 pounds for the past 6 weeks. She also complains of cough but denies any fever or chills.  PAST MEDICAL HISTORY:   Past Medical History:  Diagnosis Date  . Anxiety   . Arthritis   . Cancer ([place])    kidney LEFT  . Chronic kidney disease 2006   Left Nephrectomy  . Complication of anesthesia    PONV  . Constipation   . Depression   . GERD (gastroesophageal reflux disease)   . Headache   . Hypertension   . PONV (postoperative nausea and vomiting)   . Rotator cuff disorder    LEFT  . Shortness of breath dyspnea    with exertion    PAST SURGICAL HISTORY:   Past Surgical History:  Procedure Laterality Date  . ABDOMINAL HYSTERECTOMY     PARTIAL  . ACHILLES TENDON SURGERY Left 02/22/2015   Procedure: Secondary ACHILLES TENDON REPAIR;  Surgeon: Albertine Patricia, DPM;  Location: [facility] ORS;  Service: Podiatry;  Laterality: Left;  . Bladder tac    . BREAST BIOPSY Left 2003   neg  . COLONOSCOPY  2013    . JOINT REPLACEMENT Left 2007   Total Knee Replacement  . OSTECTOMY Left 02/22/2015   Procedure: OSTECTOMY;  Surgeon: Albertine Patricia, DPM;  Location: [facility] ORS;  Service: Podiatry;  Laterality: Left;  . SHOULDER ARTHROSCOPY W/ ROTATOR CUFF REPAIR Left YRS AGO  . TONSILLECTOMY  AGE 49  . TOTAL KNEE ARTHROPLASTY Right 10/22/2015   Procedure: RIGHT TOTAL KNEE ARTHROPLASTY;  Surgeon: Paralee Cancel, MD;  Location: [facility];  Service: Orthopedics;  Laterality: Right;  . TOTAL KNEE ARTHROPLASTY Right    10/22/2015    SOCIAL HISTORY:   Social History  Substance Use Topics  . Smoking status: Never Smoker  . Smokeless tobacco: Never Used  . Alcohol use No    FAMILY HISTORY:   Family History  Problem Relation Age of Onset  . Breast cancer Mother 80  . Heart failure Father   . Prostate cancer Father     DRUG ALLERGIES:   Allergies  Allergen Reactions  . Codeine Nausea And Vomiting  . Penicillins Hives and Other (See Comments)    Has patient had a PCN reaction causing immediate rash, facial/tongue/throat swelling, SOB or lightheadedness with hypotension: no Has patient had a PCN reaction causing severe rash involving mucus membranes or skin necrosis: no Has patient had a PCN reaction that required hospitalization no Has patient had a PCN reaction occurring within the last  10 years: no If all of the above answers are "NO", then may proceed with Cephalosporin use.     REVIEW OF SYSTEMS:   Review of Systems  Constitutional: Positive for malaise/fatigue. Negative for chills, fever and weight loss.  Eyes: Negative for blurred vision and double vision.  Respiratory: Positive for cough. Negative for hemoptysis, sputum production, shortness of breath and stridor.   Cardiovascular: Negative for chest pain and leg swelling.  Gastrointestinal: Positive for nausea and vomiting. Negative for abdominal pain, blood in stool, diarrhea and melena.  Genitourinary: Negative for dysuria and urgency.   Musculoskeletal: Negative for joint pain.  Skin: Negative for itching and rash.  Neurological: Negative for dizziness, focal weakness, loss of consciousness and headaches.  Psychiatric/Behavioral: Negative for depression. The patient is not nervous/anxious.     MEDICATIONS AT HOME:   Prior to Admission medications   Medication Sig Start Date End Date Taking? Authorizing Provider  amLODipine (NORVASC) 10 MG tablet Take 10 mg by mouth at bedtime.    Historical Provider, MD  celecoxib (CELEBREX) 200 MG capsule Take 1 capsule (200 mg total) by mouth every 12 (twelve) hours. Patient not taking: Reported on 03/09/2016 10/23/15   Danae Orleans, PA-C  docusate sodium (COLACE) 100 MG capsule Take 200 mg by mouth at bedtime.    Historical Provider, MD  ferrous sulfate 325 (65 FE) MG tablet Take 1 tablet (325 mg total) by mouth 3 (three) times daily after meals. Patient not taking: Reported on 03/09/2016 10/23/15   Danae Orleans, PA-C  hydrochlorothiazide (HYDRODIURIL) 25 MG tablet Take 25 mg by mouth at bedtime.    Historical Provider, MD  HYDROcodone-acetaminophen (NORCO) 7.5-325 MG tablet Take 1-2 tablets by mouth every 4 (four) hours as needed for moderate pain. Patient not taking: Reported on 03/09/2016 10/23/15   Danae Orleans, PA-C  LORazepam (ATIVAN) 0.5 MG tablet Take 0.5 mg by mouth every 8 (eight) hours as needed for anxiety.  07/03/15   Historical Provider, MD  Melatonin 5 MG CAPS Take 5 mg by mouth at bedtime as needed (For sleep.).     Historical Provider, MD  meloxicam (MOBIC) 15 MG tablet Take by mouth. 03/25/15   Historical Provider, MD  methocarbamol (ROBAXIN) 500 MG tablet Take 1 tablet (500 mg total) by mouth every 6 (six) hours as needed for muscle spasms. 10/23/15   Danae Orleans, PA-C  metoprolol (TOPROL-XL) 200 MG 24 hr tablet Take 100 mg by mouth at bedtime.     Historical Provider, MD  Multiple Vitamin (MULTIVITAMIN WITH MINERALS) TABS tablet Take 1 tablet by mouth daily.     Historical Provider, MD  OMEGA-3 FATTY ACIDS PO Take 1 capsule by mouth daily.     Historical Provider, MD  omeprazole (PRILOSEC) 20 MG capsule Take 20 mg by mouth at bedtime.    Historical Provider, MD  polyethylene glycol (MIRALAX / GLYCOLAX) packet Take 17 g by mouth 2 (two) times daily. Patient not taking: Reported on 03/09/2016 10/23/15   Danae Orleans, PA-C  sulfamethoxazole-trimethoprim (BACTRIM DS,SEPTRA DS) 800-160 MG tablet  02/15/16   Historical Provider, MD  traZODone (DESYREL) 50 MG tablet Take 50 mg by mouth at bedtime as needed for sleep.     Historical Provider, MD      VITAL SIGNS:  Blood pressure (!) 150/72, pulse 79, temperature 98.5 F (36.9 C), temperature source Oral, resp. rate 20, SpO2 100 %.  PHYSICAL EXAMINATION:  Physical Exam  GENERAL:  65 y.o.-year-old patient lying in the bed with no acute  distress. Obese. EYES: Pupils equal, round, reactive to light and accommodation. No scleral icterus. Extraocular muscles intact.  HEENT: Head atraumatic, normocephalic. Oropharynx and nasopharynx clear.  NECK:  Supple, no jugular venous distention. No thyroid enlargement, no tenderness.  LUNGS: Normal breath sounds bilaterally, no wheezing, rales,rhonchi or crepitation. No use of accessory muscles of respiration.  CARDIOVASCULAR: S1, S2 normal. No murmurs, rubs, or gallops.  ABDOMEN: Soft, nontender, nondistended. Bowel sounds present. No organomegaly or mass.  EXTREMITIES: No pedal edema, cyanosis, or clubbing.  NEUROLOGIC: Cranial nerves II through XII are intact. Muscle strength 5/5 in all extremities. Sensation intact. Gait not checked.  PSYCHIATRIC: The patient is alert and oriented x 3.  SKIN: No obvious rash, lesion, or ulcer.   LABORATORY PANEL:   CBC  Recent Labs Lab 03/09/16 1035  WBC 5.2  HGB 13.5  HCT 40.0  PLT 166   ------------------------------------------------------------------------------------------------------------------  Chemistries    Recent Labs Lab 03/09/16 1035  NA 137  K 3.9  CL 98*  CO2 28  GLUCOSE 114*  BUN 31*  CREATININE 2.07*  CALCIUM 13.1*  AST 43*  ALT 51  ALKPHOS 105  BILITOT 0.9   ------------------------------------------------------------------------------------------------------------------  Cardiac Enzymes No results for input(s): TROPONINI in the last 168 hours. ------------------------------------------------------------------------------------------------------------------  RADIOLOGY:  No results found.    IMPRESSION AND PLAN:   Hypercalcemia Start normal saline IV and follow-up causing level.  Acute renal failure, possible due to dehydration and ATN.  Hold the Mobic and HCTZ. Start normal saline IV and follow-up BMP.  History of left renal carcinoma, status post surgery. Follow-up Dr. Mike Gip for CT chest with contrast after renal function improves.  Hypertension. Continue hypertension medication except HCTZ.  All the records are reviewed and case discussed with ED provider. Management plans discussed with the patient, Her daughter and they are in agreement.  CODE STATUS: Full code  TOTAL TIME TAKING CARE OF THIS PATIENT: 55 minutes.    Demetrios Loll M.D on 03/09/2016 at 7:12 PM  Between 7am to 6pm - Pager - [phone]  After 6pm go to [email or web address] - Proofreader  [facility]  Office  [phone]  CC: Primary care physician; Madelyn Brunner, MD   Note: This dictation was prepared with Dragon dictation along with smaller phrase technology. Any transcriptional errors that result from this process are unintentional.

## 2016-03-09 NOTE — Progress Notes (Signed)
Patient here today as new evaluation regarding adenopathy.  Referred by Dr. Gilford Rile.  Patient complains of persistent nausea.  States she has decreased appetite.

## 2016-03-09 NOTE — Progress Notes (Signed)
   03/09/16 2000  Clinical Encounter Type  Visited With Patient;Family;Patient and family together  Visit Type Initial;Psychological support;Spiritual support;Social support;Other (Comment)  Referral From Nurse;Care management  Consult/Referral To Chaplain  Recommendations Information and possible completion of Advance Directive  Spiritual Encounters  Spiritual Needs Prayer;Emotional;Grief support;Other (Comment)  Stress Factors  Patient Stress Factors Exhausted;Family relationships;Health changes;Lack of caregivers;Loss;Loss of control;Major life changes  Family Stress Factors Exhausted;Family relationships;Financial concerns;Health changes;Lack of caregivers;Loss;Loss of control;Major life changes  Advance Directives (For Healthcare)  Does patient have an advance directive? No  Would patient like information on creating an advanced directive? Yes - Scientist, clinical (histocompatibility and immunogenetics) given;Yes - Spiritual care consult ordered    Patient was definite in her position to move forward with the Advance Directive. Patient stated that it has been "thirty months since her husband died and she misses him more now", "people say you get over it, I don't believe that". Patient does not seemed depressed, just comfortable with whatever happens with her health. She said she is the main assistance for her daughter Jill Bond and her children (2) and has a total of  6 grandchildren, 1 great grand child. I think the patient is tired. She has one kidney after having one removed. Also she had a knee replacement not long ago and seemed a bit flustered after sharing that the Physician that was with her earlier seemed to yell at her because of response she made. While tired, her daughter and her understand the information. It looks as if they will move forward to complete the necessary documentation. I will check for possible notaries and see can we find witnesses at this later hour.

## 2016-03-10 ENCOUNTER — Encounter: Payer: Self-pay | Admitting: Hematology and Oncology

## 2016-03-10 ENCOUNTER — Inpatient Hospital Stay: Payer: Commercial Managed Care - HMO

## 2016-03-10 ENCOUNTER — Encounter: Payer: Self-pay | Admitting: Radiology

## 2016-03-10 DIAGNOSIS — M549 Dorsalgia, unspecified: Secondary | ICD-10-CM

## 2016-03-10 DIAGNOSIS — R5383 Other fatigue: Secondary | ICD-10-CM

## 2016-03-10 DIAGNOSIS — R110 Nausea: Secondary | ICD-10-CM

## 2016-03-10 DIAGNOSIS — K219 Gastro-esophageal reflux disease without esophagitis: Secondary | ICD-10-CM

## 2016-03-10 DIAGNOSIS — N189 Chronic kidney disease, unspecified: Secondary | ICD-10-CM

## 2016-03-10 DIAGNOSIS — R634 Abnormal weight loss: Secondary | ICD-10-CM

## 2016-03-10 DIAGNOSIS — K5900 Constipation, unspecified: Secondary | ICD-10-CM

## 2016-03-10 DIAGNOSIS — I10 Essential (primary) hypertension: Secondary | ICD-10-CM

## 2016-03-10 DIAGNOSIS — R918 Other nonspecific abnormal finding of lung field: Secondary | ICD-10-CM

## 2016-03-10 DIAGNOSIS — Z8042 Family history of malignant neoplasm of prostate: Secondary | ICD-10-CM

## 2016-03-10 DIAGNOSIS — R61 Generalized hyperhidrosis: Secondary | ICD-10-CM

## 2016-03-10 DIAGNOSIS — N179 Acute kidney failure, unspecified: Secondary | ICD-10-CM

## 2016-03-10 DIAGNOSIS — R630 Anorexia: Secondary | ICD-10-CM

## 2016-03-10 DIAGNOSIS — Z90710 Acquired absence of both cervix and uterus: Secondary | ICD-10-CM

## 2016-03-10 DIAGNOSIS — R0609 Other forms of dyspnea: Secondary | ICD-10-CM

## 2016-03-10 DIAGNOSIS — Z905 Acquired absence of kidney: Secondary | ICD-10-CM

## 2016-03-10 DIAGNOSIS — I129 Hypertensive chronic kidney disease with stage 1 through stage 4 chronic kidney disease, or unspecified chronic kidney disease: Secondary | ICD-10-CM

## 2016-03-10 DIAGNOSIS — D649 Anemia, unspecified: Secondary | ICD-10-CM

## 2016-03-10 DIAGNOSIS — Z803 Family history of malignant neoplasm of breast: Secondary | ICD-10-CM

## 2016-03-10 DIAGNOSIS — Z880 Allergy status to penicillin: Secondary | ICD-10-CM

## 2016-03-10 DIAGNOSIS — Z85528 Personal history of other malignant neoplasm of kidney: Secondary | ICD-10-CM

## 2016-03-10 DIAGNOSIS — M19012 Primary osteoarthritis, left shoulder: Secondary | ICD-10-CM

## 2016-03-10 DIAGNOSIS — Z79899 Other long term (current) drug therapy: Secondary | ICD-10-CM

## 2016-03-10 LAB — BASIC METABOLIC PANEL
ANION GAP: 3 — AB (ref 5–15)
BUN: 24 mg/dL — ABNORMAL HIGH (ref 6–20)
CALCIUM: 12.4 mg/dL — AB (ref 8.9–10.3)
CO2: 29 mmol/L (ref 22–32)
Chloride: 106 mmol/L (ref 101–111)
Creatinine, Ser: 1.83 mg/dL — ABNORMAL HIGH (ref 0.44–1.00)
GFR calc non Af Amer: 28 mL/min — ABNORMAL LOW (ref >60)
GFR, EST AFRICAN AMERICAN: 33 mL/min — AB (ref >60)
GLUCOSE: 129 mg/dL — AB (ref 65–99)
POTASSIUM: 3.5 mmol/L (ref 3.5–5.1)
Sodium: 138 mmol/L (ref 135–145)

## 2016-03-10 MED ORDER — TECHNETIUM TC 99M MEDRONATE IV KIT
25.0000 | PACK | Freq: Once | INTRAVENOUS | Status: AC | PRN
  Administered 2016-03-10: 21.417 via INTRAVENOUS

## 2016-03-10 MED ORDER — FUROSEMIDE 10 MG/ML IJ SOLN
40.0000 mg | Freq: Once | INTRAMUSCULAR | Status: AC
  Administered 2016-03-10: 40 mg via INTRAVENOUS
  Filled 2016-03-10: qty 4

## 2016-03-10 MED ORDER — SENNA 8.6 MG PO TABS
1.0000 | ORAL_TABLET | Freq: Every day | ORAL | Status: DC
  Administered 2016-03-10 – 2016-03-12 (×3): 8.6 mg via ORAL
  Filled 2016-03-10 (×3): qty 1

## 2016-03-10 MED ORDER — POLYETHYLENE GLYCOL 3350 17 G PO PACK
17.0000 g | PACK | Freq: Every day | ORAL | Status: DC
  Administered 2016-03-10 – 2016-03-12 (×3): 17 g via ORAL
  Filled 2016-03-10 (×3): qty 1

## 2016-03-10 MED ORDER — CALCITONIN (SALMON) 200 UNIT/ACT NA SOLN
1.0000 | Freq: Every day | NASAL | Status: DC
  Administered 2016-03-11 – 2016-03-12 (×2): 1 via NASAL
  Filled 2016-03-10: qty 3.7

## 2016-03-10 NOTE — Care Management (Signed)
Admitted to [facility] with the diagnosis of hypercalcemia. Son lives with her. Daughter is Tonette Bihari [phone]). Last seen Dr. Lisette Grinder a few weeks ago. [place] following knee surgery 10/23/15. Rolling walker and cane in the home, if needed. Takes care of all basic and instrumental activities of daily living herself.  Scheduled for Bone Scan and CT of chest today.  Shelbie Ammons RN MSN CCM Care Management [phone]

## 2016-03-10 NOTE — Consult Note (Signed)
[facility]  Date of admission:  03/09/2016  Inpatient day:  03/10/2016  Consulting physician:  Dr. Demetrios Loll   Reason for Consultation:  Anemia and renal cell carcinoma  Chief Complaint: Jill Bond is a 66 y.o. female with a history of renal cell carcinoma who was admitted with acute renal insufficiency and hypercalcemia.  HPI:  The patient notes a history of renal cell carcinoma in 2007.  She underwent left nephrectomy on 03/15/2006.  Pathology is unavailable. She believes she took a "chemotherapy pill".  Baseline creatinine is 0.9 - 1.1 (CrCl 50-63 ml/min).  She presented on 02/18/2016 for her annual wellness exam.  She noted arthritic pain in her left shoulder. Labs from 02/11/2016 revealed a hematocrit of 40.3, hemoglobin 13.5, MCV 90.2, platelets 158,000, white count 4800 with an [place] of 2760.  Comprehensive metabolic panel revealed a creatinine of 1.3 (GFR of 41 ml/min). Calcium was 11.7 (8.7-10.3).  AST was 44 and ALT 50.   Hepatitis serologies, PTH, and SPEP were ordered.   Hepatitis A, B and C serologies were negative.  PTH was 13 (low).  SPEP revealed no monoclonal protein.  Abdominal and pelvic CT scan were ordered.  Abdominal and pelvic CT scan on 02/26/2016 revealed multiple new bibasilar lung nodules measuring up to 5 mm in size.  While benign (infectious/ inflammatory) etiologies were possible, metastatic disease was a concern given their appearance and the patient's history of renal cancer.  There was new para-aortic and gastrohepatic lymph nodes measuring up to 1 cm in size, also indeterminate though suspicious for metastatic disease.  Mammogram on 03/03/2016 revealed no evidence of malignancy.  She had a partial hysterectomy at age 45.  Colonoscopy in 2013 revealed polyps (next scheduled 2018).    She was seen for initial consultation in the medical oncology clinic on 03/09/2016.  She noted fatigue.  She described nausea spells for the past  1-1/2 months. Appetite was poor.  She had lost 30 pounds.  She has had some drenching sweats at night for which she has had to change her clothes.  She described back pain for 3 weeks possibly related to spinal stenosis.  She denied any diarrhea.  She was taking stool softeners for constipation.  She noted significant reflux   Labs from yesterday returned with a creatinine of 2.07 (CrCl 24 ml/min), calcium 13.1, and uric acid 9.1.  She was contacted to return to the hospital for admission secondary to worsening renal function and hypercalcemia.  She has received IVF overnight.  Creatinine is 1.83 with a calcium of 12.4.   She was scheduled for studies ordered in the outpatient department (chest CT and bone scan).    Chest CT on 03/10/2016 revealed numerous bilateral pulmonary nodules, measuring up to 9 mm in the central left lower lobe. While indeterminate, this appearance is suspicious for metastatic disease. Thoracic lymphadenopathy, including a dominant 12 mm short axis node at the left thoracic inlet and a 15 mm short axis right hilar node. Differential considerations include nodal metastasis versus primary bronchogenic neoplasm.  Bone scan on 03/10/2016 revealed no findings to suggest primary or metastatic malignancy within the bones. Uptake in the lower lumbar spine, knees, ankles, and right foot are most compatible with degenerative and postsurgical changes.   Past Medical History:  Diagnosis Date  . Anxiety   . Arthritis   . Cancer ([place])    kidney LEFT  . Chronic kidney disease 2006   Left Nephrectomy  . Complication of anesthesia  PONV  . Constipation   . Depression   . GERD (gastroesophageal reflux disease)   . Headache   . Hypertension   . PONV (postoperative nausea and vomiting)   . Rotator cuff disorder    LEFT  . Shortness of breath dyspnea    with exertion    Past Surgical History:  Procedure Laterality Date  . ABDOMINAL HYSTERECTOMY     PARTIAL  . ACHILLES  TENDON SURGERY Left 02/22/2015   Procedure: Secondary ACHILLES TENDON REPAIR;  Surgeon: Albertine Patricia, DPM;  Location: [facility] ORS;  Service: Podiatry;  Laterality: Left;  . Bladder tac    . BREAST BIOPSY Left 2003   neg  . COLONOSCOPY  2013  . JOINT REPLACEMENT Left 2007   Total Knee Replacement  . OSTECTOMY Left 02/22/2015   Procedure: OSTECTOMY;  Surgeon: Albertine Patricia, DPM;  Location: [facility] ORS;  Service: Podiatry;  Laterality: Left;  . SHOULDER ARTHROSCOPY W/ ROTATOR CUFF REPAIR Left YRS AGO  . TONSILLECTOMY  AGE 61  . TOTAL KNEE ARTHROPLASTY Right 10/22/2015   Procedure: RIGHT TOTAL KNEE ARTHROPLASTY;  Surgeon: Paralee Cancel, MD;  Location: [facility] ORS;  Service: Orthopedics;  Laterality: Right;  . TOTAL KNEE ARTHROPLASTY Right    10/22/2015    Family History  Problem Relation Age of Onset  . Breast cancer Mother 3  . Heart failure Father   . Prostate cancer Father     Social History:  reports that she has never smoked. She has never used smokeless tobacco. She reports that she does not drink alcohol or use drugs.   She denies any exposure to radiation or toxins.  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  Her husband died on 2013/10/04.  Her daughter, [name], is her medical power of attorney.  The patient is alone today.  Allergies:  Allergies  Allergen Reactions  . Codeine Nausea And Vomiting  . Penicillins Hives and Other (See Comments)    Has patient had a PCN reaction causing immediate rash, facial/tongue/throat swelling, SOB or lightheadedness with hypotension: no Has patient had a PCN reaction causing severe rash involving mucus membranes or skin necrosis: no Has patient had a PCN reaction that required hospitalization no Has patient had a PCN reaction occurring within the last 10 years: no If all of the above answers are "NO", then may proceed with Cephalosporin use.     Medications Prior to Admission  Medication Sig Dispense Refill  . amLODipine  (NORVASC) 10 MG tablet Take 10 mg by mouth at bedtime.    . docusate sodium (COLACE) 100 MG capsule Take 200 mg by mouth at bedtime.    . hydrochlorothiazide (HYDRODIURIL) 25 MG tablet Take 25 mg by mouth at bedtime.    Marland Kitchen LORazepam (ATIVAN) 0.5 MG tablet Take 0.5 mg by mouth every 8 (eight) hours as needed for anxiety.   3  . Melatonin 5 MG CAPS Take 5 mg by mouth at bedtime as needed (For sleep.).     Marland Kitchen meloxicam (MOBIC) 15 MG tablet Take 15 mg by mouth daily.     . methocarbamol (ROBAXIN) 500 MG tablet Take 1 tablet (500 mg total) by mouth every 6 (six) hours as needed for muscle spasms. 40 tablet 0  . metoprolol (TOPROL-XL) 200 MG 24 hr tablet Take 100 mg by mouth at bedtime.     . Multiple Vitamin (MULTIVITAMIN WITH MINERALS) TABS tablet Take 1 tablet by mouth daily.    . OMEGA-3 FATTY ACIDS PO  Take 2 capsules by mouth daily.     Marland Kitchen sulfamethoxazole-trimethoprim (BACTRIM DS,SEPTRA DS) 800-160 MG tablet 1 tablet 2 (two) times daily.     . traZODone (DESYREL) 50 MG tablet Take 50 mg by mouth at bedtime as needed for sleep.     Marland Kitchen HYDROcodone-acetaminophen (NORCO) 7.5-325 MG tablet Take 1-2 tablets by mouth every 4 (four) hours as needed for moderate pain. (Patient not taking: Reported on 03/09/2016) 100 tablet 0    Review of Systems: GENERAL:  Fatigue.  Some drenching night sweats.  Weight loss of 30 pounds. PERFORMANCE STATUS (ECOG):  1 HEENT:  Partial loss of vision episodic x 1 1/2 years.  Runny nose and sore throat.  No mouth sores or tenderness. Lungs:  Shortness of breath on exertion.  Cough.  No hemoptysis. Cardiac:  No chest pain, palpitations, orthopnea, or PND. GI:  Nausea spells x 1 1/2 month.  Poor appetite.  Constipation.  Reflux.  No vomiting, diarrhea, melena or hematochezia. GU:  No urgency, frequency, dysuria, or hematuria. Musculoskeletal:  Back pain x 3 weeks (? Secondary to spinal stenosis).  Knee replaced 10/2015.  No muscle tenderness. Extremities:  No pain or  swelling. Skin:  No rashes or skin changes. Neuro:  Headache.  No numbness or weakness.  Balance or coordination off x 2 weeks. Endocrine:  No diabetes, thyroid issues, hot flashes or night sweats. Psych:  No mood changes, depression or anxiety. Pain:  No focal pain. Review of systems:  All other systems reviewed and found to be negative.  Physical Exam:  Blood pressure (!) 162/81, pulse 70, temperature 98.1 F (36.7 C), temperature source Oral, resp. rate 16, height _0  (1.626 m), weight 205 lb 3 oz (93.1 kg), SpO2 100 %.  GENERAL:  Well developed, well nourished, sitting up comfortably on the medical unit in no acute distress. MENTAL STATUS:  Alert and oriented to person, place and time. HEAD:  Brown curly hair.  Normocephalic, atraumatic, face symmetric, no Cushingoid features. EYES:  Glasses.  Blue eyes.  Pupils equal round and reactive to light and accomodation.  No conjunctivitis or scleral icterus. ENT:  Oropharynx clear without lesion.  Tongue normal. Mucous membranes moist.  RESPIRATORY:  Clear to auscultation without rales, wheezes or rhonchi. CARDIOVASCULAR:  Regular rate and rhythm without murmur, rub or gallop. ABDOMEN:  Soft, non-tender with active bowel sounds and no appreciable hepatosplenomegaly.  No masses. SKIN:  No rashes, ulcers or lesions. EXTREMITIES: No edema, no skin discoloration or tenderness.  No palpable cords. LYMPH NODES: No palpable cervical, supraclavicular, axillary or inguinal adenopathy  NEUROLOGICAL:Appropriate. PSYCH:  Appropriate.   Results for orders placed or performed during the hospital encounter of 03/09/16 (from the past 48 hour(s))  Basic metabolic panel     Status: Abnormal   Collection Time: 03/10/16  5:38 AM  Result Value Ref Range   Sodium 138 135 - 145 mmol/L   Potassium 3.5 3.5 - 5.1 mmol/L   Chloride 106 101 - 111 mmol/L   CO2 29 22 - 32 mmol/L   Glucose, Bld 129 (H) 65 - 99 mg/dL   BUN 24 (H) 6 - 20 mg/dL   Creatinine, Ser  1.83 (H) 0.44 - 1.00 mg/dL   Calcium 12.4 (H) 8.9 - 10.3 mg/dL   GFR calc non Af Amer 28 (L) >60 mL/min   GFR calc Af Amer 33 (L) >60 mL/min    Comment: (NOTE) The eGFR has been calculated using the CKD EPI equation. This calculation has not  been validated in all clinical situations. eGFR's persistently <60 mL/min signify possible Chronic Kidney Disease.    Anion gap 3 (L) 5 - 15   Ct Chest Wo Contrast  Result Date: 03/10/2016 CLINICAL DATA:  Follow-up pulmonary nodules on CT abdomen/ pelvis, history of renal cell cancer in 2007 EXAM: CT CHEST WITHOUT CONTRAST TECHNIQUE: Multidetector CT imaging of the chest was performed following the standard protocol without IV contrast. COMPARISON:  Partial comparison to CT abdomen/ pelvis dated 02/25/2016 FINDINGS: Cardiovascular: The heart is normal in size. No pericardial effusion. Mild coronary atherosclerosis in the LAD (series 2/ image 61). Mild atherosclerotic calcifications of the aortic arch. Mediastinum/Nodes: Thoracic lymphadenopathy, including: --12 mm short axis node at the left thoracic inlet (series 2/ image 14) --11 mm short axis high right paratracheal node (series 2/ image 33) --12 mm short axis low right paratracheal node (series 2/ image 42) --15 mm short axis right hilar node (series 2/image 50) Visualized thyroid is unremarkable. Lungs/Pleura: Numerous bilateral pulmonary nodules, including: --6 mm central left upper lobe nodule (series 3/ image 34) --9 mm central left lower lobe nodule (series 3/ image 47) --6 mm posterior left upper lobe nodule (series 3/image 50) --6 mm central left upper lobe nodule (series 3/image 60) --8 mm central right middle lobe nodule (series 3/ image 62) --6 mm subpleural right lower lobe nodule (series 3/image 76) Mild emphysematous changes. No focal consolidation. No pleural effusion or pneumothorax. Upper Abdomen: Unchanged from recent CT abdomen/pelvis. Musculoskeletal: Mild degenerative changes of the  visualized thoracolumbar spine. IMPRESSION: Numerous bilateral pulmonary nodules, measuring up to 9 mm in the central left lower lobe. While indeterminate, this appearance is suspicious for metastatic disease. Thoracic lymphadenopathy, including a dominant 12 mm short axis node at the left thoracic inlet and a 15 mm short axis right hilar node, as above. Differential considerations include nodal metastasis versus primary bronchogenic neoplasm. Consider percutaneous sampling of a supraclavicular node for tissue diagnosis. PET-CT could also be considered for staging, as clinically warranted. Electronically Signed   By: Julian Hy M.D.   On: 03/10/2016 11:30   Nm Bone Scan Whole Body  Result Date: 03/10/2016 CLINICAL DATA:  History of hypercalcemia, lung nodules, previous left ankle wall and bilateral knee replacement and right toe surgery. EXAM: NUCLEAR MEDICINE WHOLE BODY BONE SCAN TECHNIQUE: Whole body anterior and posterior images were obtained approximately 3 hours after intravenous injection of radiopharmaceutical. RADIOPHARMACEUTICALS:  21.417 mCi Technetium-49mMDP IV COMPARISON:  None in PACs FINDINGS: There is adequate uptake of the radiopharmaceutical by the skeleton. There is adequate soft tissue clearance. The left kidney is surgically absent for malignancy. There is mild compensatory hypertrophy of the right kidney. Uptake of the radiopharmaceutical within the calvarium and cervical and thoracic spine is normal. No abnormal rib uptake is observed. Uptake within the visualized portions of the upper extremities is normal. There is mildly increased uptake at the L5-S1 level. Uptake within the pelvis is normal. There is mildly increased uptake about both prosthetic knee joints, both ankles, and in the right midfoot and right first MTP joint. IMPRESSION: No findings to suggest primary or metastatic malignancy within the bones. Uptake in the lower lumbar spine, knees, ankles, and right foot are most  compatible with degenerative and postsurgical changes. Electronically Signed   By: David  JMartiniqueM.D.   On: 03/10/2016 14:29    Assessment:  The patient is a 65y.o.  woman with a history of renal cell carcinoma s/p left nephrectomy (03/2006).  She has  small upper abdominal adenopathy and bilateral pulmonary nodules worrisome for metastatic disease.  She notes a 30 pound weight loss.   Abdominal and pelvic CT scan on 02/26/2016 revealed multiple new bibasilar lung nodules measuring up to 5 mm in size. There was new para-aortic and gastrohepatic lymph nodes measuring up to 1 cm in size, suspicious for metastatic disease.  Chest CT on 03/10/2016 revealed numerous bilateral pulmonary nodules (up to 9 mm in the central left lower lobe).  There was thoracic lymphadenopathy, including a dominant 12 mm short axis node at the left thoracic inlet and a 15 mm short axis right hilar node.  She has progressive renal insufficiency (baseline creatinine is 0.9 - 1.1) and worsening hypercalcemia.  Creatinine increased to 2.07 on 03/09/2016.  Calcium has increased from 11.7 to 13.1.  PTH is low. PTH-rp is pending.  Mammogram (02/2016) and colonoscopy (2013) revealed no evidence of malignancy.  She had a partial hysterectomy at age 43.   Symptomatically, she has a  2 month history of fatigue, nausea, drenching sweats, and constipation.  She has worsening reflux.  Symptoms are concerning for malignancy and progressive hypercalcemia.  Plan:   1.  Oncology:  Reviewed chest CT with patient.  Scans were reviewed with radiology.  Discussed with patient plan for ultrasound guided supraclavicular lymph node biopsy in AM.  Patient in agreement.  Plan to hold heparin DVT prophylaxis tonight and NPO after midnight.  2.  Renal:  Renal insufficiency improving with hydration with Lasix x 1.  Patient started on calcitonin.  Calcium decreased to 12.4.  3.  Neurology:  Currently monitoring headaches.  Neurologic exam  non-focal.  Consider head imaging if biopsy + metastatic disease.  4.  Disposition:  Anticipate discharge in next 1-2 days.   Thank you for allowing me to participate in WM FRUCHTER 's care.  I will follow her closely with you while hospitalized and after discharge in the outpatient department.  Lequita Asal, MD  03/10/2016, 4:37 PM

## 2016-03-10 NOTE — Progress Notes (Signed)
Initial Nutrition Assessment  DOCUMENTATION CODES:   Severe malnutrition in context of chronic illness  INTERVENTION:  Patient was not agreeable to any ONS or snacks to be ordered at this time. Claims she can eat good, but "doesn't have an appetite." Recommend we try Megace or another appetite stimulant with patient to improve appetite given hypercalcemia - high chance for malignancy  NUTRITION DIAGNOSIS:   Malnutrition related to chronic illness as evidenced by energy intake < 75% for > or equal to 1 month, percent weight loss.  GOAL:   Patient will meet greater than or equal to 90% of their needs  MONITOR:   PO intake, I & O's, Labs, Weight trends  REASON FOR ASSESSMENT:   Malnutrition Screening Tool    ASSESSMENT:   Jill Bond  is a 65 y.o. female with a known history of Renal carcinoma, status post the left nephrectomy, hypertension and CKD. The patient got a CAT scan of abdomen and the polys on 02/26/2016, which showed multiple new basilar lung nodules. Dr. Susy Manor has concerns about metastatic disease due to history of renal cancer.  Spoke with Jill Bond and her cousin at bedside. Patient admits to a 36# wt loss over 6 weeks - per chart she exhibits an 18#/8% severe wt loss over 4.5 months PO intake has been very poor, patient may eat 1 meal per day with 2-3 bites, then stops eating. She admits to some nausea/vomiting prior to admission along with severe acid reflux, but mostly blames poor PO intake on her lack of appetite. Denies nausea/vomiting now She had a little bit of soup last night and a few bites of a club sandwich with a graham cracker. Difficult patient, was not amenable to any interventions at this time. Given poor PO intake and weight loss patient is severely malnourished in context of chronic illness Nutrition-Focused physical exam completed. Findings are no fat depletion, mild-moderate muscle depletion, and no edema.   Labs and medications  reviewed: Colace, Senokot NS @ 135mL/hr  Diet Order:  Diet Heart Room service appropriate? Yes; Fluid consistency: Thin  Skin:  Reviewed, no issues  Last BM:  10/30  Height:   Ht Readings from Last 1 Encounters:  03/09/16 5\' 4"  (1.626 m)    Weight:   Wt Readings from Last 1 Encounters:  03/10/16 205 lb 3 oz (93.1 kg)    Ideal Body Weight:  54.54 kg  BMI:  Body mass index is 35.22 kg/m.  Estimated Nutritional Needs:   Kcal:  1900-2250 calories  Protein:  120-140 gm  Fluid:  >/= 1.9L  EDUCATION NEEDS:   Education needs addressed  Satira Anis. Clemens Lachman, MS, RD LDN Inpatient Clinical Dietitian Pager [phone]

## 2016-03-10 NOTE — Progress Notes (Signed)
CH was referred to the Pt by the on-call [place]. Pt has already filled out the information with the AD but is not quite ready to make it official. At the time of my visit Pt was calm and reflective. Our visit was interrupted as she had a CT scan scheduled. We left it that I would wait for a call from her as to when to proceed. CH is available for follow up as needed.    03/10/16 1000  Clinical Encounter Type  Visited With Patient  Visit Type Follow-up;Psychological support;Spiritual support  Referral From Chaplain  Spiritual Encounters  Spiritual Needs Literature;Emotional  Stress Factors  Patient Stress Factors Exhausted;Family relationships

## 2016-03-10 NOTE — Progress Notes (Signed)
Palliative:  We have received consult. Will visit with patient 03/11/16. Thank you for this consult.   Vinie Sill, NP Palliative Medicine Team Pager # [phone] (M-F 8a-5p) Team Phone # [phone] (Nights/Weekends)

## 2016-03-10 NOTE — Care Management Important Message (Signed)
Important Message  Patient Details  Name: AZIAH CEPERO MRN: [id number] Date of Birth: 09-May-1951   Medicare Important Message Given:  Yes    Shelbie Ammons, RN 03/10/2016, 8:25 AM

## 2016-03-10 NOTE — Progress Notes (Signed)
[facility] NAME: Jill Bond    MR#:  [id number]  DATE OF BIRTH:  April 03, 1951  SUBJECTIVE:  CHIEF COMPLAINT:  No chief complaint on file. Patient is 65 year old Caucasian female with past medical history significant for history of renal cell carcinoma status post left nephrectomy in 2007 who presents to the hospital after she was found to have hypercalcemia and acute renal failure by oncologist who she saw for a small upper abdominal adenopathy and bibasilar lung nodules. Patient reported 30 pound weight loss and drenching night sweats. Her labs revealed acute renal failure and hypercalcemia. Her baseline creatinine was 0.9-1.1, however, was 1.3 with GFR of 41 in October 2017  Review of Systems  Constitutional: Positive for diaphoresis and weight loss. Negative for chills and fever.  HENT: Negative for congestion.   Eyes: Negative for blurred vision and double vision.  Respiratory: Negative for cough, sputum production, shortness of breath and wheezing.   Cardiovascular: Negative for chest pain, palpitations, orthopnea, leg swelling and PND.  Gastrointestinal: Positive for constipation. Negative for abdominal pain, blood in stool, diarrhea, nausea and vomiting.  Genitourinary: Negative for dysuria, frequency, hematuria and urgency.  Musculoskeletal: Negative for falls.  Neurological: Negative for dizziness, tremors, focal weakness and headaches.  Endo/Heme/Allergies: Does not bruise/bleed easily.  Psychiatric/Behavioral: Negative for depression. The patient does not have insomnia.     VITAL SIGNS: Blood pressure (!) 151/82, pulse 65, temperature 98.3 F (36.8 C), temperature source Oral, resp. rate 17, height 5\' 4"  (1.626 m), weight 93.1 kg (205 lb 3 oz), SpO2 98 %.  PHYSICAL EXAMINATION:   GENERAL:  65 y.o.-year-old patient lying in the bed with no acute distress. Dry mucosa.  EYES: Pupils equal, round, reactive to light  and accommodation. No scleral icterus. Extraocular muscles intact.  HEENT: Head atraumatic, normocephalic. Oropharynx and nasopharynx clear.  NECK:  Supple, no jugular venous distention. No thyroid enlargement, no tenderness.  LUNGS: Normal breath sounds bilaterally, no wheezing, rales,rhonchi or crepitation. No use of accessory muscles of respiration.  CARDIOVASCULAR: S1, S2 normal. No murmurs, rubs, or gallops.  ABDOMEN: Soft, nontender, nondistended. Bowel sounds present. No organomegaly or mass.  EXTREMITIES: No pedal edema, cyanosis, or clubbing.  NEUROLOGIC: Cranial nerves II through XII are intact. Muscle strength 5/5 in all extremities. Sensation intact. Gait not checked.  PSYCHIATRIC: The patient is alert and oriented x 3.  SKIN: No obvious rash, lesion, or ulcer.   ORDERS/RESULTS REVIEWED:   CBC  Recent Labs Lab 03/09/16 1035  WBC 5.2  HGB 13.5  HCT 40.0  PLT 166  MCV 87.6  MCH 29.6  MCHC 33.8  RDW 15.0*  LYMPHSABS 0.8*  MONOABS 0.7  EOSABS 0.3  BASOSABS 0.0   ------------------------------------------------------------------------------------------------------------------  Chemistries   Recent Labs Lab 03/09/16 1035 03/10/16 0538  NA 137 138  K 3.9 3.5  CL 98* 106  CO2 28 29  GLUCOSE 114* 129*  BUN 31* 24*  CREATININE 2.07* 1.83*  CALCIUM 13.1* 12.4*  AST 43*  --   ALT 51  --   ALKPHOS 105  --   BILITOT 0.9  --    ------------------------------------------------------------------------------------------------------------------ estimated creatinine clearance is 34.4 mL/min (by C-G formula based on SCr of 1.83 mg/dL (H)). ------------------------------------------------------------------------------------------------------------------ No results for input(s): TSH, T4TOTAL, T3FREE, THYROIDAB in the last 72 hours.  Invalid input(s): FREET3  Cardiac Enzymes No results for input(s): CKMB, TROPONINI, MYOGLOBIN in the last 168 hours.  Invalid input(s):  CK ------------------------------------------------------------------------------------------------------------------ Invalid input(s):  POCBNP ---------------------------------------------------------------------------------------------------------------  RADIOLOGY: Ct Chest Wo Contrast  Result Date: 03/10/2016 CLINICAL DATA:  Follow-up pulmonary nodules on CT abdomen/ pelvis, history of renal cell cancer in 2007 EXAM: CT CHEST WITHOUT CONTRAST TECHNIQUE: Multidetector CT imaging of the chest was performed following the standard protocol without IV contrast. COMPARISON:  Partial comparison to CT abdomen/ pelvis dated 02/25/2016 FINDINGS: Cardiovascular: The heart is normal in size. No pericardial effusion. Mild coronary atherosclerosis in the LAD (series 2/ image 61). Mild atherosclerotic calcifications of the aortic arch. Mediastinum/Nodes: Thoracic lymphadenopathy, including: --12 mm short axis node at the left thoracic inlet (series 2/ image 14) --11 mm short axis high right paratracheal node (series 2/ image 33) --12 mm short axis low right paratracheal node (series 2/ image 42) --15 mm short axis right hilar node (series 2/image 50) Visualized thyroid is unremarkable. Lungs/Pleura: Numerous bilateral pulmonary nodules, including: --6 mm central left upper lobe nodule (series 3/ image 34) --9 mm central left lower lobe nodule (series 3/ image 47) --6 mm posterior left upper lobe nodule (series 3/image 50) --6 mm central left upper lobe nodule (series 3/image 60) --8 mm central right middle lobe nodule (series 3/ image 62) --6 mm subpleural right lower lobe nodule (series 3/image 76) Mild emphysematous changes. No focal consolidation. No pleural effusion or pneumothorax. Upper Abdomen: Unchanged from recent CT abdomen/pelvis. Musculoskeletal: Mild degenerative changes of the visualized thoracolumbar spine. IMPRESSION: Numerous bilateral pulmonary nodules, measuring up to 9 mm in the central left lower  lobe. While indeterminate, this appearance is suspicious for metastatic disease. Thoracic lymphadenopathy, including a dominant 12 mm short axis node at the left thoracic inlet and a 15 mm short axis right hilar node, as above. Differential considerations include nodal metastasis versus primary bronchogenic neoplasm. Consider percutaneous sampling of a supraclavicular node for tissue diagnosis. PET-CT could also be considered for staging, as clinically warranted. Electronically Signed   By: Julian Hy M.D.   On: 03/10/2016 11:30    EKG:  Orders placed or performed during the hospital encounter of 02/12/15  . EKG 12-Lead  . EKG 12-Lead    ASSESSMENT AND PLAN:  Active Problems:   Hypercalcemia 1. Hypercalcemia of malignancy, improved with IV fluid administration,follow closely, oncology consultation is obtained, pending recommendations, get PTH rp 2. Acute renal failure due to hypercalcemia, improving with IV fluid administration, follow in the morning, get urinalysis 3. Metastatic carcinomatosis, oncologist is consulted, possible percutaneous sampling of supraclavicular node for tissue diagnosis. Palliative care consulted 4. Constipation, initiate patient on Colace, Senokot, MiraLAX 5. Essential hypertension, continue Norvasc, advance it as needed while patient is on IV fluids at high rate    Management plans discussed with the patient, family and they are in agreement.   DRUG ALLERGIES:  Allergies  Allergen Reactions  . Codeine Nausea And Vomiting  . Penicillins Hives and Other (See Comments)    Has patient had a PCN reaction causing immediate rash, facial/tongue/throat swelling, SOB or lightheadedness with hypotension: no Has patient had a PCN reaction causing severe rash involving mucus membranes or skin necrosis: no Has patient had a PCN reaction that required hospitalization no Has patient had a PCN reaction occurring within the last 10 years: no If all of the above  answers are "NO", then may proceed with Cephalosporin use.     CODE STATUS:     Code Status Orders        Start     Ordered   03/09/16 1836  Full code  Continuous  03/09/16 1836    Code Status History    Date Active Date Inactive Code Status Order ID Comments User Context   This patient has a current code status but no historical code status.      TOTAL TIME TAKING CARE OF THIS PATIENT: 40 minutes.    Theodoro Grist M.D on 03/10/2016 at 12:53 PM  Between 7am to 6pm - Pager - [phone]  After 6pm go to [email or web address] - password [email or web address] [facility]  Office  [phone]  CC: Primary care physician; Madelyn Brunner, MD

## 2016-03-11 ENCOUNTER — Inpatient Hospital Stay: Payer: Commercial Managed Care - HMO

## 2016-03-11 DIAGNOSIS — Z9889 Other specified postprocedural states: Secondary | ICD-10-CM

## 2016-03-11 DIAGNOSIS — Z515 Encounter for palliative care: Secondary | ICD-10-CM

## 2016-03-11 DIAGNOSIS — N179 Acute kidney failure, unspecified: Secondary | ICD-10-CM

## 2016-03-11 DIAGNOSIS — L929 Granulomatous disorder of the skin and subcutaneous tissue, unspecified: Secondary | ICD-10-CM | POA: Insufficient documentation

## 2016-03-11 DIAGNOSIS — K5900 Constipation, unspecified: Secondary | ICD-10-CM

## 2016-03-11 DIAGNOSIS — Z7189 Other specified counseling: Secondary | ICD-10-CM

## 2016-03-11 LAB — URINALYSIS COMPLETE WITH MICROSCOPIC (ARMC ONLY)
BILIRUBIN URINE: NEGATIVE
Glucose, UA: NEGATIVE mg/dL
HGB URINE DIPSTICK: NEGATIVE
KETONES UR: NEGATIVE mg/dL
LEUKOCYTES UA: NEGATIVE
NITRITE: NEGATIVE
PH: 7 (ref 5.0–8.0)
PROTEIN: NEGATIVE mg/dL
RBC / HPF: NONE SEEN RBC/hpf (ref 0–5)
Specific Gravity, Urine: 1.005 (ref 1.005–1.030)

## 2016-03-11 LAB — BASIC METABOLIC PANEL
Anion gap: 5 (ref 5–15)
BUN: 23 mg/dL — ABNORMAL HIGH (ref 6–20)
CALCIUM: 11.9 mg/dL — AB (ref 8.9–10.3)
CO2: 31 mmol/L (ref 22–32)
CREATININE: 1.58 mg/dL — AB (ref 0.44–1.00)
Chloride: 104 mmol/L (ref 101–111)
GFR calc non Af Amer: 34 mL/min — ABNORMAL LOW (ref >60)
GFR, EST AFRICAN AMERICAN: 39 mL/min — AB (ref >60)
Glucose, Bld: 99 mg/dL (ref 65–99)
Potassium: 3.6 mmol/L (ref 3.5–5.1)
SODIUM: 140 mmol/L (ref 135–145)

## 2016-03-11 MED ORDER — MIDAZOLAM HCL 5 MG/5ML IJ SOLN
INTRAMUSCULAR | Status: AC
  Filled 2016-03-11: qty 5

## 2016-03-11 MED ORDER — FENTANYL CITRATE (PF) 100 MCG/2ML IJ SOLN
INTRAMUSCULAR | Status: DC
Start: 2016-03-11 — End: 2016-03-11
  Filled 2016-03-11: qty 2

## 2016-03-11 MED ORDER — HYDROCODONE-ACETAMINOPHEN 7.5-325 MG PO TABS: 1.0000 | ORAL_TABLET | ORAL | 0 refills | Status: DC | PRN

## 2016-03-11 MED ORDER — POLYETHYLENE GLYCOL 3350 17 G PO PACK: 17.0000 g | PACK | Freq: Every day | ORAL | 0 refills | Status: DC

## 2016-03-11 MED ORDER — SENNA 8.6 MG PO TABS: 1.0000 | ORAL_TABLET | Freq: Every day | ORAL | 0 refills | Status: DC

## 2016-03-11 MED ORDER — CALCITONIN (SALMON) 200 UNIT/ACT NA SOLN: 1.0000 | Freq: Every day | NASAL | 12 refills | Status: DC

## 2016-03-11 NOTE — Consult Note (Signed)
Consultation Note Date: 03/12/2016   Patient Name: Jill Bond  DOB: [birth year]  MRN: [id number]  Age / Sex: 65 y.o., female  PCP: Madelyn Brunner, MD Referring Physician: Theodoro Grist, MD  Reason for Consultation: Establishing goals of care  HPI/Patient Profile: 65 y.o. female  with past medical history of renal carcinoma s/p left nephrectomy, chronic kidney disease, hypertension, depression, constipation, arthritis, and anxiety admitted on 03/09/2016 with poor oral intake, nausea, and vomiting. She has lost 30 pounds in 6 weeks. On 02/26/16 she had a CT scan of abdomen/pelvis revealing multiple new basilar lung nodules. Dr. Mike Gip sent patient to the hospital after labs resulted (creatinine of 2.07 (CrCl 24 ml/min), calcium 13.1, and uric acid 9.1) with worsening renal function and hypercalcemia. Chest CT on 03/10/16 revealing numerous bilateral pulmonary nodules measuring up to 62m suspicious for metastatic disease. Also thoracic lymphadenopathy. Per Dr. CMike Gip differentials include nodal metastasis versus primary bronchogenic neoplasm. Bone scan on 10/31 negative. Pending supraclavicular lymph node biopsy. Palliative medicine consultation for goals of care.   Clinical Assessment and Goals of Care: AVinie Sill NP and I met with [name] at bedside. Patient is alert, oriented and is in good spirits with her family at bedside. She denies pain or discomfort.   Introduced palliative care and the support we will provide during hospitalization. Family in and out throughout the conversation (daughter and other relatives). Ms. HBambarecalls many life stories. Her husband died 364 monthsago and she is having difficulty coping with his loss. She tells u[place]ore[place]he misses him more every day. Ms. HMccahillhas two children, a daughter and son. She has complicated family dynamics, especially with her son who seems to  cause her a lot of stress (in and out of jail and with four children by four different mothers). She turns to her daughter for support and would like her to make decisions for her if she was unable to do so.   Briefly discussed advanced directives and code status but conversation kept getting sidetracked. Ms. HBrileyis not afraid to die and ready to be reunited when the time comes. Her only fear is that she would leave her family behind. She tells [place]at her husband was on life support for a few weeks and she would not want to "go through what he went through." Conversation needs to be re-addressed.  Patient tearful throughout conversation. She understands that she has lung nodules but not sure how bad this is yet. Waiting on test results. She has a strong faith and believes she is not in this alone. Offered emotional support. She is appreciative of palliative services.    NEXT OF KIN-daughter   SUMMARY OF RECOMMENDATIONS   PMT will continue to follow patient during hospitalization to discuss goals of care and advanced directives/code status.    Palliative services should follow patient upon discharge home to continue these conversations.   Code Status/Advance Care Planning:  Full code   Symptom Management:   Per attending  Additional Recommendations (Limitations,  Scope, Preferences):  Full Scope Treatment   Psycho-social/Spiritual:   Desire for further Chaplaincy support:yes  Additional Recommendations: Caregiving  Support/Resources  Prognosis:   Unable to determine  Discharge Planning: Home with Palliative Services      Primary Diagnoses: Present on Admission: . Hypercalcemia   I have reviewed the medical record, interviewed the patient and family, and examined the patient. The following aspects are pertinent.  Past Medical History:  Diagnosis Date  . Anxiety   . Arthritis   . Cancer ([place])    kidney LEFT  . Chronic kidney disease 2006   Left Nephrectomy  .  Complication of anesthesia    PONV  . Constipation   . Depression   . GERD (gastroesophageal reflux disease)   . Headache   . Hypertension   . PONV (postoperative nausea and vomiting)   . Rotator cuff disorder    LEFT  . Shortness of breath dyspnea    with exertion   Social History   Social History  . Marital status: Widowed    Spouse name: N/A  . Number of children: N/A  . Years of education: N/A   Social History Main Topics  . Smoking status: Never Smoker  . Smokeless tobacco: Never Used  . Alcohol use No  . Drug use: No  . Sexual activity: Not Asked   Other Topics Concern  . None   Social History Narrative  . None   Family History  Problem Relation Age of Onset  . Breast cancer Mother 49  . Heart failure Father   . Prostate cancer Father    Scheduled Meds: . amLODipine  10 mg Oral QHS  . calcitonin (salmon)  1 spray Alternating Nares Daily  . docusate sodium  200 mg Oral QHS  . heparin  5,000 Units Subcutaneous Q8H  . metoprolol  100 mg Oral QHS  . pantoprazole  40 mg Oral Daily  . polyethylene glycol  17 g Oral Daily  . potassium chloride  40 mEq Oral Once  . senna  1 tablet Oral Daily   Continuous Infusions: . sodium chloride 150 mL/hr at 03/12/16 0522   PRN Meds:.acetaminophen **OR** acetaminophen, albuterol, LORazepam, Melatonin, methocarbamol, ondansetron **OR** ondansetron (ZOFRAN) IV, traZODone Medications Prior to Admission:  Prior to Admission medications   Medication Sig Start Date End Date Taking? Authorizing Provider  amLODipine (NORVASC) 10 MG tablet Take 10 mg by mouth at bedtime.   Yes Historical Provider, MD  docusate sodium (COLACE) 100 MG capsule Take 200 mg by mouth at bedtime.   Yes Historical Provider, MD  hydrochlorothiazide (HYDRODIURIL) 25 MG tablet Take 25 mg by mouth at bedtime.   Yes Historical Provider, MD  LORazepam (ATIVAN) 0.5 MG tablet Take 0.5 mg by mouth every 8 (eight) hours as needed for anxiety.  07/03/15  Yes  Historical Provider, MD  Melatonin 5 MG CAPS Take 5 mg by mouth at bedtime as needed (For sleep.).    Yes Historical Provider, MD  meloxicam (MOBIC) 15 MG tablet Take 15 mg by mouth daily.  03/25/15  Yes Historical Provider, MD  methocarbamol (ROBAXIN) 500 MG tablet Take 1 tablet (500 mg total) by mouth every 6 (six) hours as needed for muscle spasms. 10/23/15  Yes Danae Orleans, PA-C  metoprolol (TOPROL-XL) 200 MG 24 hr tablet Take 100 mg by mouth at bedtime.    Yes Historical Provider, MD  Multiple Vitamin (MULTIVITAMIN WITH MINERALS) TABS tablet Take 1 tablet by mouth daily.   Yes Historical Provider, MD  OMEGA-3 FATTY ACIDS PO Take 2 capsules by mouth daily.    Yes Historical Provider, MD  sulfamethoxazole-trimethoprim (BACTRIM DS,SEPTRA DS) 800-160 MG tablet 1 tablet 2 (two) times daily.  02/15/16  Yes Historical Provider, MD  traZODone (DESYREL) 50 MG tablet Take 50 mg by mouth at bedtime as needed for sleep.    Yes Historical Provider, MD  calcitonin, salmon, (MIACALCIN/FORTICAL) 200 UNIT/ACT nasal spray Place 1 spray into alternate nostrils daily. 03/12/16   Theodoro Grist, MD  HYDROcodone-acetaminophen (NORCO) 7.5-325 MG tablet Take 1-2 tablets by mouth every 4 (four) hours as needed for moderate pain. 03/11/16   Theodoro Grist, MD  polyethylene glycol (MIRALAX / GLYCOLAX) packet Take 17 g by mouth daily. 03/12/16   Theodoro Grist, MD  senna (SENOKOT) 8.6 MG TABS tablet Take 1 tablet (8.6 mg total) by mouth daily. 03/12/16   Theodoro Grist, MD   Allergies  Allergen Reactions  . Codeine Nausea And Vomiting  . Penicillins Hives and Other (See Comments)    Has patient had a PCN reaction causing immediate rash, facial/tongue/throat swelling, SOB or lightheadedness with hypotension: no Has patient had a PCN reaction causing severe rash involving mucus membranes or skin necrosis: no Has patient had a PCN reaction that required hospitalization no Has patient had a PCN reaction occurring within the last  10 years: no If all of the above answers are "NO", then may proceed with Cephalosporin use.    Review of Systems  Constitutional: Positive for activity change, appetite change, fatigue and unexpected weight change.  Gastrointestinal: Positive for nausea.  Neurological: Positive for weakness.   Physical Exam  Constitutional: She is oriented to person, place, and time. She appears well-developed. She is cooperative.  Cardiovascular: Regular rhythm.   Pulmonary/Chest: Effort normal. No accessory muscle usage. No tachypnea. No respiratory distress.  Abdominal: Soft. Bowel sounds are normal.  Neurological: She is alert and oriented to person, place, and time.  Skin: Skin is warm and dry.  Psychiatric: She has a normal mood and affect. Her speech is normal and behavior is normal. Thought content normal.  Nursing note and vitals reviewed.  Vital Signs: BP (!) 158/89 (BP Location: Left Arm)   Pulse 69   Temp 98 F (36.7 C) (Oral)   Resp 18   Ht '5\' 4"'$  (1.626 m)   Wt 93.1 kg (205 lb 3 oz)   SpO2 99%   BMI 35.22 kg/m  Pain Assessment: 0-10   Pain Score: 3    SpO2: SpO2: 99 % O2 Device:SpO2: 99 % O2 Flow Rate: .   IO: Intake/output summary:   Intake/Output Summary (Last 24 hours) at 03/12/16 0815 Last data filed at 03/12/16 0530  Gross per 24 hour  Intake              480 ml  Output             2750 ml  Net            -2270 ml    LBM: Last BM Date: 03/11/16 Baseline Weight: Weight: 93.2 kg (205 lb 7.5 oz) Most recent weight: Weight: 93.1 kg (205 lb 3 oz)     Palliative Assessment/Data: PPS 70%    Flowsheet Rows   Flowsheet Row Most Recent Value  Intake Tab  Referral Department  Hospitalist  Unit at Time of Referral  Oncology Unit  Palliative Care Primary Diagnosis  Cancer  Palliative Care Type  New Palliative care  Reason for referral  Clarify Goals of Care  Clinical Assessment  Palliative Performance Scale Score  70%  Psychosocial & Spiritual Assessment    Palliative Care Outcomes  Patient/Family meeting held?  Yes  Who was at the meeting?  patient and relatives  Palliative Care Outcomes  Clarified goals of care, Provided psychosocial or spiritual support, Provided advance care planning      Time In: 0930 Time Out: 1040 Time Total: 64mn Greater than 50%  of this time was spent counseling and coordinating care related to the above assessment and plan.  Signed by:  MIhor Dow FNP-C Palliative Medicine Team  Phone: [phone]: [phone]  Please contact Palliative Medicine Team phone at [phone] questions and concerns.  For individual provider: See AShea Evans

## 2016-03-11 NOTE — Procedures (Signed)
[place] FNA and subsequent core biopsy of right supraclavicular node.  Complications:  None  Blood Loss: none  See dictation in canopy pacs

## 2016-03-11 NOTE — Progress Notes (Signed)
Post IR Procedure Consult  Pt on SQ heparin (order not d/c'd) who underwent low bleed risk procedure. Per guidelines, prophylactic unfractionated heparin to start at least 4 hours or at next standard dose interval. Next dose at 1400 which is >4 hours away.

## 2016-03-11 NOTE — Care Management (Signed)
I noticed a difference in Ms.Ferrall's disposition when inquiring of her whether or not she would like to complete the Advance Directive. I asked because my fellow Chaplain told she seemed indifferent yesterday and I wanted to follow up. When asked today she had a weird look on her face while looking at me while her daughter was in the room. I will try to visit with her privately to see if something else is going on. Her 1st cousinis bedside and is serving as her Caregiver and aid right now. She told me she had biopsy performed and are waiting for the test results to come back.

## 2016-03-12 DIAGNOSIS — Z7189 Other specified counseling: Secondary | ICD-10-CM

## 2016-03-12 DIAGNOSIS — Z515 Encounter for palliative care: Secondary | ICD-10-CM

## 2016-03-12 LAB — CBC
HEMATOCRIT: 37.3 % (ref 35.0–47.0)
Hemoglobin: 12.8 g/dL (ref 12.0–16.0)
MCH: 30.3 pg (ref 26.0–34.0)
MCHC: 34.3 g/dL (ref 32.0–36.0)
MCV: 88.3 fL (ref 80.0–100.0)
PLATELETS: 134 10*3/uL — AB (ref 150–440)
RBC: 4.23 MIL/uL (ref 3.80–5.20)
RDW: 14.3 % (ref 11.5–14.5)
WBC: 3.6 10*3/uL (ref 3.6–11.0)

## 2016-03-12 LAB — BASIC METABOLIC PANEL
ANION GAP: 6 (ref 5–15)
BUN: 20 mg/dL (ref 6–20)
CALCIUM: 11.7 mg/dL — AB (ref 8.9–10.3)
CO2: 27 mmol/L (ref 22–32)
CREATININE: 1.36 mg/dL — AB (ref 0.44–1.00)
Chloride: 106 mmol/L (ref 101–111)
GFR calc Af Amer: 47 mL/min — ABNORMAL LOW (ref >60)
GFR, EST NON AFRICAN AMERICAN: 40 mL/min — AB (ref >60)
GLUCOSE: 124 mg/dL — AB (ref 65–99)
Potassium: 3.4 mmol/L — ABNORMAL LOW (ref 3.5–5.1)
Sodium: 139 mmol/L (ref 135–145)

## 2016-03-12 MED ORDER — POTASSIUM CHLORIDE CRYS ER 20 MEQ PO TBCR
40.0000 meq | EXTENDED_RELEASE_TABLET | Freq: Once | ORAL | Status: AC
  Administered 2016-03-12: 10:00:00 40 meq via ORAL
  Filled 2016-03-12: qty 2

## 2016-03-12 NOTE — Discharge Summary (Signed)
[facility] NAME: Jill Bond    MR#:  [id number]  DATE OF BIRTH:  1951-02-17  DATE OF ADMISSION:  03/09/2016 ADMITTING PHYSICIAN: Demetrios Loll, MD  DATE OF DISCHARGE: 03/12/2016 11:00 AM  PRIMARY CARE PHYSICIAN: Madelyn Brunner, MD     ADMISSION DIAGNOSIS:  renal failure weight loss hypercalcemia hx renal cell carcinoma  DISCHARGE DIAGNOSIS:  Principal Problem:   Hypercalcemia Active Problems:   Acute renal failure (HCC)   Metastatic carcinoma (HCC)   S/P lymph node biopsy   Constipation   Palliative care encounter   Goals of care, counseling/discussion   SECONDARY DIAGNOSIS:   Past Medical History:  Diagnosis Date  . Anxiety   . Arthritis   . Cancer ([place])    kidney LEFT  . Chronic kidney disease 2006   Left Nephrectomy  . Complication of anesthesia    PONV  . Constipation   . Depression   . GERD (gastroesophageal reflux disease)   . Headache   . Hypertension   . PONV (postoperative nausea and vomiting)   . Rotator cuff disorder    LEFT  . Shortness of breath dyspnea    with exertion    .pro HOSPITAL COURSE:  Patient is 65 year old Caucasian female with past medical history significant for history of renal cell carcinoma status post left nephrectomy in 2007 who presents to the hospital after she was found to have hypercalcemia and acute renal failure by oncologist who she saw for a small upper abdominal adenopathy and bibasilar lung nodules. Patient reported 30 pound weight loss and drenching night sweats. Her labs revealed acute renal failure and hypercalcemia. Initial creatinine was 2.07, estimated GFR of 24, however with rehydration and calcitonin her creatinine has improved to 1.36 on the day of discharge, with estimated GFR of 40. The patient was seen by Dr. Mike Gip who recommended to get ultrasound guided FNA and subsequent core biopsy of right subclavicular node, which was performed on  03/11/2016. Postprocedure, patient did well, denied any significant pain and was ready to be discharged home for oncologist. Follow-up as outpatient Discussion by problem: 1. Hypercalcemia , worrisome for malignancy, PTH related peptide is pending, improved with IV fluid administration,calcitonin , follow-up with oncologist as outpatient for further medical therapy if needed 2. Acute renal failure due to hypercalcemia, improved with IV fluid administration, follow in the morning, urinalysis was unremarkable 3. Metastatic carcinoma, oncologist is consulted, percutaneous sampling of supraclavicular node for tissue diagnosis was performed first of November 2017, pathology results are pending. Palliative care consulted, patient is hoping to initiate chemotherapy, if needed 4. Constipation, continue patient on Colace, Senokot, MiraLAX 5. Essential hypertension, continue Norvasc, HCTZ discontinued due to hypercalcemia   DISCHARGE CONDITIONS:   Stable  CONSULTS OBTAINED:  Treatment Team:  Lequita Asal, MD  DRUG ALLERGIES:   Allergies  Allergen Reactions  . Codeine Nausea And Vomiting  . Penicillins Hives and Other (See Comments)    Has patient had a PCN reaction causing immediate rash, facial/tongue/throat swelling, SOB or lightheadedness with hypotension: no Has patient had a PCN reaction causing severe rash involving mucus membranes or skin necrosis: no Has patient had a PCN reaction that required hospitalization no Has patient had a PCN reaction occurring within the last 10 years: no If all of the above answers are "NO", then may proceed with Cephalosporin use.     DISCHARGE MEDICATIONS:   Discharge Medication List as of 03/12/2016 10:16 AM  START taking these medications   Details  calcitonin, salmon, (MIACALCIN/FORTICAL) 200 UNIT/ACT nasal spray Place 1 spray into alternate nostrils daily., Starting Thu 03/12/2016, Normal    polyethylene glycol (MIRALAX / GLYCOLAX) packet  Take 17 g by mouth daily., Starting Thu 03/12/2016, Normal    senna (SENOKOT) 8.6 MG TABS tablet Take 1 tablet (8.6 mg total) by mouth daily., Starting Thu 03/12/2016, Normal      CONTINUE these medications which have CHANGED   Details  HYDROcodone-acetaminophen (NORCO) 7.5-325 MG tablet Take 1-2 tablets by mouth every 4 (four) hours as needed for moderate pain., Starting Wed 03/11/2016, Print      CONTINUE these medications which have NOT CHANGED   Details  amLODipine (NORVASC) 10 MG tablet Take 10 mg by mouth at bedtime., Until Discontinued, Historical Med    docusate sodium (COLACE) 100 MG capsule Take 200 mg by mouth at bedtime., Until Discontinued, Historical Med    LORazepam (ATIVAN) 0.5 MG tablet Take 0.5 mg by mouth every 8 (eight) hours as needed for anxiety. , Starting 07/03/2015, Until Discontinued, Historical Med    Melatonin 5 MG CAPS Take 5 mg by mouth at bedtime as needed (For sleep.). , Until Discontinued, Historical Med    methocarbamol (ROBAXIN) 500 MG tablet Take 1 tablet (500 mg total) by mouth every 6 (six) hours as needed for muscle spasms., Starting Wed 10/23/2015, Print    metoprolol (TOPROL-XL) 200 MG 24 hr tablet Take 100 mg by mouth at bedtime. , Until Discontinued, Historical Med    Multiple Vitamin (MULTIVITAMIN WITH MINERALS) TABS tablet Take 1 tablet by mouth daily., Until Discontinued, Historical Med    OMEGA-3 FATTY ACIDS PO Take 2 capsules by mouth daily. , Historical Med    traZODone (DESYREL) 50 MG tablet Take 50 mg by mouth at bedtime as needed for sleep. , Until Discontinued, Historical Med      STOP taking these medications     hydrochlorothiazide (HYDRODIURIL) 25 MG tablet      meloxicam (MOBIC) 15 MG tablet      sulfamethoxazole-trimethoprim (BACTRIM DS,SEPTRA DS) 800-160 MG tablet      omeprazole (PRILOSEC) 20 MG capsule          DISCHARGE INSTRUCTIONS:    The patient is to follow-up with primary care physician, primary oncologist  as outpatient  If you experience worsening of your admission symptoms, develop shortness of breath, life threatening emergency, suicidal or homicidal thoughts you must seek medical attention immediately by calling 911 or calling your MD immediately  if symptoms less severe.  You Must read complete instructions/literature along with all the possible adverse reactions/side effects for all the Medicines you take and that have been prescribed to you. Take any new Medicines after you have completely understood and accept all the possible adverse reactions/side effects.   Please note  You were cared for by a hospitalist during your hospital stay. If you have any questions about your discharge medications or the care you received while you were in the hospital after you are discharged, you can call the unit and asked to speak with the hospitalist on call if the hospitalist that took care of you is not available. Once you are discharged, your primary care physician will handle any further medical issues. Please note that NO REFILLS for any discharge medications will be authorized once you are discharged, as it is imperative that you return to your primary care physician (or establish a relationship with a primary care physician if you do  not have one) for your aftercare needs so that they can reassess your need for medications and monitor your lab values.    Today   CHIEF COMPLAINT:  No chief complaint on file.   HISTORY OF PRESENT ILLNESS:  Shawndell Salib  is a 65 y.o. female with a known history of renal cell carcinoma status post left nephrectomy in 2007 who presents to the hospital after she was found to have hypercalcemia and acute renal failure by oncologist who she saw for a small upper abdominal adenopathy and bibasilar lung nodules. Patient reported 30 pound weight loss and drenching night sweats. Her labs revealed acute renal failure and hypercalcemia. Initial creatinine was 2.07, estimated GFR  of 24, however with rehydration and calcitonin her creatinine has improved to 1.36 on the day of discharge, with estimated GFR of 40. The patient was seen by Dr. Mike Gip who recommended to get ultrasound guided FNA and subsequent core biopsy of right subclavicular node, which was performed on 03/11/2016. Postprocedure, patient did well, denied any significant pain and was ready to be discharged home for oncologist. Follow-up as outpatient Discussion by problem: 1. Hypercalcemia , worrisome for malignancy, PTH related peptide is pending, improved with IV fluid administration,calcitonin , follow-up with oncologist as outpatient for further medical therapy if needed 2. Acute renal failure due to hypercalcemia, improved with IV fluid administration, follow in the morning, urinalysis was unremarkable 3. Metastatic carcinoma, oncologist is consulted, percutaneous sampling of supraclavicular node for tissue diagnosis was performed first of November 2017, pathology results are pending. Palliative care consulted, patient is hoping to initiate chemotherapy, if needed 4. Constipation, continue patient on Colace, Senokot, MiraLAX 5. Essential hypertension, continue Norvasc, HCTZ discontinued due to hypercalcemia    VITAL SIGNS:  Blood pressure (!) 158/89, pulse 69, temperature 98 F (36.7 C), temperature source Oral, resp. rate 18, height 5\' 4"  (1.626 m), weight 93.1 kg (205 lb 3 oz), SpO2 99 %.  I/O:   Intake/Output Summary (Last 24 hours) at 03/12/16 1349 Last data filed at 03/12/16 0800  Gross per 24 hour  Intake              480 ml  Output             2750 ml  Net            -2270 ml    PHYSICAL EXAMINATION:  GENERAL:  65 y.o.-year-old patient lying in the bed with no acute distress.  EYES: Pupils equal, round, reactive to light and accommodation. No scleral icterus. Extraocular muscles intact.  HEENT: Head atraumatic, normocephalic. Oropharynx and nasopharynx clear.  NECK:  Supple, no jugular  venous distention. No thyroid enlargement, no tenderness.  LUNGS: Normal breath sounds bilaterally, no wheezing, rales,rhonchi or crepitation. No use of accessory muscles of respiration.  CARDIOVASCULAR: S1, S2 normal. No murmurs, rubs, or gallops.  ABDOMEN: Soft, non-tender, non-distended. Bowel sounds present. No organomegaly or mass.  EXTREMITIES: No pedal edema, cyanosis, or clubbing.  NEUROLOGIC: Cranial nerves II through XII are intact. Muscle strength 5/5 in all extremities. Sensation intact. Gait not checked.  PSYCHIATRIC: The patient is alert and oriented x 3.  SKIN: No obvious rash, lesion, or ulcer.   DATA REVIEW:   CBC  Recent Labs Lab 03/12/16 0433  WBC 3.6  HGB 12.8  HCT 37.3  PLT 134*    Chemistries   Recent Labs Lab 03/09/16 1035  03/12/16 0433  NA 137  < > 139  K 3.9  < > 3.4*  CL 98*  < > 106  CO2 28  < > 27  GLUCOSE 114*  < > 124*  BUN 31*  < > 20  CREATININE 2.07*  < > 1.36*  CALCIUM 13.1*  < > 11.7*  AST 43*  --   --   ALT 51  --   --   ALKPHOS 105  --   --   BILITOT 0.9  --   --   < > = values in this interval not displayed.  Cardiac Enzymes No results for input(s): TROPONINI in the last 168 hours.  Microbiology Results  Results for orders placed or performed during the hospital encounter of 10/16/15  Surgical pcr screen     Status: None   Collection Time: 10/16/15 10:29 AM  Result Value Ref Range Status   MRSA, PCR NEGATIVE NEGATIVE Final   Staphylococcus aureus NEGATIVE NEGATIVE Final    Comment:        The Xpert SA Assay (FDA approved for NASAL specimens in patients over 20 years of age), is one component of a comprehensive surveillance program.  Test performance has been validated by [facility] for patients greater than or equal to 3 year old. It is not intended to diagnose infection nor to guide or monitor treatment.     RADIOLOGY:  Nm Bone Scan Whole Body  Result Date: 03/10/2016 CLINICAL DATA:  History of  hypercalcemia, lung nodules, previous left ankle wall and bilateral knee replacement and right toe surgery. EXAM: NUCLEAR MEDICINE WHOLE BODY BONE SCAN TECHNIQUE: Whole body anterior and posterior images were obtained approximately 3 hours after intravenous injection of radiopharmaceutical. RADIOPHARMACEUTICALS:  21.417 mCi Technetium-48m MDP IV COMPARISON:  None in PACs FINDINGS: There is adequate uptake of the radiopharmaceutical by the skeleton. There is adequate soft tissue clearance. The left kidney is surgically absent for malignancy. There is mild compensatory hypertrophy of the right kidney. Uptake of the radiopharmaceutical within the calvarium and cervical and thoracic spine is normal. No abnormal rib uptake is observed. Uptake within the visualized portions of the upper extremities is normal. There is mildly increased uptake at the L5-S1 level. Uptake within the pelvis is normal. There is mildly increased uptake about both prosthetic knee joints, both ankles, and in the right midfoot and right first MTP joint. IMPRESSION: No findings to suggest primary or metastatic malignancy within the bones. Uptake in the lower lumbar spine, knees, ankles, and right foot are most compatible with degenerative and postsurgical changes. Electronically Signed   By: David  Martinique M.D.   On: 03/10/2016 14:29   US Biopsy  Result Date: 03/11/2016 INDICATION: Right supraclavicular lymphadenopathy EXAM: Ultrasound-guided fine-needle aspiration and subsequent core biopsy of right supraclavicular lymphadenopathy MEDICATIONS: None. ANESTHESIA/SEDATION: None FLUOROSCOPY TIME:  Not applicable COMPLICATIONS: None immediate. PROCEDURE: Informed written consent was obtained from the patient after a thorough discussion of the procedural risks, benefits and alternatives. All questions were addressed. Maximal Sterile Barrier Technique was utilized including caps, mask, sterile gowns, sterile gloves, sterile drape, hand hygiene and skin  antiseptic. A timeout was performed prior to the initiation of the procedure. Utilizing 1% xylocaine as local anesthetic and real-time ultrasound guidance, 4 passes were performed utilizing 25 gauge spinal needles for fine-needle aspiration. Hard copy imaging of the guidance was performed. The samples were deemed insufficient by the cytotechnologist and for this reason a 11 gauge guiding needle was placed under real-time ultrasound guidance into the right supraclavicular lymph node. Two 18 gauge core biopsies were then obtained and utilized  for both touch prep and surgical pathology. The touch preps yielded a significantly greater degree of diagnostic tissue. Study was then terminated and the guiding needle removed. Hemostasis was obtained at the puncture site. The patient tolerated the procedure well and was returned her room in satisfactory condition. IMPRESSION: Initial fine-needle aspiration which was felt to be inadequate leading to subsequent diagnostic core biopsy of right supraclavicular lymphadenopathy. Electronically Signed   By: Inez Catalina M.D.   On: 03/11/2016 09:40    EKG:   Orders placed or performed during the hospital encounter of 02/12/15  . EKG 12-Lead  . EKG 12-Lead      Management plans discussed with the patient, family and they are in agreement.  CODE STATUS:     Code Status Orders        Start     Ordered   03/09/16 1836  Full code  Continuous     03/09/16 1836    Code Status History    Date Active Date Inactive Code Status Order ID Comments User Context   This patient has a current code status but no historical code status.      TOTAL TIME TAKING CARE OF THIS PATIENT: 40 minutes.    Theodoro Grist M.D on 03/12/2016 at 1:49 PM  Between 7am to 6pm - Pager - [phone]  After 6pm go to [email or web address] - password [email or web address] [facility]  Office  [phone]  CC: Primary care physician; Madelyn Brunner, MD

## 2016-03-12 NOTE — Progress Notes (Signed)
[place] [facility] [place] NAME: Jill Bond    MR#:  [id number]  DATE OF BIRTH:  05-28-1950  SUBJECTIVE:  CHIEF COMPLAINT:  No chief complaint on file. Patient is 65 year old Caucasian female with past medical history significant for history of renal cell carcinoma status post left nephrectomy in 2007 who presents to the hospital after she was found to have hypercalcemia and acute renal failure by oncologist who she saw for a small upper abdominal adenopathy and bibasilar lung nodules. Patient reported 30 pound weight loss and drenching night sweats. Her labs revealed acute renal failure and hypercalcemia. Her baseline creatinine was 0.9-1.1, however, was 1.3 with GFR of 41 in October 2017. Vision had ultrasound-guided FNA and the supraclavicular node biopsy today, she denies any significant pain. She is expecting to go home today. Creatinine has improved to 1.56, estimated GFR of 34, patient is continued on IV fluids. Calcium did not improve significantly, to 11.9, since calcitonin was not initiated until today in the morning  Review of Systems  Constitutional: Positive for diaphoresis and weight loss. Negative for chills and fever.  HENT: Negative for congestion.   Eyes: Negative for blurred vision and double vision.  Respiratory: Negative for cough, sputum production, shortness of breath and wheezing.   Cardiovascular: Negative for chest pain, palpitations, orthopnea, leg swelling and PND.  Gastrointestinal: Positive for constipation. Negative for abdominal pain, blood in stool, diarrhea, nausea and vomiting.  Genitourinary: Negative for dysuria, frequency, hematuria and urgency.  Musculoskeletal: Negative for falls.  Neurological: Negative for dizziness, tremors, focal weakness and headaches.  Endo/Heme/Allergies: Does not bruise/bleed easily.  Psychiatric/Behavioral: Negative for depression. The patient does not have insomnia.     VITAL  SIGNS: Blood pressure (!) 158/89, pulse 69, temperature 98 F (36.7 C), temperature source Oral, resp. rate 18, height 5\' 4"  (1.626 m), weight 93.1 kg (205 lb 3 oz), SpO2 99 %.  PHYSICAL EXAMINATION:   GENERAL:  65 y.o.-year-old patient lying in the bed with no acute distress. Dry mucosa.  EYES: Pupils equal, round, reactive to light and accommodation. No scleral icterus. Extraocular muscles intact.  HEENT: Head atraumatic, normocephalic. Oropharynx and nasopharynx clear.  NECK:  Supple, no jugular venous distention. No thyroid enlargement, no tenderness.  LUNGS: Normal breath sounds bilaterally, no wheezing, rales,rhonchi or crepitation. No use of accessory muscles of respiration.  CARDIOVASCULAR: S1, S2 normal. No murmurs, rubs, or gallops.  ABDOMEN: Soft, nontender, nondistended. Bowel sounds present. No organomegaly or mass.  EXTREMITIES: No pedal edema, cyanosis, or clubbing.  NEUROLOGIC: Cranial nerves II through XII are intact. Muscle strength 5/5 in all extremities. Sensation intact. Gait not checked.  PSYCHIATRIC: The patient is alert and oriented x 3.  SKIN: No obvious rash, lesion, or ulcer.   ORDERS/RESULTS REVIEWED:   CBC  Recent Labs Lab 03/09/16 1035 03/12/16 0433  WBC 5.2 3.6  HGB 13.5 12.8  HCT 40.0 37.3  PLT 166 134*  MCV 87.6 88.3  MCH 29.6 30.3  MCHC 33.8 34.3  RDW 15.0* 14.3  LYMPHSABS 0.8*  --   MONOABS 0.7  --   EOSABS 0.3  --   BASOSABS 0.0  --    ------------------------------------------------------------------------------------------------------------------  Chemistries   Recent Labs Lab 03/09/16 1035 03/10/16 0538 03/11/16 0332 03/12/16 0433  NA 137 138 140 139  K 3.9 3.5 3.6 3.4*  CL 98* 106 104 106  CO2 28 29 31 27   GLUCOSE 114* 129* 99 124*  BUN 31* 24* 23* 20  CREATININE 2.07* 1.83* 1.58* 1.36*  CALCIUM 13.1* 12.4* 11.9* 11.7*  AST 43*  --   --   --   ALT 51  --   --   --   ALKPHOS 105  --   --   --   BILITOT 0.9  --   --   --     ------------------------------------------------------------------------------------------------------------------ estimated creatinine clearance is 46.2 mL/min (by C-G formula based on SCr of 1.36 mg/dL (H)). ------------------------------------------------------------------------------------------------------------------ No results for input(s): TSH, T4TOTAL, T3FREE, THYROIDAB in the last 72 hours.  Invalid input(s): FREET3  Cardiac Enzymes No results for input(s): CKMB, TROPONINI, MYOGLOBIN in the last 168 hours.  Invalid input(s): CK ------------------------------------------------------------------------------------------------------------------ Invalid input(s): POCBNP ---------------------------------------------------------------------------------------------------------------  RADIOLOGY: Nm Bone Scan Whole Body  Result Date: 03/10/2016 CLINICAL DATA:  History of hypercalcemia, lung nodules, previous left ankle wall and bilateral knee replacement and right toe surgery. EXAM: NUCLEAR MEDICINE WHOLE BODY BONE SCAN TECHNIQUE: Whole body anterior and posterior images were obtained approximately 3 hours after intravenous injection of radiopharmaceutical. RADIOPHARMACEUTICALS:  21.417 mCi Technetium-55m MDP IV COMPARISON:  None in PACs FINDINGS: There is adequate uptake of the radiopharmaceutical by the skeleton. There is adequate soft tissue clearance. The left kidney is surgically absent for malignancy. There is mild compensatory hypertrophy of the right kidney. Uptake of the radiopharmaceutical within the calvarium and cervical and thoracic spine is normal. No abnormal rib uptake is observed. Uptake within the visualized portions of the upper extremities is normal. There is mildly increased uptake at the L5-S1 level. Uptake within the pelvis is normal. There is mildly increased uptake about both prosthetic knee joints, both ankles, and in the right midfoot and right first MTP joint.  IMPRESSION: No findings to suggest primary or metastatic malignancy within the bones. Uptake in the lower lumbar spine, knees, ankles, and right foot are most compatible with degenerative and postsurgical changes. Electronically Signed   By: David  Martinique M.D.   On: 03/10/2016 14:29   US Biopsy  Result Date: 03/11/2016 INDICATION: Right supraclavicular lymphadenopathy EXAM: Ultrasound-guided fine-needle aspiration and subsequent core biopsy of right supraclavicular lymphadenopathy MEDICATIONS: None. ANESTHESIA/SEDATION: None FLUOROSCOPY TIME:  Not applicable COMPLICATIONS: None immediate. PROCEDURE: Informed written consent was obtained from the patient after a thorough discussion of the procedural risks, benefits and alternatives. All questions were addressed. Maximal Sterile Barrier Technique was utilized including caps, mask, sterile gowns, sterile gloves, sterile drape, hand hygiene and skin antiseptic. A timeout was performed prior to the initiation of the procedure. Utilizing 1% xylocaine as local anesthetic and real-time ultrasound guidance, 4 passes were performed utilizing 25 gauge spinal needles for fine-needle aspiration. Hard copy imaging of the guidance was performed. The samples were deemed insufficient by the cytotechnologist and for this reason a 21 gauge guiding needle was placed under real-time ultrasound guidance into the right supraclavicular lymph node. Two 18 gauge core biopsies were then obtained and utilized for both touch prep and surgical pathology. The touch preps yielded a significantly greater degree of diagnostic tissue. Study was then terminated and the guiding needle removed. Hemostasis was obtained at the puncture site. The patient tolerated the procedure well and was returned her room in satisfactory condition. IMPRESSION: Initial fine-needle aspiration which was felt to be inadequate leading to subsequent diagnostic core biopsy of right supraclavicular lymphadenopathy.  Electronically Signed   By: Inez Catalina M.D.   On: 03/11/2016 09:40    EKG:  Orders placed or performed during the hospital encounter of 02/12/15  . EKG 12-Lead  .  EKG 12-Lead    ASSESSMENT AND PLAN:  Principal Problem:   Hypercalcemia Active Problems:   Acute renal failure (HCC)   Metastatic carcinoma (HCC)   S/P lymph node biopsy   Constipation   Palliative care encounter   Goals of care, counseling/discussion 1. Hypercalcemia of likely malignancy, improved with IV fluids , calcitonin, discussed with Dr. Mike Gip, who is going to call me if preliminary pathology results are known .  2. Acute renal failure due to hypercalcemia, improving with IV fluid administration, follow in the morning, get urinalysis 3. Metastatic carcinomatosis, oncologistconsultation is appreciated, patient, the patient underwent a right supraclavicular node biopsy today for tissue diagnosis. Palliative care consulted, Input is appreciated  4. Constipation, continue patient on stool softeners and stimulants now complains  5. Essential hypertension, continue Norvasc    Management plans discussed with the patient, family and they are in agreement.   DRUG ALLERGIES:  Allergies  Allergen Reactions  . Codeine Nausea And Vomiting  . Penicillins Hives and Other (See Comments)    Has patient had a PCN reaction causing immediate rash, facial/tongue/throat swelling, SOB or lightheadedness with hypotension: no Has patient had a PCN reaction causing severe rash involving mucus membranes or skin necrosis: no Has patient had a PCN reaction that required hospitalization no Has patient had a PCN reaction occurring within the last 10 years: no If all of the above answers are "NO", then may proceed with Cephalosporin use.     CODE STATUS:     Code Status Orders        Start     Ordered   03/09/16 1836  Full code  Continuous     03/09/16 1836    Code Status History    Date Active Date Inactive Code Status  Order ID Comments User Context   This patient has a current code status but no historical code status.      TOTAL TIME TAKING CARE OF THIS PATIENT: 30 minutes.    Theodoro Grist M.D on 03/12/2016 at 1:57 PM  Between 7am to 6pm - Pager - [phone]  After 6pm go to [email or web address] - password [email or web address] [facility]  Office  [phone]  CC: Primary care physician; Madelyn Brunner, MD

## 2016-03-12 NOTE — Progress Notes (Signed)
Discharge paperwork reviewed with patient. Prescriptions sent electronically to [facility] except [facility] paper prescription attached to paperwork. Patient verbalized understanding of discharge instructions, medication changes, and follow-up appointments. Patient's family to transport home.

## 2016-03-12 NOTE — Progress Notes (Signed)
Daily Progress Note   Patient Name: Jill Bond       Date: 03/12/2016 DOB: 1950/07/18  Age: 65 y.o. MRN#: [id number] Attending Physician: Theodoro Grist, MD Primary Care Physician: Madelyn Brunner, MD Admit Date: 03/09/2016  Reason for Consultation/Follow-up: Establishing goals of care  Subjective: Patient awake, alert, and sitting up in chair this AM. She tells me she is going to be discharged in the next few hours. Daughter and grandson at bedside. Patient and family still waiting on pending test results. Patient tells me she will most likely have to have chemo again and is willing to take chemo. Hopeful that she will feel well enough to go to [employer] for Christmas with her family. Hard choices copy given. Encouraged patient and daughter to complete advanced directive packet. Answered questions and offered emotional support.   Length of Stay: 3  Current Medications: Scheduled Meds:  . amLODipine  10 mg Oral QHS  . calcitonin (salmon)  1 spray Alternating Nares Daily  . docusate sodium  200 mg Oral QHS  . heparin  5,000 Units Subcutaneous Q8H  . metoprolol  100 mg Oral QHS  . pantoprazole  40 mg Oral Daily  . polyethylene glycol  17 g Oral Daily  . senna  1 tablet Oral Daily    Continuous Infusions: . sodium chloride Stopped (03/12/16 0957)    PRN Meds: acetaminophen **OR** acetaminophen, albuterol, LORazepam, Melatonin, methocarbamol, ondansetron **OR** ondansetron (ZOFRAN) IV, traZODone  Physical Exam          Vital Signs: BP (!) 158/89 (BP Location: Left Arm)   Pulse 69   Temp 98 F (36.7 C) (Oral)   Resp 18   Ht 5\' 4"  (1.626 m)   Wt 93.1 kg (205 lb 3 oz)   SpO2 99%   BMI 35.22 kg/m  SpO2: SpO2: 99 % O2 Device: O2 Device: Not Delivered O2 Flow Rate:     Intake/output summary:  Intake/Output Summary (Last 24 hours) at 03/12/16 1042 Last data filed at 03/12/16 0800  Gross per 24 hour  Intake              720 ml  Output             2750 ml  Net            -  2030 ml   LBM: Last BM Date: 03/11/16 Baseline Weight: Weight: 93.2 kg (205 lb 7.5 oz) Most recent weight: Weight: 93.1 kg (205 lb 3 oz)       Palliative Assessment/Data: PPS 70%   Flowsheet Rows   Flowsheet Row Most Recent Value  Intake Tab  Referral Department  Hospitalist  Unit at Time of Referral  Oncology Unit  Palliative Care Primary Diagnosis  Cancer  Palliative Care Type  New Palliative care  Reason for referral  Clarify Goals of Care  Clinical Assessment  Palliative Performance Scale Score  70%  Psychosocial & Spiritual Assessment  Palliative Care Outcomes  Patient/Family meeting held?  Yes  Who was at the meeting?  patient and relatives  Palliative Care Outcomes  Clarified goals of care, Provided psychosocial or spiritual support, Provided advance care planning      Patient Active Problem List   Diagnosis Date Noted  . Palliative care encounter   . Goals of care, counseling/discussion   . Acute renal failure ([place]) 03/11/2016  . Metastatic carcinoma ([place]) 03/11/2016  . Constipation 03/11/2016  . S/P lymph node biopsy 03/11/2016  . Lung nodules 03/09/2016  . Adenopathy 03/09/2016  . Weight loss 03/09/2016  . Hypercalcemia 03/09/2016  . Obese 10/23/2015  . S/P right TKA 10/22/2015    Palliative Care Assessment & Plan   Patient Profile: 65 y.o. female  with past medical history of renal carcinoma s/p left nephrectomy, chronic kidney disease, hypertension, depression, constipation, arthritis, and anxiety admitted on 03/09/2016 with poor oral intake, nausea, and vomiting. She has lost 30 pounds in 6 weeks. On 02/26/16 she had a CT scan of abdomen/pelvis revealing multiple new basilar lung nodules. Dr. Mike Gip sent patient to the hospital after labs  resulted (creatinine of 2.07 (CrCl 24 ml/min), calcium 13.1, and uric acid 9.1) with worsening renal function and hypercalcemia. Chest CT on 03/10/16 revealing numerous bilateral pulmonary nodules measuring up to 16mm suspicious for metastatic disease. Also thoracic lymphadenopathy. Per Dr. Mike Gip, differentials include nodal metastasis versus primary bronchogenic neoplasm. Bone scan on 10/31 negative. Pending supraclavicular lymph node biopsy. Palliative medicine consultation for goals of care.   Assessment: Hypercalcemia  Acute renal failure Metastatic carcinoma with pending lymph node biopsy Weight loss  Recommendations/Plan:  Hard Choices copy given. Encouraged continued discussions with daughter/family regarding advanced directives.   Patient willing to start chemotherapy if necessary.   May benefit from palliative services outpatient.   Goals of Care and Additional Recommendations:  Limitations on Scope of Treatment: Full Scope Treatment  Code Status: FULL   Code Status Orders        Start     Ordered   03/09/16 1836  Full code  Continuous     03/09/16 1836    Code Status History    Date Active Date Inactive Code Status Order ID Comments User Context   This patient has a current code status but no historical code status.       Prognosis:   Unable to determine  Discharge Planning:  Home with Palliative Services  Care plan was discussed with patient, daughter, RN, and Dr. Ether Griffins  Thank you for allowing the Palliative Medicine Team to assist in the care of this patient.   Time In: 0900 Time Out: 940 Total Time 26min Prolonged Time Billed  no       Greater than 50%  of this time was spent counseling and coordinating care related to the above assessment and plan.  Ihor Dow, FNP-C Palliative  Medicine Team  Phone: [phone] Fax: [phone]  Please contact Palliative Medicine Team phone at [phone] for questions and concerns.

## 2016-03-13 ENCOUNTER — Telehealth: Payer: Self-pay | Admitting: *Deleted

## 2016-03-13 ENCOUNTER — Other Ambulatory Visit: Payer: Self-pay | Admitting: Internal Medicine

## 2016-03-13 DIAGNOSIS — R110 Nausea: Secondary | ICD-10-CM

## 2016-03-13 LAB — PTH-RELATED PEPTIDE: PTH-related peptide: <1.1 pmol/L

## 2016-03-13 LAB — CYTOLOGY - NON PAP

## 2016-03-13 LAB — SURGICAL PATHOLOGY

## 2016-03-13 MED ORDER — PROCHLORPERAZINE MALEATE 10 MG PO TABS: 10.0000 mg | ORAL_TABLET | Freq: Four times a day (QID) | ORAL | 0 refills | Status: DC | PRN

## 2016-03-13 NOTE — Telephone Encounter (Signed)
Called script for compazine, called daughter and I gave her instructions.

## 2016-03-13 NOTE — Telephone Encounter (Signed)
Requesting medicine for nausea. Please advise

## 2016-03-13 NOTE — Telephone Encounter (Signed)
Daughter called and wanted results of her mom's bx. I asked if they have an appointment for f/u visit. I looked at results and it was negative for malignancy and daughter asked  If they should come to the f/u appt and I told them yes. They will make the appt next week

## 2016-03-16 ENCOUNTER — Encounter: Payer: Self-pay | Admitting: Diagnostic Radiology

## 2016-03-18 ENCOUNTER — Other Ambulatory Visit: Payer: Self-pay | Admitting: Pathology

## 2016-03-19 ENCOUNTER — Ambulatory Visit: Payer: Commercial Managed Care - HMO

## 2016-03-19 ENCOUNTER — Other Ambulatory Visit: Payer: Self-pay | Admitting: Hematology and Oncology

## 2016-03-19 DIAGNOSIS — R599 Enlarged lymph nodes, unspecified: Secondary | ICD-10-CM

## 2016-03-19 DIAGNOSIS — R591 Generalized enlarged lymph nodes: Secondary | ICD-10-CM

## 2016-03-20 ENCOUNTER — Telehealth: Payer: Self-pay | Admitting: *Deleted

## 2016-03-20 ENCOUNTER — Inpatient Hospital Stay: Payer: Commercial Managed Care - HMO

## 2016-03-20 ENCOUNTER — Encounter: Payer: Self-pay | Admitting: Hematology and Oncology

## 2016-03-20 ENCOUNTER — Other Ambulatory Visit: Payer: Self-pay | Admitting: *Deleted

## 2016-03-20 ENCOUNTER — Inpatient Hospital Stay: Payer: Commercial Managed Care - HMO | Attending: Hematology and Oncology | Admitting: Hematology and Oncology

## 2016-03-20 VITALS — BP 145/80 | HR 76 | Temp 96.8°F | Resp 18 | Wt 205.0 lb

## 2016-03-20 DIAGNOSIS — Z79899 Other long term (current) drug therapy: Secondary | ICD-10-CM | POA: Insufficient documentation

## 2016-03-20 DIAGNOSIS — R5383 Other fatigue: Secondary | ICD-10-CM | POA: Insufficient documentation

## 2016-03-20 DIAGNOSIS — R61 Generalized hyperhidrosis: Secondary | ICD-10-CM | POA: Diagnosis not present

## 2016-03-20 DIAGNOSIS — R51 Headache: Secondary | ICD-10-CM | POA: Diagnosis not present

## 2016-03-20 DIAGNOSIS — R918 Other nonspecific abnormal finding of lung field: Secondary | ICD-10-CM | POA: Diagnosis not present

## 2016-03-20 DIAGNOSIS — N189 Chronic kidney disease, unspecified: Secondary | ICD-10-CM | POA: Diagnosis not present

## 2016-03-20 DIAGNOSIS — I129 Hypertensive chronic kidney disease with stage 1 through stage 4 chronic kidney disease, or unspecified chronic kidney disease: Secondary | ICD-10-CM | POA: Insufficient documentation

## 2016-03-20 DIAGNOSIS — Z905 Acquired absence of kidney: Secondary | ICD-10-CM | POA: Diagnosis not present

## 2016-03-20 DIAGNOSIS — L928 Other granulomatous disorders of the skin and subcutaneous tissue: Secondary | ICD-10-CM | POA: Diagnosis not present

## 2016-03-20 DIAGNOSIS — Z880 Allergy status to penicillin: Secondary | ICD-10-CM | POA: Diagnosis not present

## 2016-03-20 DIAGNOSIS — K219 Gastro-esophageal reflux disease without esophagitis: Secondary | ICD-10-CM | POA: Insufficient documentation

## 2016-03-20 DIAGNOSIS — Z85528 Personal history of other malignant neoplasm of kidney: Secondary | ICD-10-CM | POA: Diagnosis not present

## 2016-03-20 DIAGNOSIS — R634 Abnormal weight loss: Secondary | ICD-10-CM | POA: Diagnosis not present

## 2016-03-20 DIAGNOSIS — R599 Enlarged lymph nodes, unspecified: Secondary | ICD-10-CM

## 2016-03-20 DIAGNOSIS — L929 Granulomatous disorder of the skin and subcutaneous tissue, unspecified: Secondary | ICD-10-CM

## 2016-03-20 DIAGNOSIS — M1990 Unspecified osteoarthritis, unspecified site: Secondary | ICD-10-CM | POA: Insufficient documentation

## 2016-03-20 DIAGNOSIS — R590 Localized enlarged lymph nodes: Secondary | ICD-10-CM | POA: Insufficient documentation

## 2016-03-20 DIAGNOSIS — R0602 Shortness of breath: Secondary | ICD-10-CM | POA: Insufficient documentation

## 2016-03-20 DIAGNOSIS — R591 Generalized enlarged lymph nodes: Secondary | ICD-10-CM

## 2016-03-20 NOTE — Telephone Encounter (Signed)
Called to let daughter know that pt appt with pulmonology is 11/15 at 10:45 and gave her instructions on how to get there and we had told them while they were in the office earlier today also. She is agreeable and will take her mom there

## 2016-03-20 NOTE — Progress Notes (Signed)
[facility] day:  03/20/2016  Chief Complaint: Jill Bond is a 65 y.o. female with adenopathy who is seen for reassessment after interval hospitalization and biopsy.  HPI: The patient was last seen in the medical oncology clinic on 03/09/2016 for initial consultation.  She was noted to have small upper abdominal adenopathy and bibasilar lung nodes.  She had a 30 pound weight loss and some drenching night sweats suggestive of lymphoma.  She had a history of "renal cell carcinoma" s/p left nephrectomy on 03/15/2006.  Pathology was unavailable at last visit.  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  Baseline creatinine was 0.9 - 1.1 (CrCl 50-63 ml/min).  Labs revealed a creatinine of 2.07 (CrCl 24 ml/min), calcium 13.1, and uric acid 9.1.  PTH-related polypeptide was < 1.1 (normal).  The patient was admitted secondary to acute renal insufficiency and hypercalcemia.  Her work-up was expedited.   She was admitted to [facility] from 03/09/2016 - 03/12/2016.  Chest CT on 03/10/2016 revealed numerous bilateral pulmonary nodules, measuring up to 9 mm in the central left lower lobe. Bone scan on 03/10/2016 revealed no findings to suggest primary or metastatic malignancy within the bones. Uptake in the lower lumbar spine, knees, ankles, and right foot were most compatible with degenerative and postsurgical changes.  Thoracic lymphadenopathy, including a dominant 12 mm short axis node at the left thoracic inlet and a 15 mm short axis right hilar node.  She underwent ultrasound guided supraclavicular lymph node biopsy on 03/11/2016.  Pathology revealed non-necrotizing granulomatous inflammation with fibrosis.  There was no evidence of malignancy.  GMS-fungal, AFB and Gram stains were negative.  Pathology from the left nephrectomy specimen showed multiple angiomyolipomas without evidence of renal cell carcinoma  ([id number]).  Symptomatically, she feels alright.  She states that her back hurts.     Past Medical History:  Diagnosis Date  . Anxiety   . Arthritis   . Cancer ([place])    kidney LEFT  . Chronic kidney disease 2006   Left Nephrectomy  . Complication of anesthesia    PONV  . Constipation   . Depression   . GERD (gastroesophageal reflux disease)   . Headache   . Hypertension   . PONV (postoperative nausea and vomiting)   . Rotator cuff disorder    LEFT  . Shortness of breath dyspnea    with exertion    Past Surgical History:  Procedure Laterality Date  . ABDOMINAL HYSTERECTOMY     PARTIAL  . ACHILLES TENDON SURGERY Left 02/22/2015   Procedure: Secondary ACHILLES TENDON REPAIR;  Surgeon: Albertine Patricia, DPM;  Location: [facility] ORS;  Service: Podiatry;  Laterality: Left;  . Bladder tac    . BREAST BIOPSY Left 2003   neg  . COLONOSCOPY  2013  . JOINT REPLACEMENT Left 2007   Total Knee Replacement  . OSTECTOMY Left 02/22/2015   Procedure: OSTECTOMY;  Surgeon: Albertine Patricia, DPM;  Location: [facility] ORS;  Service: Podiatry;  Laterality: Left;  . SHOULDER ARTHROSCOPY W/ ROTATOR CUFF REPAIR Left YRS AGO  . TONSILLECTOMY  AGE 54  . TOTAL KNEE ARTHROPLASTY Right 10/22/2015   Procedure: RIGHT TOTAL KNEE ARTHROPLASTY;  Surgeon: Paralee Cancel, MD;  Location: [facility] ORS;  Service: Orthopedics;  Laterality: Right;  . TOTAL KNEE ARTHROPLASTY Right    10/22/2015    Family History  Problem Relation Age of Onset  . Breast cancer Mother 9  . Heart failure Father   . Prostate  cancer Father     Social History:  reports that she has never smoked. She has never used smokeless tobacco. She reports that she does not drink alcohol or use drugs.  She denies any exposure to radiation or toxins.  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  Her husband died on 09/27/13.  Her daughter pat is her medical power of attorney.  The patient is accompanied by her daughter, [name], and her  first cousin, Tonette Bihari, today.  Allergies:  Allergies  Allergen Reactions  . Codeine Nausea And Vomiting  . Penicillins Hives and Other (See Comments)    Has patient had a PCN reaction causing immediate rash, facial/tongue/throat swelling, SOB or lightheadedness with hypotension: no Has patient had a PCN reaction causing severe rash involving mucus membranes or skin necrosis: no Has patient had a PCN reaction that required hospitalization no Has patient had a PCN reaction occurring within the last 10 years: no If all of the above answers are "NO", then may proceed with Cephalosporin use.     Current Medications: Current Outpatient Prescriptions  Medication Sig Dispense Refill  . amLODipine (NORVASC) 10 MG tablet Take 10 mg by mouth at bedtime.    . calcitonin, salmon, (MIACALCIN/FORTICAL) 200 UNIT/ACT nasal spray Place 1 spray into alternate nostrils daily. 3.7 mL 12  . docusate sodium (COLACE) 100 MG capsule Take 200 mg by mouth at bedtime.    . hydrochlorothiazide (HYDRODIURIL) 25 MG tablet     . HYDROcodone-acetaminophen (NORCO) 7.5-325 MG tablet Take 1-2 tablets by mouth every 4 (four) hours as needed for moderate pain. 20 tablet 0  . LORazepam (ATIVAN) 0.5 MG tablet Take 0.5 mg by mouth every 8 (eight) hours as needed for anxiety.   3  . Melatonin 5 MG CAPS Take 5 mg by mouth at bedtime as needed (For sleep.).     Marland Kitchen methocarbamol (ROBAXIN) 500 MG tablet Take 1 tablet (500 mg total) by mouth every 6 (six) hours as needed for muscle spasms. 40 tablet 0  . metoprolol (TOPROL-XL) 200 MG 24 hr tablet Take 100 mg by mouth at bedtime.     . Multiple Vitamin (MULTIVITAMIN WITH MINERALS) TABS tablet Take 1 tablet by mouth daily.    . OMEGA-3 FATTY ACIDS PO Take 2 capsules by mouth daily.     Marland Kitchen omeprazole (PRILOSEC) 20 MG capsule     . ondansetron (ZOFRAN) 4 MG tablet     . polyethylene glycol (MIRALAX / GLYCOLAX) packet Take 17 g by mouth daily. 14 each 0  . prochlorperazine (COMPAZINE) 10 MG  tablet Take 1 tablet (10 mg total) by mouth every 6 (six) hours as needed for nausea or vomiting. 30 tablet 0  . senna (SENOKOT) 8.6 MG TABS tablet Take 1 tablet (8.6 mg total) by mouth daily. 120 each 0  . traZODone (DESYREL) 50 MG tablet Take 50 mg by mouth at bedtime as needed for sleep.      No current facility-administered medications for this visit.     Review of Systems:  GENERAL:  Fatigue.  Some drenching night sweats.  Weight loss of 30 pounds (weight stable since last visit).  PERFORMANCE STATUS (ECOG):  1 HEENT:  Partial loss of vision, episodic x 1 1/2 years.  No mouth sores or tenderness. Lungs:  Shortness of breath on exertion.  Cough.  No hemoptysis. Cardiac:  No chest pain, palpitations, orthopnea, or PND. GI:  Poor appetite.  Constipation on stool softeners.  Acid reflux  on PPI.  No vomiting, diarrhea, melena or hematochezia. GU:  No urgency, frequency, dysuria, or hematuria. Musculoskeletal:  Back pain.  Spinal stenosis.  Knee replaced 10/2015.  No muscle tenderness. Extremities:  No pain or swelling. Skin:  No rashes or skin changes. Neuro:  Headache.  No numbness or weakness.  Balance or coordination off x 2 weeks. Endocrine:  No diabetes, thyroid issues, hot flashes or night sweats. Psych:  No mood changes, depression or anxiety. Pain:  No focal pain. Review of systems:  All other systems reviewed and found to be negative.  Physical Exam: Blood pressure (!) 145/80, pulse 76, temperature (!) 96.8 F (36 C), temperature source Tympanic, resp. rate 18, weight 205 lb 0.4 oz (93 kg). GENERAL:  Well developed, well nourished, heavyset woman sitting comfortably in the exam room in no acute distress. MENTAL STATUS:  Alert and oriented to person, place and time. HEAD:  Brown curly hair.  Normocephalic, atraumatic, face symmetric, no Cushingoid features. EYES:  Glasses.  Blue eyes.  No conjunctivitis or scleral icterus. NEUROLOGICAL:  Appropriate. PSYCH:   Appropriate.   No visits with results within 3 Day(s) from this visit.  Latest known visit with results is:  Admission on 03/09/2016, Discharged on 03/12/2016  Component Date Value Ref Range Status  . Sodium 03/10/2016 138  135 - 145 mmol/L Final  . Potassium 03/10/2016 3.5  3.5 - 5.1 mmol/L Final  . Chloride 03/10/2016 106  101 - 111 mmol/L Final  . CO2 03/10/2016 29  22 - 32 mmol/L Final  . Glucose, Bld 03/10/2016 129* 65 - 99 mg/dL Final  . BUN 03/10/2016 24* 6 - 20 mg/dL Final  . Creatinine, Ser 03/10/2016 1.83* 0.44 - 1.00 mg/dL Final  . Calcium 03/10/2016 12.4* 8.9 - 10.3 mg/dL Final  . GFR calc non Af Amer 03/10/2016 28* >60 mL/min Final  . GFR calc Af Amer 03/10/2016 33* >60 mL/min Final   Comment: (NOTE) The eGFR has been calculated using the CKD EPI equation. This calculation has not been validated in all clinical situations. eGFR's persistently <60 mL/min signify possible Chronic Kidney Disease.   . Anion gap 03/10/2016 3* 5 - 15 Final  . Color, Urine 03/11/2016 STRAW* YELLOW Final  . APPearance 03/11/2016 CLEAR* CLEAR Final  . Glucose, UA 03/11/2016 NEGATIVE  NEGATIVE mg/dL Final  . Bilirubin Urine 03/11/2016 NEGATIVE  NEGATIVE Final  . Ketones, ur 03/11/2016 NEGATIVE  NEGATIVE mg/dL Final  . Specific Gravity, Urine 03/11/2016 1.005  1.005 - 1.030 Final  . Hgb urine dipstick 03/11/2016 NEGATIVE  NEGATIVE Final  . pH 03/11/2016 7.0  5.0 - 8.0 Final  . Protein, ur 03/11/2016 NEGATIVE  NEGATIVE mg/dL Final  . Nitrite 03/11/2016 NEGATIVE  NEGATIVE Final  . Leukocytes, UA 03/11/2016 NEGATIVE  NEGATIVE Final  . RBC / HPF 03/11/2016 NONE SEEN  0 - 5 RBC/hpf Final  . WBC, UA 03/11/2016 0-5  0 - 5 WBC/hpf Final  . Bacteria, UA 03/11/2016 RARE* NONE SEEN Final  . Squamous Epithelial / LPF 03/11/2016 0-5* NONE SEEN Final  . Sodium 03/11/2016 140  135 - 145 mmol/L Final  . Potassium 03/11/2016 3.6  3.5 - 5.1 mmol/L Final  . Chloride 03/11/2016 104  101 - 111 mmol/L Final  .  CO2 03/11/2016 31  22 - 32 mmol/L Final  . Glucose, Bld 03/11/2016 99  65 - 99 mg/dL Final  . BUN 03/11/2016 23* 6 - 20 mg/dL Final  . Creatinine, Ser 03/11/2016 1.58* 0.44 - 1.00 mg/dL Final  .  Calcium 03/11/2016 11.9* 8.9 - 10.3 mg/dL Final  . GFR calc non Af Amer 03/11/2016 34* >60 mL/min Final  . GFR calc Af Amer 03/11/2016 39* >60 mL/min Final   Comment: (NOTE) The eGFR has been calculated using the CKD EPI equation. This calculation has not been validated in all clinical situations. eGFR's persistently <60 mL/min signify possible Chronic Kidney Disease.   . Anion gap 03/11/2016 5  5 - 15 Final  . SURGICAL PATHOLOGY 03/13/2016    Final                   Value:Surgical Pathology CASE: [id number] PATIENT: Jill Bond Surgical Pathology Report     SPECIMEN SUBMITTED: A. Lymph node, right supraclavicular  CLINICAL HISTORY: HX of multiple pulm nodule and remote renal cell CA  PRE-OPERATIVE DIAGNOSIS: None provided  POST-OPERATIVE DIAGNOSIS: None provided.     DIAGNOSIS: A. LYMPH NODE, RIGHT SUPRACLAVICULAR; ULTRASOUND-GUIDED BIOPSY: - NON-NECROTIZING GRANULOMATOUS INFLAMMATION WITH FIBROSIS. - NEGATIVE FOR MALIGNANCY.  Note: GMS-fungal, AFB and Gram stains are negative with working controls. The clinical history is reviewed. Per [facility] pathology records, the right nephrectomy specimen showed multiple angiomyolipomas without evidence of renal cell carcinoma ([id number]). The differential diagnosis includes infection, occupational exposure, and sarcoidosis once all of the possible causes are excluded. Clinical correlation and follow-up with microbiology cultures is necessary.   GROSS DESCRIPTION:                           A. Labeled: right supraclavicular lymph node  Tissue fragment(s): 2  Size: 0.8 and 1.3 cm in length and 0.1 cm in diameter  Description: tan to red cores, wrapped in lens paper and submitted and a mesh bag  4 Diff Quik  stained slides also prepared  Entirely submitted in 1-2 cassette(s).    Final Diagnosis performed by Delorse Lek, MD.  Electronically signed 03/13/2016 11:23:44AM    The electronic signature indicates that the named Attending Pathologist has evaluated the specimen  Technical component performed at [facility], [address] [place], [place] [address] Lab: [phone] Dir: Darrick Penna. Evette Doffing, MD  Professional component performed at [facility], [place], [place], [place] [address] Lab: [phone] Dir: Dellia Nims. Reuel Derby, MD    . CYTOLOGY - NON GYN 03/13/2016    Final                   Value:Cytology - Non PAP CASE: [id number] PATIENT: Jill Bond Non-Gyn Cytology Report     SPECIMEN SUBMITTED: A. Lymph node, right supraclavicular  CLINICAL HISTORY: HX of multiple plum nodules and remote renal cell CA  PRE-OPERATIVE DIAGNOSIS: None provided  POST-OPERATIVE DIAGNOSIS: None provided.     DIAGNOSIS: A. LYMPH NODE, RIGHT SUPRACLAVICULAR; ULTRASOUND-GUIDED FINE-NEEDLE ASPIRATION: - NEGATIVE FOR MALIGNANCY. - INFLAMMATION AND EPITHELIOID HISTIOCYTES.  Note: ThinPrep, Diff Quik and Pap stained slides were reviewed. A sample was sent for flow cytometry ([facility], Accession #[id number]) with the following interpretation: Very limited specimen showing no immunophenotypic evidence of a monoclonal B cell population in an 8% viable sample.   GROSS DESCRIPTION:  A. Site: right supraclavicular lymph node Procedure: ultrasound Cytotechnologist: Ashlee Howze Specimen(s) collected: 4 Diff Quik stained slides 4 Pap stained sl                         ides Specimen labeled right supraclavicular lymph node:      Description: clear CytoLyt solution  Submitted for:           ThinPrep  Specimen in RPMI for flow cytometry A forceps biopsy was obtained and will be reported in a separate ARS report. Final Diagnosis  performed by Glenice Bow, MD.  Electronically signed 03/13/2016 1:20:44PM    The electronic signature indicates that the named Attending Pathologist has evaluated the specimen  Technical component performed at [facility], [address], [place], [place] [address] Lab: [phone] Dir: Titus Dubin. Cato Mulligan, MD  Professional component performed at [facility], [address] [place], [place], [place] [address] Lab: [phone] Dir: Georgiann Cocker. Rubinas, MD    . WBC 03/12/2016 3.6  3.6 - 11.0 K/uL Final  . RBC 03/12/2016 4.23  3.80 - 5.20 MIL/uL Final  . Hemoglobin 03/12/2016 12.8  12.0 - 16.0 g/dL Final  . HCT 55/63/6330 37.3  35.0 - 47.0 % Final  . MCV 03/12/2016 88.3  80.0 - 100.0 fL Final  . MCH 03/12/2016 30.3  26.0 - 34.0 pg Final  . MCHC 03/12/2016 34.3  32.0 - 36.0 g/dL Final  . RDW 19/73/6149 14.3  11.5 - 14.5 % Final  . Platelets 03/12/2016 134* 150 - 440 K/uL Final  . Sodium 03/12/2016 139  135 - 145 mmol/L Final  . Potassium 03/12/2016 3.4* 3.5 - 5.1 mmol/L Final  . Chloride 03/12/2016 106  101 - 111 mmol/L Final  . CO2 03/12/2016 27  22 - 32 mmol/L Final  . Glucose, Bld 03/12/2016 124* 65 - 99 mg/dL Final  . BUN 29/88/2136 20  6 - 20 mg/dL Final  . Creatinine, Ser 03/12/2016 1.36* 0.44 - 1.00 mg/dL Final  . Calcium 53/66/4017 11.7* 8.9 - 10.3 mg/dL Final  . GFR calc non Af Amer 03/12/2016 40* >60 mL/min Final  . GFR calc Af Amer 03/12/2016 47* >60 mL/min Final   Comment: (NOTE) The eGFR has been calculated using the CKD EPI equation. This calculation has not been validated in all clinical situations. eGFR's persistently <60 mL/min signify possible Chronic Kidney Disease.   Eustaquio Boyden gap 03/12/2016 6  5 - 15 Final    Assessment:  Jill Bond is a 65 y.o. female with probable sarcoidosis.  Ultrasound guided supraclavicular lymph node biopsy on 03/11/2016 revealed non-necrotizing granulomatous inflammation with fibrosis.  There was no evidence of  malignancy.  GMS-fungal, AFB and Gram stains were negative.  She presented with small upper abdominal adenopathy, bibasilar lung nodes, a 30 pound weight loss, some drenching night sweats, and mild hypercalcemia.    She has a history of multiple angiomyolipomas s/p left nephrectomy on 03/15/2006.  Baseline creatinine is 0.9 - 1.1 (CrCl 50-63 ml/min).  Abdominal and pelvic CT scan on 02/26/2016 revealed multiple new bibasilar lung nodules measuring up to 5 mm in size. There was new para-aortic and gastrohepatic lymph nodes measuring up to 1 cm in size, suspicious for metastatic disease.  Labs on 02/11/2016 revealed a creatinine of 1.3 (GFR of 41 ml/min), calcium was 11.7 (8.7-10.3), AST 44 and ALT 50.  Urinalysis revealed trace protein.  Hepatitis A,B, and C serologies were negative.  SPEP was negative.  PTH was low.  PTH-related polypeptide was < 1.1 on 03/09/2016.  Mammogram on 03/03/2016 revealed no evidence of malignancy.  Colonoscopy in 2013 revealed polyps (next scheduled 2018).  She had a partial hysterectomy at age 48.   Symptomatically, she notes back discomfort.  Plan: 1.  Review pathology from supraclavicular lymph node biopsy.  Discuss diagnosis of sarcoid.  Information on sarcoid  provided.  Discuss referral to pulmonary medicine for management. 2.  Labs:  ACE level. 3.  Consult Dr. Ashby Dawes of pulmonary medicine. 4.  RTC prn.  Addendum:  Pathology from the left nephrectomy specimen showed multiple angiomyolipomas without evidence of renal cell carcinoma ([id number]).   Lequita Asal, MD  03/20/2016, 11:37 AM

## 2016-03-20 NOTE — Progress Notes (Signed)
Patient here for results today. 

## 2016-03-20 NOTE — Patient Instructions (Signed)
Sarcoidosis  Sarcoidosis is a disease that causes inflammation in your organs and other areas of your body. The lungs are most often affected (pulmonary sarcoidosis). Sarcoidosis can also affect your lymph nodes, liver, eyes, skin, or any other body tissue.  When you have sarcoidosis, small clumps of tissue (granulomas) form in the affected area of your body. Granulomas are made up of your body's defense (immune) cells. Inflammation results when your body reacts to a harmful substance. Normally, inflammation goes away after immune cells get rid of the harmful substance. In sarcoidosis, the immune cells form granulomas instead.  CAUSES   The exact cause of sarcoidosis is not known. Something triggers the immune system to respond, such as dust, chemicals, bacteria, or a virus.   RISK FACTORS  You may be at a greater risk for sarcoidosis if you:   · Have a family history of the disease.  · Are African American.  · Are of Northern European ancestry.  · Are 20-50 years old.  · Are female.  SIGNS AND SYMPTOMS   Many people with sarcoidosis have no symptoms. Others have very mild symptoms. Sarcoidosis most often affects the lungs. Symptoms include:  · Chest pain.  · Coughing.  · Wheezing.  · Shortness of breath.  Other common symptoms include:   · Night sweats.  · Weight loss.  · Fatigue.  · Depression.  · A sense of uneasiness.  DIAGNOSIS   Sarcoidosis may be diagnosed by:   · Medical history and physical exam.  · Chest X-ray. This looks for granulomas in your lungs.  · Lung function tests. These measure your breathing and look for problems related to sarcoidosis.  · Examining a sample of tissue under a microscope (biopsy).  TREATMENT   Sarcoidosis usually clears up without treatment. You may take medicines to reduce inflammation or relieve symptoms. These may include:  · Prednisone. This steroid reduces inflammation related to sarcoidosis.  · Chloroquine or hydroxychloroquine. These are antimalarial medicines used to  treat sarcoidosis that affects the skin or brain.  · Methotrexate, leflunomide, or azathioprine. These medicines affect the immune system and can help with sarcoidosis in the joints, eyes, skin, or lungs.  · Inhalers. Inhaled medicines can help you breathe if sarcoidosis is affecting your lungs.  HOME CARE INSTRUCTIONS  · Do not use any tobacco products, including cigarettes, chewing tobacco, or electronic cigarettes. If you need help quitting, ask your health care provider.  · Avoid secondhand smoke.  · Avoid irritating dust and chemicals. Stay indoors on days when air quality is poor in your area.  · Take medicines only as directed by your health care provider.  SEEK MEDICAL CARE IF:  · You have vision problems.  · You have shortness of breath.  · You have a dry, persistent cough.  · You have an irregular heartbeat.  · You have pain or ache in your joints, hands, or feet.  · You have an unexplained rash.  SEEK IMMEDIATE MEDICAL CARE IF:   You have chest pain.     This information is not intended to replace advice given to you by your health care provider. Make sure you discuss any questions you have with your health care provider.     Document Released: 02/26/2004 Document Revised: 05/18/2014 Document Reviewed: 08/23/2013  Elsevier Interactive Patient Education ©2016 [employer]

## 2016-03-21 ENCOUNTER — Encounter: Payer: Self-pay | Admitting: Hematology and Oncology

## 2016-03-21 LAB — ANGIOTENSIN CONVERTING ENZYME: Angiotensin-Converting Enzyme: 85 U/L — ABNORMAL HIGH (ref 14–82)

## 2016-03-25 ENCOUNTER — Ambulatory Visit (INDEPENDENT_AMBULATORY_CARE_PROVIDER_SITE_OTHER): Payer: Commercial Managed Care - HMO | Admitting: Internal Medicine

## 2016-03-25 ENCOUNTER — Encounter: Payer: Self-pay | Admitting: Internal Medicine

## 2016-03-25 VITALS — BP 132/88 | HR 65 | Wt 204.0 lb

## 2016-03-25 DIAGNOSIS — D869 Sarcoidosis, unspecified: Secondary | ICD-10-CM

## 2016-03-25 DIAGNOSIS — N189 Chronic kidney disease, unspecified: Secondary | ICD-10-CM

## 2016-03-25 MED ORDER — PREDNISONE 10 MG PO TABS: ORAL_TABLET | ORAL | 0 refills | Status: DC

## 2016-03-25 NOTE — Progress Notes (Signed)
[facility] Consultation      Assessment and Plan:  Sarcoidosis.  -The diagnosis of sarcoidosis was discussed in detail with both the patient and her daughter today. We discussed that we will start a burst and slow taper of prednisone over the next 3 months, and I will follow-up with the patient at that time. -We will repeat check a complete metabolic panel to look for evidence of improvement in her hypercalcemia and renal function. -The patient already has a baseline ECG in our system. -Patient is referred to an ophthalmologist for an eye exam. -. Her primary symptoms appear to be night sweats, fatigue, reduced appetite with weight loss, we will monitor the symptoms closely over the next few weeks and months.  Mediastinal lymphadenopathy.  -The patient's mediastinal lymphadenopathy is likely secondary to sarcoidosis, would not need to follow with serial scanning, but will rather treat the symptoms.   Chronic kidney disease.  -The patient is status post right nephrectomy, now with chronic renal failure. -Uncertain. The patient's renal failure is due to comorbid conditions versus sarcoidosis. We will follow her renal function as we treat with prednisone, if her renal function is secondary to sarcoidosis. We would expect to see some improvement in her kidney function.   Date: 03/25/2016  MRN# [id number] Jill Bond 02-16-1951  Referring Physician: Dr. Mike Bond.   Jill Bond is a 65 y.o. old female seen in consultation for chief complaint of:    Chief Complaint  Patient presents with  . Advice Only    SOB w/activity: hacky cough worse over last 2 mths:     HPI:     The patient was last seen in the medical oncology clinic on 03/09/2016 for initial consultation.  She was noted to have small upper abdominal adenopathy and bibasilar lung nodes.  She had a 30 pound weight loss and some drenching night sweats suggestive of lymphoma.  She had a history of  "renal cell carcinoma" s/p left nephrectomy on 03/15/2006.  Pathology was unavailable at last visit.  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  Baseline creatinine was 0.9 - 1.1 (CrCl 50-63 ml/min).  Patient notes that sometime in September of 2017 she had routine bloodwork she was noted to have hypercalcemia and she was sent for a CT chest and abdomen. She was then referred to Dr. Mike Bond in oncology. She had a right supraclav lymph node biopsy which showed granulomas, she was discussed at tumor board which noted a likely diagnosis of sarcoid. Her daughter is present today who provides much of the history. She has been having night sweats for the past 2-3 months ago. She has lost about 20 to 30 lbs over the past several months due to reduced appetite.   Labs revealed a creatinine of 2.07 (CrCl 24 ml/min), calcium 13.1, and uric acid 9.1.  PTH-related polypeptide was < 1.1 (normal).  The patient was admitted secondary to acute renal insufficiency and hypercalcemia.  Her work-up was expedited.   She was admitted to [facility] from 03/09/2016 - 03/12/2016.  Chest CT on 03/10/2016 revealed numerous bilateral pulmonary nodules, measuring up to 9 mm in the central left lower lobe. Bone scan on 03/10/2016 revealed no findings to suggest primary or metastatic malignancy within the bones. Uptake in the lower lumbar spine, knees, ankles, and right foot were most compatible with degenerative and postsurgical changes.  Thoracic lymphadenopathy, including a dominant 12 mm short axis node at the left thoracic inlet and a 15 mm short  axis right hilar node.  She underwent ultrasound guided supraclavicular lymph node biopsy on 03/11/2016.  Pathology revealed non-necrotizing granulomatous inflammation with fibrosis.  There was no evidence of malignancy.  GMS-fungal, AFB and Gram stains were negative.  Pathology from the left nephrectomy specimen showed multiple angiomyolipomas without evidence  of renal cell carcinoma [id number]).   PMHX:   Past Medical History:  Diagnosis Date  . Anxiety   . Arthritis   . Chronic kidney disease 2006   Left Nephrectomy  . Complication of anesthesia    PONV  . Constipation   . Depression   . GERD (gastroesophageal reflux disease)   . Headache   . Hypertension   . PONV (postoperative nausea and vomiting)   . Rotator cuff disorder    LEFT  . Shortness of breath dyspnea    with exertion   Surgical Hx:  Past Surgical History:  Procedure Laterality Date  . ABDOMINAL HYSTERECTOMY     PARTIAL  . ACHILLES TENDON SURGERY Left 02/22/2015   Procedure: Secondary ACHILLES TENDON REPAIR;  Surgeon: Jill Bond, DPM;  Location: [facility] ORS;  Service: Podiatry;  Laterality: Left;  . Bladder tac    . BREAST BIOPSY Left 2003   neg  . COLONOSCOPY  2013  . JOINT REPLACEMENT Left 2007   Total Knee Replacement  . OSTECTOMY Left 02/22/2015   Procedure: OSTECTOMY;  Surgeon: Jill Bond, DPM;  Location: [facility] ORS;  Service: Podiatry;  Laterality: Left;  . SHOULDER ARTHROSCOPY W/ ROTATOR CUFF REPAIR Left YRS AGO  . TONSILLECTOMY  AGE 84  . TOTAL KNEE ARTHROPLASTY Right 10/22/2015   Procedure: RIGHT TOTAL KNEE ARTHROPLASTY;  Surgeon: Jill Cancel, MD;  Location: [facility] ORS;  Service: Orthopedics;  Laterality: Right;  . TOTAL KNEE ARTHROPLASTY Right    10/22/2015   Family Hx:  Family History  Problem Relation Age of Onset  . Breast cancer Mother 76  . Heart failure Father   . Prostate cancer Father    Social Hx:   Social History  Substance Use Topics  . Smoking status: Never Smoker  . Smokeless tobacco: Never Used  . Alcohol use No   Medication:   Reviewed.     Allergies:  Codeine and Penicillins  Review of Systems: Gen:  Denies  fever, sweats, chills HEENT: Denies blurred vision, double vision. bleeds, sore throat Cvc:  No dizziness, chest pain. Resp:   Denies cough or sputum production, shortness of breath Gi: Denies swallowing  difficulty, stomach pain. Gu:  Denies bladder incontinence, burning urine Ext:   No Joint pain, stiffness. Skin: No skin rash,  hives  Endoc:  No polyuria, polydipsia. Psych: No depression, insomnia. Other:  All other systems were reviewed with the patient and were negative other that what is mentioned in the HPI.   Physical Examination:   VS: BP 132/88 (BP Location: Left Arm, Cuff Size: Normal)   Pulse 65   Wt 204 lb (92.5 kg)   SpO2 99%   BMI 35.02 kg/m   General Appearance: No distress  Neuro:without focal findings,  speech normal,  HEENT: PERRLA, EOM intact.   Pulmonary: normal breath sounds, No wheezing.  CardiovascularNormal S1,S2.  No m/r/g.   Abdomen: Benign, Soft, non-tender. Renal:  No costovertebral tenderness  GU:  No performed at this time. Endoc: No evident thyromegaly, no signs of acromegaly. Skin:   warm, no rashes, no ecchymosis  Extremities: normal, no cyanosis, clubbing.  Other findings:    LABORATORY PANEL:   CBC No results for input(s):  WBC, HGB, HCT, PLT in the last 168 hours. ------------------------------------------------------------------------------------------------------------------  Chemistries  No results for input(s): NA, K, CL, CO2, GLUCOSE, BUN, CREATININE, CALCIUM, MG, AST, ALT, ALKPHOS, BILITOT in the last 168 hours.  Invalid input(s): GFRCGP ------------------------------------------------------------------------------------------------------------------  Cardiac Enzymes No results for input(s): TROPONINI in the last 168 hours. ------------------------------------------------------------  RADIOLOGY:  No results found.     Thank  you for the consultation and for allowing [facility] to assist in the care of your patient. Our recommendations are noted above.  Please contact us if we can be of further service.   Marda Stalker, MD.  Board Certified in Internal Medicine, Pulmonary Medicine, [place], and Sleep Medicine.  [facility] Office Number: [phone]  Bond Pesa, M.D.  Vilinda Boehringer, M.D.  Merton Border, M.D  03/25/2016

## 2016-03-25 NOTE — Patient Instructions (Addendum)
--  Prednisone 10 mg tabs, take 4 tablets for one week,  Then take 3 tablets daily  (30 mg) daily for one month,  then 2 tabs (20 mg) daily for one month,  then remain on 1 tablet daily until next office visit.   --Complete metabolic panel in 3-4 weeks.    --For information about sarcoidos, check out the sarcoidosis network, which is a good and accurate source of sarcoidosis information.   --Will refer to opthalmologist for sarcoidosis eye exam.

## 2016-04-13 DIAGNOSIS — J042 Acute laryngotracheitis: Secondary | ICD-10-CM | POA: Diagnosis not present

## 2016-05-12 DIAGNOSIS — D869 Sarcoidosis, unspecified: Secondary | ICD-10-CM | POA: Diagnosis not present

## 2016-07-08 DIAGNOSIS — M79671 Pain in right foot: Secondary | ICD-10-CM | POA: Diagnosis not present

## 2016-07-08 DIAGNOSIS — M659 Synovitis and tenosynovitis, unspecified: Secondary | ICD-10-CM | POA: Diagnosis not present

## 2016-07-18 NOTE — Progress Notes (Deleted)
[facility] Consultation      Assessment and Plan:  Sarcoidosis.  -The diagnosis of sarcoidosis was discussed in detail with both the patient and her daughter today. We discussed that we will start a burst and slow taper of prednisone over the next 3 months, and I will follow-up with the patient at that time. -We will repeat check a complete metabolic panel to look for evidence of improvement in her hypercalcemia and renal function. -The patient already has a baseline ECG in our system. -Patient is referred to an ophthalmologist for an eye exam. -. Her primary symptoms appear to be night sweats, fatigue, reduced appetite with weight loss, we will monitor the symptoms closely over the next few weeks and months.  Mediastinal lymphadenopathy.    Chronic kidney disease.  -The patient is status post right nephrectomy, now with chronic renal failure. -Uncertain. The patient's renal failure is due to comorbid conditions versus sarcoidosis. We will follow her renal function as we treat with prednisone, if her renal function is secondary to sarcoidosis. We would expect to see some improvement in her kidney function.   Date: 07/18/2016  MRN# [id number] Jill Bond [birth year]  Referring Physician: Dr. Mike Gip.   Jill Bond is a 66 y.o. old female seen in consultation for chief complaint of:    No chief complaint on file.   HPI:  The patient is a who was found to have sarcoidosis on workup of of hypercalcemia, and lymphadenopathy. Left supraclavicular lymph node biopsy showed granulomas c/w sarcoidosis.  She had a history of "renal cell carcinoma" s/p left nephrectomy on 03/15/2006.At last visit she was put on a tapering dose of prednisone and asked to repeat a metabolic panel in a few weeks.     Labs revealed a creatinine of 2.07 (CrCl 24 ml/min), calcium 13.1, and uric acid 9.1.  PTH-related polypeptide was < 1.1 (normal).  The patient was admitted secondary  to acute renal insufficiency and hypercalcemia.  Her work-up was expedited.   Medication:   Reviewed.     Allergies:  Codeine and Penicillins  Review of Systems: Gen:  Denies  fever, sweats, chills HEENT: Denies blurred vision, double vision. bleeds, sore throat Cvc:  No dizziness, chest pain. Resp:   Denies cough or sputum production, shortness of breath Gi: Denies swallowing difficulty, stomach pain. Gu:  Denies bladder incontinence, burning urine Ext:   No Joint pain, stiffness. Skin: No skin rash,  hives  Endoc:  No polyuria, polydipsia. Psych: No depression, insomnia. Other:  All other systems were reviewed with the patient and were negative other that what is mentioned in the HPI.   Physical Examination:   VS: There were no vitals taken for this visit.  General Appearance: No distress  Neuro:without focal findings,  speech normal,  HEENT: PERRLA, EOM intact.   Pulmonary: normal breath sounds, No wheezing.  CardiovascularNormal S1,S2.  No m/r/g.   Abdomen: Benign, Soft, non-tender. Renal:  No costovertebral tenderness  GU:  No performed at this time. Endoc: No evident thyromegaly, no signs of acromegaly. Skin:   warm, no rashes, no ecchymosis  Extremities: normal, no cyanosis, clubbing.  Other findings:    LABORATORY PANEL:   CBC No results for input(s): WBC, HGB, HCT, PLT in the last 168 hours. ------------------------------------------------------------------------------------------------------------------  Chemistries  No results for input(s): NA, K, CL, CO2, GLUCOSE, BUN, CREATININE, CALCIUM, MG, AST, ALT, ALKPHOS, BILITOT in the last 168 hours.  Invalid input(s): GFRCGP ------------------------------------------------------------------------------------------------------------------  Cardiac Enzymes No results  for input(s): TROPONINI in the last 168 hours. ------------------------------------------------------------  RADIOLOGY:  No results  found.     Thank  you for the consultation and for allowing [facility] to assist in the care of your patient. Our recommendations are noted above.  Please contact us if we can be of further service.   Marda Stalker, MD.  Board Certified in Internal Medicine, Pulmonary Medicine, [place], and Sleep Medicine.  [facility] Office Number: [phone]  Patricia Pesa, M.D.  Vilinda Boehringer, M.D.  Merton Border, M.D  07/18/2016

## 2016-07-20 ENCOUNTER — Ambulatory Visit: Payer: Commercial Managed Care - HMO | Admitting: Internal Medicine

## 2016-07-20 ENCOUNTER — Encounter: Payer: Self-pay | Admitting: *Deleted

## 2016-09-07 NOTE — Progress Notes (Addendum)
[facility] Consultation      Assessment and Plan:  Sarcoidosis.  -The diagnosis of sarcoidosis was discussed in detail with both the patient and her daughter  Again today. We discussed the possibility of starting prednisone again, depending upon her lab results. We'll recheck an Ace level, calcium, renal panel, all which were elevated at previous visit. Her symptoms appear improved, she has gained approximately 15 pounds. -We will repeat check a complete metabolic panel to look for evidence of improvement in her hypercalcemia and renal function. -The patient already has a baseline ECG in our system. -Patient is referred to an ophthalmologist for an eye exam.  Mediastinal lymphadenopathy.  -The patient's mediastinal lymphadenopathy is likely secondary to sarcoidosis, would not need to follow with serial scanning, but will rather treat the symptoms.   Chronic kidney disease.  -The patient is status post right nephrectomy, now with chronic renal failure. -Uncertain. The patient's renal failure is due to comorbid conditions versus sarcoidosis. We will follow her renal function as we treat with prednisone, if her renal function is secondary to sarcoidosis. We would expect to see some improvement in her kidney function.  Insomnia --Currently on melatonin and ativan qhs.  --She ran out of trazodone, asked not to restart as she is already on ativan, and her symptoms appear controlled with this.    Date: 09/07/2016  MRN# [id number] Jill Bond 1950/05/13  Referring Physician: Dr. Mike Gip.   Jill Bond is a 66 y.o. old female seen in consultation for chief complaint of:    Chief Complaint  Patient presents with  . Follow-up  . Sarcoidosis    HPI:   The patient follows up because of a recent diagnosis of sarcoidosis, which was confirmed on supraclavicular lymph node biopsy, she was also noted to have small upper abdominal adenopathy and bibasilar lung  nodes, as well as hypercalcemia and a 30 pound weight loss and some drenching night sweats.  She had a history of "renal cell carcinoma" s/p left nephrectomy on 03/15/2006.  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  Baseline creatinine was 0.9 - 1.1 (CrCl 50-63 ml/min).  At her last visit she was started on prednisone 40 mg daily for 1 week, then she was asked to take 30 mg decrease by 10 mg once monthly. She was asked to follow-up with repeat blood work.She did not remain on prednisone 10 mg, she has been off prednisone for about the last month. Her daughter notes that her symptoms are steadily improved, she no longer has the night sweats, fevers and dyspnea. She did before. Patient notes a chronic cough which is been present even through the course of prednisone. She was previously on calcitonin nasal spray for hypercalcemia, she is no longer on this.  She has been taking melatonin as well as her dose of ativan at night which helps with her insomnia. She had been taking trazodone as well but ran out.    She underwent ultrasound guided supraclavicular lymph node biopsy on 03/11/2016.  Pathology revealed non-necrotizing granulomatous inflammation with fibrosis.  There was no evidence of malignancy.  GMS-fungal, AFB and Gram stains were negative.  Pathology from the left nephrectomy specimen showed multiple angiomyolipomas without evidence of renal cell carcinoma ([id number]). Medication:    Reviewed.     Allergies:  Codeine and Penicillins  Review of Systems: Gen:  Denies  fever, sweats, chills HEENT: Denies blurred vision, double vision. bleeds, sore throat Cvc:  No dizziness, chest pain. Resp:  Denies cough or sputum production, shortness of breath Gi: Denies swallowing difficulty, stomach pain. Gu:  Denies bladder incontinence, burning urine Ext:   No Joint pain, stiffness. Skin: No skin rash,  hives  Endoc:  No polyuria, polydipsia. Psych: No  depression, insomnia. Other:  All other systems were reviewed with the patient and were negative other that what is mentioned in the HPI.   Physical Examination:   VS: BP (!) 130/100 (BP Location: Left Arm, Patient Position: Sitting, Cuff Size: Normal)   Pulse 79   Resp 16   Ht 5\' 4"  (1.626 m)   Wt 221 lb (100.2 kg)   SpO2 97%   BMI 37.93 kg/m   General Appearance: No distress  Neuro:without focal findings,  speech normal,  HEENT: PERRLA, EOM intact.   Pulmonary: normal breath sounds, No wheezing.  CardiovascularNormal S1,S2.  No m/r/g.   Abdomen: Benign, Soft, non-tender. Renal:  No costovertebral tenderness  GU:  No performed at this time. Endoc: No evident thyromegaly, no signs of acromegaly. Skin:   warm, no rashes, no ecchymosis  Extremities: normal, no cyanosis, clubbing.  Other findings:    LABORATORY PANEL:   CBC No results for input(s): WBC, HGB, HCT, PLT in the last 168 hours. ------------------------------------------------------------------------------------------------------------------  Chemistries  No results for input(s): NA, K, CL, CO2, GLUCOSE, BUN, CREATININE, CALCIUM, MG, AST, ALT, ALKPHOS, BILITOT in the last 168 hours.  Invalid input(s): GFRCGP ------------------------------------------------------------------------------------------------------------------  Cardiac Enzymes No results for input(s): TROPONINI in the last 168 hours. ------------------------------------------------------------  RADIOLOGY:  No results found.     Thank  you for the consultation and for allowing [facility] to assist in the care of your patient. Our recommendations are noted above.  Please contact us if we can be of further service.   Marda Stalker, MD.  Board Certified in Internal Medicine, Pulmonary Medicine, [place], and Sleep Medicine.  [facility] Office Number: [phone]  Patricia Pesa,  M.D.  Vilinda Boehringer, M.D.  Merton Border, M.D  09/07/2016

## 2016-09-08 ENCOUNTER — Encounter: Payer: Self-pay | Admitting: Internal Medicine

## 2016-09-08 ENCOUNTER — Ambulatory Visit (INDEPENDENT_AMBULATORY_CARE_PROVIDER_SITE_OTHER): Payer: Medicare HMO | Admitting: Internal Medicine

## 2016-09-08 ENCOUNTER — Ambulatory Visit
Admission: RE | Admit: 2016-09-08 | Discharge: 2016-09-08 | Disposition: A | Discharge status: Home / Self Care | Payer: Medicare HMO | Source: Ambulatory Visit | Attending: Internal Medicine | Admitting: Internal Medicine

## 2016-09-08 ENCOUNTER — Other Ambulatory Visit
Admission: RE | Admit: 2016-09-08 | Discharge: 2016-09-08 | Disposition: A | Discharge status: Home / Self Care | Payer: Medicare HMO | Source: Ambulatory Visit | Attending: Internal Medicine | Admitting: Internal Medicine

## 2016-09-08 VITALS — BP 130/100 | HR 79 | Resp 16 | Ht 64.0 in | Wt 221.0 lb

## 2016-09-08 DIAGNOSIS — R911 Solitary pulmonary nodule: Secondary | ICD-10-CM | POA: Insufficient documentation

## 2016-09-08 DIAGNOSIS — N189 Chronic kidney disease, unspecified: Secondary | ICD-10-CM

## 2016-09-08 DIAGNOSIS — D869 Sarcoidosis, unspecified: Secondary | ICD-10-CM

## 2016-09-08 DIAGNOSIS — R079 Chest pain, unspecified: Secondary | ICD-10-CM | POA: Diagnosis not present

## 2016-09-08 LAB — CBC WITH DIFFERENTIAL/PLATELET
Basophils Absolute: 0 10*3/uL (ref 0–0.1)
Basophils Relative: 1 %
EOS ABS: 0.4 10*3/uL (ref 0–0.7)
EOS PCT: 8 %
HCT: 42.6 % (ref 35.0–47.0)
Hemoglobin: 14.5 g/dL (ref 12.0–16.0)
LYMPHS ABS: 0.9 10*3/uL — AB (ref 1.0–3.6)
Lymphocytes Relative: 16 %
MCH: 30.6 pg (ref 26.0–34.0)
MCHC: 34.1 g/dL (ref 32.0–36.0)
MCV: 89.6 fL (ref 80.0–100.0)
MONO ABS: 0.8 10*3/uL (ref 0.2–0.9)
Monocytes Relative: 14 %
Neutro Abs: 3.3 10*3/uL (ref 1.4–6.5)
Neutrophils Relative %: 61 %
PLATELETS: 190 10*3/uL (ref 150–440)
RBC: 4.75 MIL/uL (ref 3.80–5.20)
RDW: 13.5 % (ref 11.5–14.5)
WBC: 5.5 10*3/uL (ref 3.6–11.0)

## 2016-09-08 LAB — COMPREHENSIVE METABOLIC PANEL
ALBUMIN: 4.1 g/dL (ref 3.5–5.0)
ALT: 40 U/L (ref 14–54)
AST: 36 U/L (ref 15–41)
Alkaline Phosphatase: 104 U/L (ref 38–126)
Anion gap: 8 (ref 5–15)
BILIRUBIN TOTAL: 0.9 mg/dL (ref 0.3–1.2)
BUN: 24 mg/dL — AB (ref 6–20)
CO2: 31 mmol/L (ref 22–32)
CREATININE: 1 mg/dL (ref 0.44–1.00)
Calcium: 10.6 mg/dL — ABNORMAL HIGH (ref 8.9–10.3)
Chloride: 102 mmol/L (ref 101–111)
GFR calc Af Amer: >60 mL/min (ref >60)
GFR, EST NON AFRICAN AMERICAN: 58 mL/min — AB (ref >60)
GLUCOSE: 103 mg/dL — AB (ref 65–99)
Potassium: 3.7 mmol/L (ref 3.5–5.1)
Sodium: 141 mmol/L (ref 135–145)
TOTAL PROTEIN: 7.6 g/dL (ref 6.5–8.1)

## 2016-09-08 NOTE — Patient Instructions (Addendum)
--  Will check CBC with differential, complete metabolic panel, ACE levels.  --Will check CXR 2 view.  --Will check TB quantiferon in anticipation of starting another medication such as methotrexate.

## 2016-09-08 NOTE — Addendum Note (Signed)
Addended by: Oscar La R on: 09/08/2016 11:08 AM   Modules accepted: Orders

## 2016-09-09 LAB — ANGIOTENSIN CONVERTING ENZYME: Angiotensin-Converting Enzyme: 72 U/L (ref 14–82)

## 2016-09-10 LAB — QUANTIFERON IN TUBE
QFT TB AG MINUS NIL VALUE: 0.01 IU/mL
QUANTIFERON MITOGEN VALUE: 4.32 [IU]/mL
QUANTIFERON NIL VALUE: 0.06 [IU]/mL
QUANTIFERON TB AG VALUE: 0.07 [IU]/mL
QUANTIFERON TB GOLD: NEGATIVE

## 2016-09-10 LAB — QUANTIFERON TB GOLD ASSAY (BLOOD)

## 2016-09-11 ENCOUNTER — Telehealth: Payer: Self-pay | Admitting: *Deleted

## 2016-09-11 MED ORDER — PREDNISONE 10 MG PO TABS: 10.0000 mg | ORAL_TABLET | Freq: Every day | ORAL | 1 refills | Status: DC

## 2016-09-11 MED ORDER — METHOTREXATE 2.5 MG PO TABS: ORAL_TABLET | ORAL | 0 refills | Status: DC

## 2016-09-11 NOTE — Telephone Encounter (Signed)
test results  Received: [place], MD  Renelda Mom, LPN        Discussed with patient's daughter blood testing suggest that her sarcoidosis is still active. Will need to start the following.   Prednisone 10 mg tabs, take 2 daily for a week, then go back to once daily until seen again.   Methotrexate, take 2.5 mg tabs; Take one tab ONCE PER WEEK, on first week. Then go to 2 tabs once per week until seen again.   Please explain regimen to patient (I spoke with daughter).    Called number listed which is the daughter who DR spoke with. She ask me to call [phone] to speak with pt.  Spoke with pt and informed her of response from DR and what mediations were being sent in to the [facility]. Pt verbalized understanding. Nothing further needed.

## 2016-09-11 NOTE — Telephone Encounter (Signed)
-----   Message from Laverle Hobby, MD sent at 09/10/2016  4:10 PM EDT ----- Regarding: blood test results.  I informed pt's daughter of test results which showed continued sarcoidosis, and plan, please inform pt.   -Prednisone 10 mg tabs, take 2 daily for a week, then go to one tab daily until follow up.   -Methotrexate 2.5 mg tabs; take one tab ONCE WEEKLY, for first week, then increase to 2 tabs once weekly. If tolerated after first month, go to three tabs once weekly. Continue this until next appointment.

## 2016-10-11 ENCOUNTER — Other Ambulatory Visit: Payer: Self-pay | Admitting: Internal Medicine

## 2016-11-06 ENCOUNTER — Other Ambulatory Visit: Payer: Self-pay | Admitting: Internal Medicine

## 2016-12-08 ENCOUNTER — Ambulatory Visit: Payer: Medicare HMO

## 2016-12-14 ENCOUNTER — Ambulatory Visit: Payer: Medicare HMO | Admitting: Internal Medicine

## 2016-12-15 ENCOUNTER — Ambulatory Visit: Payer: Medicare HMO | Attending: Internal Medicine

## 2016-12-15 DIAGNOSIS — D869 Sarcoidosis, unspecified: Secondary | ICD-10-CM | POA: Insufficient documentation

## 2016-12-28 NOTE — Progress Notes (Addendum)
[facility]ation      Assessment and Plan:  The patient is a 66 year old female with a history of mediastinal adenopathy and biopsy proven sarcoidosis.  Sarcoidosis.  -Repeat CMP, ace today and again in 3 months.  -If stable can wean prednisone further down/off. If unable to wean will eval for possible methotrexate.   Addendum 01/05/17: ACE and calcium level have normalized. Spoke with pt's daughter via telephone to decrease prednisone as we discussed in office, take 1 tab every other day for a week, and then half tab qod for a week then stop. If develops fatigue then go back to previous dose.   Mediastinal lymphadenopathy.  -The patient's mediastinal lymphadenopathy is likely secondary to sarcoidosis, would not need to follow with serial scanning, but will rather treat the symptoms.   Chronic kidney disease.  -The patient is status post right nephrectomy, now with chronic renal failure. -Uncertain. The patient's renal failure is due to comorbid conditions versus sarcoidosis. We will follow her renal function as we treat with prednisone, if her renal function is secondary to sarcoidosis. We would expect to see some improvement in her kidney function.  Insomnia --Currently on melatonin and ativan qhs.  --She ran out of trazodone, asked not to restart as she is already on ativan, and her symptoms appear controlled with this.   Lung nodules. -Seen on CT chest from 03/10/2016; we'll consider repeat now that sarcoidosis improvement is noted, see if these nodules have resolved.   Date: 12/28/2016  MRN# [id number] Jill [facility] Jill Bond  Referring Physician: Dr. Mike Gip.   Jill Bond is a 66 y.o. old female seen in consultation for chief complaint of:    Chief Complaint  Patient presents with  . Follow-up    31mo rov- pt states breathing is up & down. pt reports of non prod cough, sob with exertion & wheezing.    HPI:   The patient follows up  because of a recent diagnosis of sarcoidosis, which was confirmed on supraclavicular lymph node biopsy, she was also noted to have small upper abdominal adenopathy and bibasilar lung nodes, as well as hypercalcemia and a 30 pound weight loss and some drenching night sweats.  She had a history of "renal cell carcinoma" s/p left nephrectomy on 03/15/2006.  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  Baseline creatinine was 0.9 - 1.1 (CrCl 50-63 ml/min).  At her last visit she was was doing much better. she was asked to repeat lab studies in follow-up. Review of her Ace level from 09/08/2016 shows it is down to 72 from a previous of 85. Her calcium is slightly elevated at 10.6, however, this is down from previous of 11.7. In addition, her kidney function had from previous improved. She notes that her breathing is ok, but she has gained about 35 pounds since being on the prednisone, she has continued on 10 mg daily.   She has been taking melatonin as well as her dose of ativan at night which helps with her insomnia. She had been taking trazodone as well but ran out.    **09/08/2016; Ace levelof 72, down from 85 on 03/20/2016. **09/08/2016; calcium level down to 10.6, renal function normalizing. **She underwent ultrasound guided supraclavicular lymph node biopsy on 03/11/2016.  Pathology revealed non-necrotizing granulomatous inflammation with fibrosis.  There was no evidence of malignancy.  GMS-fungal, AFB and Gram stains were negative. **Pathology from the left nephrectomy specimen showed multiple angiomyolipomas without evidence of renal cell carcinoma (  [id number]).  **PFT 12/15/2016; -FVC 76% of predicted, FEV1 is 70% of predicted, ratio was 83%. No bronchodilator was administered. -TLC 95%, RV/TLC ratio is mildly elevated. Residual volume is 127%. -DLCO is mildly reduced at 71%. Flow volume loop appears rechecked it. Overall this test appears consistent with mild  restrictive  lung disease, likely secondary to body habitus.  Medication:    Current Outpatient Prescriptions:  .  amLODipine (NORVASC) 10 MG tablet, Take 10 mg by mouth at bedtime., Disp: , Rfl:  .  calcitonin, salmon, (MIACALCIN/FORTICAL) 200 UNIT/ACT nasal spray, Place 1 spray into alternate nostrils daily., Disp: 3.7 mL, Rfl: 12 .  docusate sodium (COLACE) 100 MG capsule, Take 200 mg by mouth at bedtime., Disp: , Rfl:  .  hydrochlorothiazide (HYDRODIURIL) 25 MG tablet, , Disp: , Rfl:  .  HYDROcodone-acetaminophen (NORCO) 7.5-325 MG tablet, Take 1-2 tablets by mouth every 4 (four) hours as needed for moderate pain., Disp: 20 tablet, Rfl: 0 .  LORazepam (ATIVAN) 0.5 MG tablet, Take 0.5 mg by mouth every 8 (eight) hours as needed for anxiety. , Disp: , Rfl: 3 .  Melatonin 5 MG CAPS, Take 5 mg by mouth at bedtime as needed (For sleep.). , Disp: , Rfl:  .  methocarbamol (ROBAXIN) 500 MG tablet, Take 1 tablet (500 mg total) by mouth every 6 (six) hours as needed for muscle spasms., Disp: 40 tablet, Rfl: 0 .  methotrexate (RHEUMATREX) 2.5 MG tablet, TAKE 1 TAB WEEKLY FOR 1 WEEK, THEN 2 TAB WEEKLY FOR 1 MONTH, THEN IF TOLERATE, 3 TAB ONCE WEEKLY, Disp: 12 tablet, Rfl: 0 .  metoprolol (TOPROL-XL) 200 MG 24 hr tablet, Take 100 mg by mouth at bedtime. , Disp: , Rfl:  .  Multiple Vitamin (MULTIVITAMIN WITH MINERALS) TABS tablet, Take 1 tablet by mouth daily., Disp: , Rfl:  .  OMEGA-3 FATTY ACIDS PO, Take 2 capsules by mouth daily. , Disp: , Rfl:  .  omeprazole (PRILOSEC) 20 MG capsule, , Disp: , Rfl:  .  ondansetron (ZOFRAN) 4 MG tablet, , Disp: , Rfl:  .  polyethylene glycol (MIRALAX / GLYCOLAX) packet, Take 17 g by mouth daily., Disp: 14 each, Rfl: 0 .  predniSONE (DELTASONE) 10 MG tablet, take 4 tablets daily (40 mg) for one week,  Then take 3 tablets daily  (30 mg) daily for one month,  then 2 tabs (20 mg) daily for one month,  then remain on 1 tablet daily until next office visit., Disp: 210 tablet,  Rfl: 0 .  predniSONE (DELTASONE) 10 MG tablet, Take 1 tablet (10 mg total) by mouth daily with breakfast., Disp: 30 tablet, Rfl: 3 .  prochlorperazine (COMPAZINE) 10 MG tablet, Take 1 tablet (10 mg total) by mouth every 6 (six) hours as needed for nausea or vomiting., Disp: 30 tablet, Rfl: 0 .  senna (SENOKOT) 8.6 MG TABS tablet, Take 1 tablet (8.6 mg total) by mouth daily., Disp: 120 each, Rfl: 0 .  traZODone (DESYREL) 50 MG tablet, Take 50 mg by mouth at bedtime as needed for sleep. , Disp: , Rfl:    Allergies:  Codeine and Penicillins  Review of Systems: Gen:  Denies  fever, sweats, chills HEENT: Denies blurred vision, double vision. bleeds, sore throat Cvc:  No dizziness, chest pain. Resp:   Denies cough or sputum production, shortness of breath Gi: Denies swallowing difficulty, stomach pain. Gu:  Denies bladder incontinence, burning urine Ext:   No Joint pain, stiffness. Skin: No skin rash,  hives  Endoc:  No polyuria, polydipsia. Psych: No depression, insomnia. Other:  All other systems were reviewed with the patient and were negative other that what is mentioned in the HPI.   Physical Examination:   VS: BP 132/88 (BP Location: Left Arm, Cuff Size: Normal)   Pulse 71   Ht 5\' 2"  (1.575 m)   Wt 236 lb (107 kg)   SpO2 98%   BMI 43.16 kg/m   General Appearance: No distress  Neuro:without focal findings,  speech normal,  HEENT: PERRLA, EOM intact.   Pulmonary: normal breath sounds, No wheezing.  CardiovascularNormal S1,S2.  No m/r/g.   Abdomen: Benign, Soft, non-tender. Renal:  No costovertebral tenderness  GU:  No performed at this time. Endoc: No evident thyromegaly, no signs of acromegaly. Skin:   warm, no rashes, no ecchymosis  Extremities: normal, no cyanosis, clubbing.  Other findings:    LABORATORY PANEL:   CBC No results for input(s): WBC, HGB, HCT, PLT in the last 168  hours. ------------------------------------------------------------------------------------------------------------------  Chemistries  No results for input(s): NA, K, CL, CO2, GLUCOSE, BUN, CREATININE, CALCIUM, MG, AST, ALT, ALKPHOS, BILITOT in the last 168 hours.  Invalid input(s): GFRCGP ------------------------------------------------------------------------------------------------------------------  Cardiac Enzymes No results for input(s): TROPONINI in the last 168 hours. ------------------------------------------------------------  RADIOLOGY:  No results found.     Thank  you for the consultation and for allowing [facility] to assist in the care of your patient. Our recommendations are noted above.  Please contact us if we can be of further service.   Marda Stalker, MD.  Board Certified in Internal Medicine, Pulmonary Medicine, [place], and Sleep Medicine.  [facility] Office Number: [phone]  Patricia Pesa, M.D.  Vilinda Boehringer, M.D.  Merton Border, M.D  12/28/2016

## 2017-01-04 ENCOUNTER — Ambulatory Visit (INDEPENDENT_AMBULATORY_CARE_PROVIDER_SITE_OTHER): Payer: Medicare HMO | Admitting: Internal Medicine

## 2017-01-04 ENCOUNTER — Other Ambulatory Visit
Admission: RE | Admit: 2017-01-04 | Discharge: 2017-01-04 | Disposition: A | Discharge status: Home / Self Care | Payer: Medicare HMO | Source: Ambulatory Visit | Attending: Internal Medicine | Admitting: Internal Medicine

## 2017-01-04 ENCOUNTER — Encounter: Payer: Self-pay | Admitting: Internal Medicine

## 2017-01-04 VITALS — BP 132/88 | HR 71 | Ht 62.0 in | Wt 236.0 lb

## 2017-01-04 DIAGNOSIS — N189 Chronic kidney disease, unspecified: Secondary | ICD-10-CM | POA: Diagnosis not present

## 2017-01-04 DIAGNOSIS — D869 Sarcoidosis, unspecified: Secondary | ICD-10-CM

## 2017-01-04 LAB — COMPREHENSIVE METABOLIC PANEL
ALBUMIN: 3.7 g/dL (ref 3.5–5.0)
ALK PHOS: 91 U/L (ref 38–126)
ALT: 38 U/L (ref 14–54)
ANION GAP: 7 (ref 5–15)
AST: 29 U/L (ref 15–41)
BUN: 17 mg/dL (ref 6–20)
CALCIUM: 9.7 mg/dL (ref 8.9–10.3)
CHLORIDE: 103 mmol/L (ref 101–111)
CO2: 32 mmol/L (ref 22–32)
Creatinine, Ser: 0.97 mg/dL (ref 0.44–1.00)
GFR calc Af Amer: >60 mL/min (ref >60)
GFR calc non Af Amer: 60 mL/min — ABNORMAL LOW (ref >60)
GLUCOSE: 135 mg/dL — AB (ref 65–99)
Potassium: 3.7 mmol/L (ref 3.5–5.1)
SODIUM: 142 mmol/L (ref 135–145)
Total Bilirubin: 0.9 mg/dL (ref 0.3–1.2)
Total Protein: 6.7 g/dL (ref 6.5–8.1)

## 2017-01-04 NOTE — Patient Instructions (Signed)
If lab work look normalized (will call you) can decrease to 1 10mg  tab every other day for a week, then half a tab every other day for a week then stop.  --Repeat Lab work in 3 months and then follow up at that time.

## 2017-01-05 DIAGNOSIS — M7662 Achilles tendinitis, left leg: Secondary | ICD-10-CM | POA: Diagnosis not present

## 2017-01-05 LAB — ANGIOTENSIN CONVERTING ENZYME: ANGIOTENSIN-CONVERTING ENZYME: 43 U/L (ref 14–82)

## 2017-01-26 DIAGNOSIS — M79671 Pain in right foot: Secondary | ICD-10-CM | POA: Diagnosis not present

## 2017-01-26 DIAGNOSIS — M7662 Achilles tendinitis, left leg: Secondary | ICD-10-CM | POA: Diagnosis not present

## 2017-02-18 DIAGNOSIS — Z5181 Encounter for therapeutic drug level monitoring: Secondary | ICD-10-CM | POA: Diagnosis not present

## 2017-02-18 DIAGNOSIS — Z0000 Encounter for general adult medical examination without abnormal findings: Secondary | ICD-10-CM | POA: Diagnosis not present

## 2017-02-18 DIAGNOSIS — Z6841 Body Mass Index (BMI) 40.0 and over, adult: Secondary | ICD-10-CM | POA: Diagnosis not present

## 2017-02-18 DIAGNOSIS — E781 Pure hyperglyceridemia: Secondary | ICD-10-CM | POA: Diagnosis not present

## 2017-02-18 DIAGNOSIS — I10 Essential (primary) hypertension: Secondary | ICD-10-CM | POA: Diagnosis not present

## 2017-02-18 DIAGNOSIS — D869 Sarcoidosis, unspecified: Secondary | ICD-10-CM | POA: Diagnosis not present

## 2017-02-18 DIAGNOSIS — Z23 Encounter for immunization: Secondary | ICD-10-CM | POA: Diagnosis not present

## 2017-02-18 DIAGNOSIS — E7800 Pure hypercholesterolemia, unspecified: Secondary | ICD-10-CM | POA: Diagnosis not present

## 2017-04-08 NOTE — Progress Notes (Addendum)
[facility]      Assessment and Plan:  The patient is a 66 year old female with a history of mediastinal adenopathy and biopsy proven sarcoidosis.  Sarcoidosis.  -Repeat CMP, TB QuantiFERON. -If sarcoidosis recurs will eval for possible methotrexate.   Mediastinal lymphadenopathy.  -The patient's mediastinal lymphadenopathy is likely secondary to sarcoidosis, would not need to follow with serial scanning, but will rather treat the symptoms.   Chronic kidney disease.  -The patient is status post right nephrectomy, now with chronic renal failure. -Uncertain. The patient's renal failure is due to comorbid conditions versus sarcoidosis versus hypercalcemia. This appears to have improved as her sarcoidosis has improved.   Insomnia --Currently on melatonin and ativan qhs.  --Asked to stop taking trazodone.  Lung nodules. -Seen on CT chest from 03/10/2016; we'll consider repeat now that sarcoidosis improvement is noted, see if these nodules have resolved.   Orders Placed This Encounter  Procedures  . Comp Met (CMET)  . Angiotensin converting enzyme  . QuantiFERON-TB Gold Plus   Return in about 3 months (around 07/08/2017).  Addendum 05/12/16;  Repeat ACE, calcium, renal function stable. Will repeat in 2 months before next visit.   Date: 04/08/2017  MRN# [id number] Jill Bond 11-02-1950   Jill Bond is a 66 y.o. old female seen in consultation for chief complaint of:    Chief Complaint  Patient presents with  . Sarcoidosis    Pt states since weather is cooler her breathing has been much better.    HPI:  Patient is a 66 year old female with sarcoidosis confirmed on supraclavicular lymph node biopsy.  She was also noted to have small upper abdominal adenopathy and bibasilar lung nodules as well as hypercalcemia and a 30 pound weight loss and drenching night sweats.She had a history of "renal cell carcinoma" s/p left nephrectomy on  03/15/2006.  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  Baseline creatinine was 0.9 - 1.1 (CrCl 50-63 ml/min).  At her last visit she was was doing much better, repeat labs showed that her ACE and hypercalcemia have normalized.  At last visit she was therefore asked to wean down/off of prednisone, which was problematic for her as she had gained 35 pounds on prednisone. She is now off prednisone and is feeling well. Her breathing feels that it is back to normal.   She has been taking melatonin as well as her dose of ativan at night which helps with her insomnia. She had been taking trazodone as well but ran out. She used to work 3rd shift for 9 years. She notes that her bedtimes are highly variable, and she usually only gets 4 hours of sleep.  She takes ativan 0.5 mg at night. She was asked to stop trazodone 50 mg but she is still taking it.    **09/08/2016; Ace levelof 72, down from 85 on 03/20/2016. **09/08/2016; calcium level down to 10.6, renal function normalizing. **She underwent ultrasound guided supraclavicular lymph node biopsy on 03/11/2016.  Pathology revealed non-necrotizing granulomatous inflammation with fibrosis.  There was no evidence of malignancy.  GMS-fungal, AFB and Gram stains were negative. **Pathology from the left nephrectomy specimen showed multiple angiomyolipomas without evidence of renal cell carcinoma ([id number]).  **PFT 12/15/2016; -FVC 76% of predicted, FEV1 is 70% of predicted, ratio was 83%. No bronchodilator was administered. -TLC 95%, RV/TLC ratio is mildly elevated. Residual volume is 127%. -DLCO is mildly reduced at 71%. Flow volume loop appears rechecked it. Overall this test appears consistent  with mild restrictive  lung disease, likely secondary to body habitus.  Medication:    Current Outpatient Medications:  .  amLODipine (NORVASC) 10 MG tablet, Take 10 mg by mouth at bedtime., Disp: , Rfl:  .  calcitonin, salmon,  (MIACALCIN/FORTICAL) 200 UNIT/ACT nasal spray, Place 1 spray into alternate nostrils daily., Disp: 3.7 mL, Rfl: 12 .  docusate sodium (COLACE) 100 MG capsule, Take 200 mg by mouth at bedtime., Disp: , Rfl:  .  hydrochlorothiazide (HYDRODIURIL) 25 MG tablet, , Disp: , Rfl:  .  HYDROcodone-acetaminophen (NORCO) 7.5-325 MG tablet, Take 1-2 tablets by mouth every 4 (four) hours as needed for moderate pain., Disp: 20 tablet, Rfl: 0 .  LORazepam (ATIVAN) 0.5 MG tablet, Take 0.5 mg by mouth every 8 (eight) hours as needed for anxiety. , Disp: , Rfl: 3 .  Melatonin 5 MG CAPS, Take 5 mg by mouth at bedtime as needed (For sleep.). , Disp: , Rfl:  .  methocarbamol (ROBAXIN) 500 MG tablet, Take 1 tablet (500 mg total) by mouth every 6 (six) hours as needed for muscle spasms., Disp: 40 tablet, Rfl: 0 .  methotrexate (RHEUMATREX) 2.5 MG tablet, TAKE 1 TAB WEEKLY FOR 1 WEEK, THEN 2 TAB WEEKLY FOR 1 MONTH, THEN IF TOLERATE, 3 TAB ONCE WEEKLY, Disp: 12 tablet, Rfl: 0 .  metoprolol (TOPROL-XL) 200 MG 24 hr tablet, Take 100 mg by mouth at bedtime. , Disp: , Rfl:  .  Multiple Vitamin (MULTIVITAMIN WITH MINERALS) TABS tablet, Take 1 tablet by mouth daily., Disp: , Rfl:  .  OMEGA-3 FATTY ACIDS PO, Take 2 capsules by mouth daily. , Disp: , Rfl:  .  omeprazole (PRILOSEC) 20 MG capsule, , Disp: , Rfl:  .  ondansetron (ZOFRAN) 4 MG tablet, , Disp: , Rfl:  .  polyethylene glycol (MIRALAX / GLYCOLAX) packet, Take 17 g by mouth daily., Disp: 14 each, Rfl: 0 .  predniSONE (DELTASONE) 10 MG tablet, Take 1 tablet (10 mg total) by mouth daily with breakfast., Disp: 30 tablet, Rfl: 3 .  prochlorperazine (COMPAZINE) 10 MG tablet, Take 1 tablet (10 mg total) by mouth every 6 (six) hours as needed for nausea or vomiting., Disp: 30 tablet, Rfl: 0 .  senna (SENOKOT) 8.6 MG TABS tablet, Take 1 tablet (8.6 mg total) by mouth daily., Disp: 120 each, Rfl: 0 .  traZODone (DESYREL) 50 MG tablet, Take 50 mg by mouth at bedtime as needed for  sleep. , Disp: , Rfl:    Allergies:  Codeine and Penicillins  Review of Systems: Gen:  Denies  fever, sweats, chills HEENT: Denies blurred vision, double vision. bleeds, sore throat Cvc:  No dizziness, chest pain. Resp:   Denies cough or sputum production, shortness of breath Gi: Denies swallowing difficulty, stomach pain. Gu:  Denies bladder incontinence, burning urine Ext:   No Joint pain, stiffness. Skin: No skin rash,  hives  Endoc:  No polyuria, polydipsia. Psych: No depression, insomnia. Other:  All other systems were reviewed with the patient and were negative other that what is mentioned in the HPI.   Physical Examination:   VS: BP (!) 144/88 (BP Location: Left Arm, Cuff Size: Large)   Pulse 80   Resp 16   Ht '5\' 2"'$  (1.575 m)   Wt 228 lb (103.4 kg)   SpO2 98%   BMI 41.70 kg/m   General Appearance: No distress  Neuro:without focal findings,  speech normal,  HEENT: PERRLA, EOM intact.   Pulmonary: normal breath sounds, No wheezing.  CardiovascularNormal S1,S2.  No m/r/g.   Abdomen: Benign, Soft, non-tender. Renal:  No costovertebral tenderness  GU:  No performed at this time. Endoc: No evident thyromegaly, no signs of acromegaly. Skin:   warm, no rashes, no ecchymosis  Extremities: normal, no cyanosis, clubbing.  Other findings:    LABORATORY PANEL:   CBC No results for input(s): WBC, HGB, HCT, PLT in the last 168 hours. ------------------------------------------------------------------------------------------------------------------  Chemistries  No results for input(s): NA, K, CL, CO2, GLUCOSE, BUN, CREATININE, CALCIUM, MG, AST, ALT, ALKPHOS, BILITOT in the last 168 hours.  Invalid input(s): GFRCGP ------------------------------------------------------------------------------------------------------------------  Cardiac Enzymes No results for input(s): TROPONINI in the last 168  hours. ------------------------------------------------------------  RADIOLOGY:  No results found.     Thank  you for the consultation and for allowing [facility] to assist in the care of your patient. Our recommendations are noted above.  Please contact us if we can be of further service.   Marda Stalker, MD.  Board Certified in Internal Medicine, Pulmonary Medicine, [place], and Sleep Medicine.  [facility] Office Number: [phone]  Patricia Pesa, M.D.  Merton Border, M.D  04/08/2017

## 2017-04-09 ENCOUNTER — Other Ambulatory Visit
Admission: RE | Admit: 2017-04-09 | Discharge: 2017-04-09 | Disposition: A | Discharge status: Home / Self Care | Payer: Medicare HMO | Source: Ambulatory Visit | Attending: Internal Medicine | Admitting: Internal Medicine

## 2017-04-09 ENCOUNTER — Ambulatory Visit: Payer: Medicare HMO | Admitting: Internal Medicine

## 2017-04-09 ENCOUNTER — Encounter: Payer: Self-pay | Admitting: Internal Medicine

## 2017-04-09 DIAGNOSIS — R911 Solitary pulmonary nodule: Secondary | ICD-10-CM

## 2017-04-09 DIAGNOSIS — N189 Chronic kidney disease, unspecified: Secondary | ICD-10-CM

## 2017-04-09 DIAGNOSIS — D869 Sarcoidosis, unspecified: Secondary | ICD-10-CM | POA: Diagnosis not present

## 2017-04-09 LAB — CBC WITH DIFFERENTIAL/PLATELET
Basophils Absolute: 0 10*3/uL (ref 0–0.1)
Basophils Relative: 1 %
Eosinophils Absolute: 0.4 10*3/uL (ref 0–0.7)
Eosinophils Relative: 7 %
HEMATOCRIT: 45.5 % (ref 35.0–47.0)
HEMOGLOBIN: 14.8 g/dL (ref 12.0–16.0)
LYMPHS ABS: 0.9 10*3/uL — AB (ref 1.0–3.6)
LYMPHS PCT: 14 %
MCH: 28.8 pg (ref 26.0–34.0)
MCHC: 32.5 g/dL (ref 32.0–36.0)
MCV: 88.6 fL (ref 80.0–100.0)
MONOS PCT: 14 %
Monocytes Absolute: 0.9 10*3/uL (ref 0.2–0.9)
NEUTROS ABS: 4.1 10*3/uL (ref 1.4–6.5)
NEUTROS PCT: 64 %
Platelets: 187 10*3/uL (ref 150–440)
RBC: 5.14 MIL/uL (ref 3.80–5.20)
RDW: 14.4 % (ref 11.5–14.5)
WBC: 6.3 10*3/uL (ref 3.6–11.0)

## 2017-04-09 LAB — COMPREHENSIVE METABOLIC PANEL
ALT: 61 U/L — ABNORMAL HIGH (ref 14–54)
AST: 55 U/L — ABNORMAL HIGH (ref 15–41)
Albumin: 4.2 g/dL (ref 3.5–5.0)
Alkaline Phosphatase: 124 U/L (ref 38–126)
Anion gap: 10 (ref 5–15)
BUN: 22 mg/dL — ABNORMAL HIGH (ref 6–20)
CHLORIDE: 103 mmol/L (ref 101–111)
CO2: 29 mmol/L (ref 22–32)
Calcium: 10.5 mg/dL — ABNORMAL HIGH (ref 8.9–10.3)
Creatinine, Ser: 0.92 mg/dL (ref 0.44–1.00)
Glucose, Bld: 119 mg/dL — ABNORMAL HIGH (ref 65–99)
POTASSIUM: 3.6 mmol/L (ref 3.5–5.1)
SODIUM: 142 mmol/L (ref 135–145)
Total Bilirubin: 0.9 mg/dL (ref 0.3–1.2)
Total Protein: 7.6 g/dL (ref 6.5–8.1)

## 2017-04-09 NOTE — Patient Instructions (Addendum)
My dog used to chase people on a bike a lot. It got so bad, finally I had to take his bike away.  --Stop trazodone, continue the ativan at night.

## 2017-04-10 LAB — ANGIOTENSIN CONVERTING ENZYME: ANGIOTENSIN-CONVERTING ENZYME: 86 U/L — AB (ref 14–82)

## 2017-04-12 ENCOUNTER — Other Ambulatory Visit: Payer: Self-pay | Admitting: Internal Medicine

## 2017-04-12 LAB — QUANTIFERON-TB GOLD PLUS (RQFGPL)
QUANTIFERON MITOGEN VALUE: 5.8 [IU]/mL
QUANTIFERON TB1 AG VALUE: 0.08 [IU]/mL
QuantiFERON Nil Value: 0.08 IU/mL
QuantiFERON TB2 Ag Value: 0.08 IU/mL

## 2017-04-12 LAB — QUANTIFERON-TB GOLD PLUS: QuantiFERON-TB Gold Plus: NEGATIVE

## 2017-04-12 MED ORDER — PREDNISONE 10 MG PO TABS: 10.0000 mg | ORAL_TABLET | Freq: Every day | ORAL | 1 refills | Status: DC

## 2017-04-13 ENCOUNTER — Telehealth: Payer: Self-pay | Admitting: Internal Medicine

## 2017-04-13 DIAGNOSIS — D869 Sarcoidosis, unspecified: Secondary | ICD-10-CM

## 2017-04-13 NOTE — Telephone Encounter (Signed)
Spoke with daughter about information DR discussed with pt yesterday and about medications. Also scheduled appt for 6 weeks and informed daughter pt needs to go and get blood work week before. She verbalized understanding. Nothing further needed.

## 2017-04-13 NOTE — Telephone Encounter (Signed)
Please call regarding prednisone rx.

## 2017-05-07 ENCOUNTER — Other Ambulatory Visit
Admission: RE | Admit: 2017-05-07 | Discharge: 2017-05-07 | Disposition: A | Discharge status: Home / Self Care | Payer: Medicare HMO | Source: Ambulatory Visit | Attending: Internal Medicine | Admitting: Internal Medicine

## 2017-05-07 DIAGNOSIS — D869 Sarcoidosis, unspecified: Secondary | ICD-10-CM | POA: Insufficient documentation

## 2017-05-07 LAB — COMPREHENSIVE METABOLIC PANEL
ALK PHOS: 109 U/L (ref 38–126)
ALT: 38 U/L (ref 14–54)
AST: 29 U/L (ref 15–41)
Albumin: 3.9 g/dL (ref 3.5–5.0)
Anion gap: 6 (ref 5–15)
BUN: 21 mg/dL — AB (ref 6–20)
CALCIUM: 9.4 mg/dL (ref 8.9–10.3)
CO2: 30 mmol/L (ref 22–32)
CREATININE: 1.03 mg/dL — AB (ref 0.44–1.00)
Chloride: 104 mmol/L (ref 101–111)
GFR calc Af Amer: >60 mL/min (ref >60)
GFR, EST NON AFRICAN AMERICAN: 56 mL/min — AB (ref >60)
GLUCOSE: 99 mg/dL (ref 65–99)
Potassium: 3.6 mmol/L (ref 3.5–5.1)
Sodium: 140 mmol/L (ref 135–145)
TOTAL PROTEIN: 6.8 g/dL (ref 6.5–8.1)
Total Bilirubin: 0.9 mg/dL (ref 0.3–1.2)

## 2017-05-08 LAB — ANGIOTENSIN CONVERTING ENZYME: Angiotensin-Converting Enzyme: 51 U/L (ref 14–82)

## 2017-05-12 NOTE — Addendum Note (Signed)
Addended by: Laverle Hobby on: 05/12/2017 04:58 AM   Modules accepted: Orders

## 2017-05-14 ENCOUNTER — Telehealth: Payer: Self-pay | Admitting: Internal Medicine

## 2017-05-14 NOTE — Telephone Encounter (Signed)
Pt daughter called and ask if pt needs appt for 1/8. States based on labwork on 12/28

## 2017-05-17 NOTE — Telephone Encounter (Signed)
Only if she has issues/concerns, otherwise she is supposed to follow up 3 mo from last visit. She also has repeat bloodwork to repeat before that appt.

## 2017-05-17 NOTE — Addendum Note (Signed)
Addended by: Laverle Hobby on: 05/17/2017 08:25 AM   Modules accepted: Orders

## 2017-05-18 ENCOUNTER — Ambulatory Visit: Payer: Medicare HMO | Admitting: Internal Medicine

## 2017-05-18 VITALS — BP 130/90 | HR 89 | Resp 16 | Ht 62.0 in | Wt 235.0 lb

## 2017-05-18 DIAGNOSIS — D869 Sarcoidosis, unspecified: Secondary | ICD-10-CM | POA: Diagnosis not present

## 2017-05-18 MED ORDER — AZITHROMYCIN 250 MG PO TABS: 250.0000 mg | ORAL_TABLET | Freq: Once | ORAL | 0 refills | Status: AC

## 2017-05-18 NOTE — Progress Notes (Signed)
[facility] Consultation      Assessment and Plan:  The patient is a 67 year old female with a history of mediastinal adenopathy and biopsy proven sarcoidosis.  Sarcoidosis.  -Repeat CMP, ACE in 3 months and follow up. --Currently on prednisone at 10 mg daily, stop once you are over your cold symptoms.   -If sarcoidosis recurs will eval for possible methotrexate.   Mediastinal lymphadenopathy.  -The patient's mediastinal lymphadenopathy is likely secondary to sarcoidosis, would not need to follow with serial scanning, but will rather treat the symptoms.   Chronic kidney disease.  -The patient is status post right nephrectomy, now with chronic renal failure. -Uncertain. The patient's renal failure is due to comorbid conditions versus sarcoidosis versus hypercalcemia. This appears to have improved as her sarcoidosis has improved.   Insomnia --Currently on melatonin and ativan qhs, can continue as needed.   Lung nodules. -Seen on CT chest from 03/10/2016; we'll consider repeat now that sarcoidosis improvement is noted, see if these nodules have resolved.   Orders Placed This Encounter  Procedures  . Angiotensin converting enzyme  . Comp Met (CMET)   Return in about 3 months (around 08/16/2017).    Date: 05/18/2017  MRN# [id number] Jill Bond 01/18/1951   Jill Bond is a 67 y.o. old female seen in consultation for chief complaint of:    Chief Complaint  Patient presents with  . Acute Visit    pt here after cancelling appt c/o  cough with brown mucus and wheezing. On Sunday she says her eyes started bothering her. This moring they were matted together.    HPI:  Patient is a 67 year old female with sarcoidosis confirmed on supraclavicular lymph node biopsy.  She was also noted to have small upper abdominal adenopathy and bibasilar lung nodules as well as hypercalcemia and a 30 pound weight loss and drenching night sweats.She had a history of  "renal cell carcinoma" s/p left nephrectomy on 03/15/2006.  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  Baseline creatinine was 0.9 - 1.1 (CrCl 50-63 ml/min).  At her last visit she was was doing much better, repeat labs showed that her ACE and hypercalcemia have normalized.  She has weaned prednisone down to 10 mg daily.  Today she comes in with cough, fatigue, runny nose, sticky eyes.   She takes ativan 0.5 mg at night. She was asked to stop trazodone 50 mg but she is still taking it.   **05/07/17; ACE level 86>>51; other labs normal. **QuantiFERON 04/09/17; negative **09/08/2016; Ace levelof 72, down from 85 on 03/20/2016. **09/08/2016; calcium level down to 10.6, renal function normalizing. **She underwent ultrasound guided supraclavicular lymph node biopsy on 03/11/2016.  Pathology revealed non-necrotizing granulomatous inflammation with fibrosis.  There was no evidence of malignancy.  GMS-fungal, AFB and Gram stains were negative. **Pathology from the left nephrectomy specimen showed multiple angiomyolipomas without evidence of renal cell carcinoma ([id number]).  **PFT 12/15/2016; -FVC 76% of predicted, FEV1 is 70% of predicted, ratio was 83%. No bronchodilator was administered. -TLC 95%, RV/TLC ratio is mildly elevated. Residual volume is 127%. -DLCO is mildly reduced at 71%. Flow volume loop appears rechecked it. Overall this test appears consistent with mild restrictive  lung disease, likely secondary to body habitus.  Medication:    Current Outpatient Medications:  .  amLODipine (NORVASC) 10 MG tablet, Take 10 mg by mouth at bedtime., Disp: , Rfl:  .  docusate sodium (COLACE) 100 MG capsule, Take 200 mg by mouth at bedtime.,  Disp: , Rfl:  .  hydrochlorothiazide (HYDRODIURIL) 25 MG tablet, , Disp: , Rfl:  .  HYDROcodone-acetaminophen (NORCO) 7.5-325 MG tablet, Take 1-2 tablets by mouth every 4 (four) hours as needed for moderate pain., Disp: 20  tablet, Rfl: 0 .  LORazepam (ATIVAN) 0.5 MG tablet, Take 0.5 mg by mouth every 8 (eight) hours as needed for anxiety. , Disp: , Rfl: 3 .  Melatonin 5 MG CAPS, Take 5 mg by mouth at bedtime as needed (For sleep.). , Disp: , Rfl:  .  methocarbamol (ROBAXIN) 500 MG tablet, Take 1 tablet (500 mg total) by mouth every 6 (six) hours as needed for muscle spasms., Disp: 40 tablet, Rfl: 0 .  metoprolol (TOPROL-XL) 200 MG 24 hr tablet, Take 100 mg by mouth at bedtime. , Disp: , Rfl:  .  Multiple Vitamin (MULTIVITAMIN WITH MINERALS) TABS tablet, Take 1 tablet by mouth daily., Disp: , Rfl:  .  OMEGA-3 FATTY ACIDS PO, Take 2 capsules by mouth daily. , Disp: , Rfl:  .  omeprazole (PRILOSEC) 20 MG capsule, , Disp: , Rfl:  .  ondansetron (ZOFRAN) 4 MG tablet, , Disp: , Rfl:  .  polyethylene glycol (MIRALAX / GLYCOLAX) packet, Take 17 g by mouth daily., Disp: 14 each, Rfl: 0 .  predniSONE (DELTASONE) 10 MG tablet, Take 1 tablet (10 mg total) by mouth daily with breakfast. (Patient not taking: Reported on 04/09/2017), Disp: 30 tablet, Rfl: 3 .  predniSONE (DELTASONE) 10 MG tablet, Take 1 tablet (10 mg total) by mouth daily with breakfast., Disp: 90 tablet, Rfl: 1 .  prochlorperazine (COMPAZINE) 10 MG tablet, Take 1 tablet (10 mg total) by mouth every 6 (six) hours as needed for nausea or vomiting., Disp: 30 tablet, Rfl: 0 .  senna (SENOKOT) 8.6 MG TABS tablet, Take 1 tablet (8.6 mg total) by mouth daily., Disp: 120 each, Rfl: 0 .  traZODone (DESYREL) 50 MG tablet, Take 50 mg by mouth at bedtime as needed for sleep. , Disp: , Rfl:    Allergies:  Codeine and Penicillins  Review of Systems: Gen:  Denies  fever, sweats, chills HEENT: Denies blurred vision, double vision. bleeds, sore throat Cvc:  No dizziness, chest pain. Resp:   Denies cough or sputum production, shortness of breath Gi: Denies swallowing difficulty, stomach pain. Gu:  Denies bladder incontinence, burning urine Ext:   No Joint pain,  stiffness. Skin: No skin rash,  hives  Endoc:  No polyuria, polydipsia. Psych: No depression, insomnia. Other:  All other systems were reviewed with the patient and were negative other that what is mentioned in the HPI.   Physical Examination:   VS: BP 130/90 (BP Location: Left Arm, Cuff Size: Large)   Pulse 89   Resp 16   Ht 5' 2" (1.575 m)   Wt 235 lb (106.6 kg)   SpO2 99%   BMI 42.98 kg/m   General Appearance: No distress  Neuro:without focal findings,  speech normal,  HEENT: PERRLA, EOM intact.   Pulmonary: normal breath sounds, No wheezing.  CardiovascularNormal S1,S2.  No m/r/g.   Abdomen: Benign, Soft, non-tender. Renal:  No costovertebral tenderness  GU:  No performed at this time. Endoc: No evident thyromegaly, no signs of acromegaly. Skin:   warm, no rashes, no ecchymosis  Extremities: normal, no cyanosis, clubbing.  Other findings:    LABORATORY PANEL:   CBC No results for input(s): WBC, HGB, HCT, PLT in the last 168 hours. ------------------------------------------------------------------------------------------------------------------  Chemistries  No results for input(s):  NA, K, CL, CO2, GLUCOSE, BUN, CREATININE, CALCIUM, MG, AST, ALT, ALKPHOS, BILITOT in the last 168 hours.  Invalid input(s): GFRCGP ------------------------------------------------------------------------------------------------------------------  Cardiac Enzymes No results for input(s): TROPONINI in the last 168 hours. ------------------------------------------------------------  RADIOLOGY:  No results found.     Thank  you for the consultation and for allowing [facility] to assist in the care of your patient. Our recommendations are noted above.  Please contact us if we can be of further service.   Marda Stalker, MD.  Board Certified in Internal Medicine, Pulmonary Medicine, [place], and Sleep Medicine.  [facility] Office Number: [phone]  Patricia Pesa, M.D.  Merton Border, M.D  05/18/2017

## 2017-05-18 NOTE — Patient Instructions (Addendum)
Will prescribe azithromycin course.   --Take OTC cold medicine such as Tylenol PM.   --Stop prednisone once you are over your cold.

## 2017-05-21 DIAGNOSIS — E7800 Pure hypercholesterolemia, unspecified: Secondary | ICD-10-CM | POA: Diagnosis not present

## 2017-05-21 DIAGNOSIS — Z1231 Encounter for screening mammogram for malignant neoplasm of breast: Secondary | ICD-10-CM | POA: Diagnosis not present

## 2017-05-21 DIAGNOSIS — I10 Essential (primary) hypertension: Secondary | ICD-10-CM | POA: Diagnosis not present

## 2017-05-21 DIAGNOSIS — N183 Chronic kidney disease, stage 3 unspecified: Secondary | ICD-10-CM | POA: Insufficient documentation

## 2017-08-16 ENCOUNTER — Other Ambulatory Visit
Admission: RE | Admit: 2017-08-16 | Discharge: 2017-08-16 | Disposition: A | Discharge status: Home / Self Care | Payer: Medicare HMO | Source: Ambulatory Visit | Attending: Internal Medicine | Admitting: Internal Medicine

## 2017-08-16 DIAGNOSIS — D869 Sarcoidosis, unspecified: Secondary | ICD-10-CM | POA: Diagnosis not present

## 2017-08-16 LAB — COMPREHENSIVE METABOLIC PANEL
ALK PHOS: 118 U/L (ref 38–126)
ALT: 40 U/L (ref 14–54)
AST: 39 U/L (ref 15–41)
Albumin: 3.9 g/dL (ref 3.5–5.0)
Anion gap: 6 (ref 5–15)
BUN: 19 mg/dL (ref 6–20)
CALCIUM: 9.8 mg/dL (ref 8.9–10.3)
CO2: 27 mmol/L (ref 22–32)
Chloride: 107 mmol/L (ref 101–111)
Creatinine, Ser: 1.02 mg/dL — ABNORMAL HIGH (ref 0.44–1.00)
GFR calc non Af Amer: 56 mL/min — ABNORMAL LOW (ref >60)
Glucose, Bld: 111 mg/dL — ABNORMAL HIGH (ref 65–99)
Potassium: 3.8 mmol/L (ref 3.5–5.1)
SODIUM: 140 mmol/L (ref 135–145)
Total Bilirubin: 0.9 mg/dL (ref 0.3–1.2)
Total Protein: 7.2 g/dL (ref 6.5–8.1)

## 2017-08-17 ENCOUNTER — Other Ambulatory Visit: Payer: Self-pay | Admitting: Internal Medicine

## 2017-08-17 DIAGNOSIS — M25572 Pain in left ankle and joints of left foot: Secondary | ICD-10-CM | POA: Diagnosis not present

## 2017-08-17 DIAGNOSIS — Z1231 Encounter for screening mammogram for malignant neoplasm of breast: Secondary | ICD-10-CM

## 2017-08-17 DIAGNOSIS — M7662 Achilles tendinitis, left leg: Secondary | ICD-10-CM | POA: Diagnosis not present

## 2017-08-17 LAB — ANGIOTENSIN CONVERTING ENZYME: ANGIOTENSIN-CONVERTING ENZYME: 84 U/L — AB (ref 14–82)

## 2017-08-18 ENCOUNTER — Ambulatory Visit: Payer: Medicare HMO | Admitting: Internal Medicine

## 2017-08-23 NOTE — Progress Notes (Signed)
[name] Consultation      Assessment and Plan:  The patient is a 67 year old female with a history of mediastinal adenopathy and biopsy proven sarcoidosis.  Sarcoidosis.  -Repeat CMP, ACE in 1 month, if numbers continue to go up, or if not improved will start methotrexate.  QuantiFERON negative. --Patient had been weaned off prednisone, but started due to a leg injury.   Mediastinal lymphadenopathy.  -The patient's mediastinal lymphadenopathy is likely secondary to sarcoidosis, would not need to follow with serial scanning, but will rather treat the symptoms.   Chronic kidney disease.  -The patient is status post right nephrectomy, now with chronic renal failure. -Uncertain. The patient's renal failure is due to comorbid conditions versus sarcoidosis versus hypercalcemia. This appears to have improved as her sarcoidosis has improved.   Insomnia and excessive daytime sleepiness. --Currently on melatonin and ativan qhs, can continue as needed.  -Symptoms and signs of obstructive sleep apnea, will send for sleep study.  Lung nodules. -Seen on CT chest from 03/10/2016; we'll consider repeat now that sarcoidosis improvement is noted, see if these nodules have resolved.   Orders Placed This Encounter  Procedures  . Comp Met (CMET)  . Angiotensin converting enzyme  . Home sleep test   Return in about 3 months (around 11/23/2017).    Date: 08/23/2017  MRN# [id number] Jill Bond [birth year]   Jill Bond is a 67 y.o. old female seen in consultation for chief complaint of:    Chief Complaint  Patient presents with  . Follow-up    Sarcoidosis  . Shortness of Breath    mostly with exertion  . Cough    improved    HPI:  Patient is a 67 year old female with sarcoidosis confirmed on supraclavicular lymph node biopsy on 03/11/16.  She was also noted to have small upper abdominal adenopathy and bibasilar lung nodules as well as hypercalcemia and a  30 pound weight loss and drenching night sweats.She had a history of "renal cell carcinoma" s/p left nephrectomy on 03/15/2006.  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  Baseline creatinine was 0.9 - 1.1 (CrCl 50-63 ml/min).  At her last visit she was was doing much better, repeat labs showed that her ACE and hypercalcemia have normalized.  She has weaned prednisone off prednisone.  Today she feels that her breathing and cough are well controlled. She fell out of bed and feels and hurt her left leg, she had a lot of inflammation, and started on prednisone taper for that.    She snores at night, she is sleepy during the day.  She takes ativan 0.5 mg at night. She was asked to stop trazodone 50 mg but she is still taking it.   **08/16/17; ACE level back up to 84, calcium 9.4>>9.8 **05/07/17; ACE level 86>>51; other labs normal. **QuantiFERON 04/09/17; negative **09/08/2016; Ace levelof 72, down from 85 on 03/20/2016. **09/08/2016; calcium level down to 10.6, renal function normalizing. **She underwent ultrasound guided supraclavicular lymph node biopsy on 03/11/2016.  Pathology revealed non-necrotizing granulomatous inflammation with fibrosis.  There was no evidence of malignancy.  GMS-fungal, AFB and Gram stains were negative. **Pathology from the left nephrectomy specimen showed multiple angiomyolipomas without evidence of renal cell carcinoma ([id number]).  **PFT 12/15/2016; -FVC 76% of predicted, FEV1 is 70% of predicted, ratio was 83%. No bronchodilator was administered. -TLC 95%, RV/TLC ratio is mildly elevated. Residual volume is 127%. -DLCO is mildly reduced at 71%. Flow volume loop appears rechecked it. Overall  this test appears consistent with mild restrictive  lung disease, likely secondary to body habitus.  Medication:    Current Outpatient Medications:  .  amLODipine (NORVASC) 10 MG tablet, Take 10 mg by mouth at bedtime., Disp: , Rfl:  .   docusate sodium (COLACE) 100 MG capsule, Take 200 mg by mouth at bedtime., Disp: , Rfl:  .  hydrochlorothiazide (HYDRODIURIL) 25 MG tablet, , Disp: , Rfl:  .  HYDROcodone-acetaminophen (NORCO) 7.5-325 MG tablet, Take 1-2 tablets by mouth every 4 (four) hours as needed for moderate pain., Disp: 20 tablet, Rfl: 0 .  LORazepam (ATIVAN) 0.5 MG tablet, Take 0.5 mg by mouth every 8 (eight) hours as needed for anxiety. , Disp: , Rfl: 3 .  Melatonin 5 MG CAPS, Take 5 mg by mouth at bedtime as needed (For sleep.). , Disp: , Rfl:  .  methocarbamol (ROBAXIN) 500 MG tablet, Take 1 tablet (500 mg total) by mouth every 6 (six) hours as needed for muscle spasms., Disp: 40 tablet, Rfl: 0 .  metoprolol (TOPROL-XL) 200 MG 24 hr tablet, Take 100 mg by mouth at bedtime. , Disp: , Rfl:  .  Multiple Vitamin (MULTIVITAMIN WITH MINERALS) TABS tablet, Take 1 tablet by mouth daily., Disp: , Rfl:  .  OMEGA-3 FATTY ACIDS PO, Take 2 capsules by mouth daily. , Disp: , Rfl:  .  omeprazole (PRILOSEC) 20 MG capsule, , Disp: , Rfl:  .  ondansetron (ZOFRAN) 4 MG tablet, , Disp: , Rfl:  .  polyethylene glycol (MIRALAX / GLYCOLAX) packet, Take 17 g by mouth daily., Disp: 14 each, Rfl: 0 .  predniSONE (DELTASONE) 10 MG tablet, Take 1 tablet (10 mg total) by mouth daily with breakfast., Disp: 90 tablet, Rfl: 1 .  prochlorperazine (COMPAZINE) 10 MG tablet, Take 1 tablet (10 mg total) by mouth every 6 (six) hours as needed for nausea or vomiting., Disp: 30 tablet, Rfl: 0 .  senna (SENOKOT) 8.6 MG TABS tablet, Take 1 tablet (8.6 mg total) by mouth daily., Disp: 120 each, Rfl: 0   Allergies:  Codeine and Penicillins  Review of Systems: Gen:  Denies  fever, sweats, chills HEENT: Denies blurred vision, double vision. bleeds, sore throat Cvc:  No dizziness, chest pain. Resp:   Denies cough or sputum production, shortness of breath Gi: Denies swallowing difficulty, stomach pain. Gu:  Denies bladder incontinence, burning urine Ext:   No  Joint pain, stiffness. Skin: No skin rash,  hives  Endoc:  No polyuria, polydipsia. Psych: No depression, insomnia. Other:  All other systems were reviewed with the patient and were negative other that what is mentioned in the HPI.   Physical Examination:   VS: BP 134/84 (BP Location: Left Arm, Cuff Size: Large)   Pulse 64   Resp 16   Ht _0  (1.575 m)   Wt 236 lb (107 kg)   SpO2 99%   BMI 43.16 kg/m   General Appearance: No distress  Neuro:without focal findings,  speech normal,  HEENT: PERRLA, EOM intact.   Pulmonary: normal breath sounds, No wheezing.  CardiovascularNormal S1,S2.  No m/r/g.   Abdomen: Benign, Soft, non-tender. Renal:  No costovertebral tenderness  GU:  No performed at this time. Endoc: No evident thyromegaly, no signs of acromegaly. Skin:   warm, no rashes, no ecchymosis  Extremities: normal, no cyanosis, clubbing.  Other findings:    LABORATORY PANEL:   CBC No results for input(s): WBC, HGB, HCT, PLT in the last 168 hours. ------------------------------------------------------------------------------------------------------------------  Chemistries  No results for input(s): NA, K, CL, CO2, GLUCOSE, BUN, CREATININE, CALCIUM, MG, AST, ALT, ALKPHOS, BILITOT in the last 168 hours.  Invalid input(s): GFRCGP ------------------------------------------------------------------------------------------------------------------  Cardiac Enzymes No results for input(s): TROPONINI in the last 168 hours. ------------------------------------------------------------  RADIOLOGY:  No results found.     Thank  you for the consultation and for allowing [facility] to assist in the care of your patient. Our recommendations are noted above.  Please contact us if we can be of further service.   Marda Stalker, MD.  Board Certified in Internal Medicine, Pulmonary Medicine, [place], and Sleep Medicine.  [facility] Office Number: [phone]  Patricia Pesa, M.D.  Merton Border, M.D  08/23/2017

## 2017-08-24 ENCOUNTER — Ambulatory Visit: Payer: Medicare HMO | Admitting: Internal Medicine

## 2017-08-24 ENCOUNTER — Encounter: Payer: Self-pay | Admitting: Internal Medicine

## 2017-08-24 VITALS — BP 134/84 | HR 64 | Resp 16 | Ht 62.0 in | Wt 236.0 lb

## 2017-08-24 DIAGNOSIS — G4719 Other hypersomnia: Secondary | ICD-10-CM

## 2017-08-24 DIAGNOSIS — D869 Sarcoidosis, unspecified: Secondary | ICD-10-CM

## 2017-08-24 NOTE — Patient Instructions (Addendum)
Will check blood work in 1 month, might start on methotrexate depending on results of blood work in 1 month.   Will check sleep study.

## 2017-08-31 DIAGNOSIS — M7662 Achilles tendinitis, left leg: Secondary | ICD-10-CM | POA: Diagnosis not present

## 2017-09-07 ENCOUNTER — Ambulatory Visit
Admission: RE | Admit: 2017-09-07 | Discharge: 2017-09-07 | Disposition: A | Discharge status: Home / Self Care | Payer: Medicare HMO | Source: Ambulatory Visit | Attending: Internal Medicine | Admitting: Internal Medicine

## 2017-09-07 DIAGNOSIS — Z1231 Encounter for screening mammogram for malignant neoplasm of breast: Secondary | ICD-10-CM | POA: Insufficient documentation

## 2017-09-23 ENCOUNTER — Other Ambulatory Visit
Admission: RE | Admit: 2017-09-23 | Discharge: 2017-09-23 | Disposition: A | Discharge status: Home / Self Care | Payer: Medicare HMO | Source: Ambulatory Visit | Attending: Internal Medicine | Admitting: Internal Medicine

## 2017-09-23 DIAGNOSIS — D869 Sarcoidosis, unspecified: Secondary | ICD-10-CM | POA: Insufficient documentation

## 2017-09-23 LAB — COMPREHENSIVE METABOLIC PANEL
ALK PHOS: 120 U/L (ref 38–126)
ALT: 42 U/L (ref 14–54)
ANION GAP: 8 (ref 5–15)
AST: 40 U/L (ref 15–41)
Albumin: 3.9 g/dL (ref 3.5–5.0)
BILIRUBIN TOTAL: 1.2 mg/dL (ref 0.3–1.2)
BUN: 21 mg/dL — ABNORMAL HIGH (ref 6–20)
CALCIUM: 9.7 mg/dL (ref 8.9–10.3)
CO2: 28 mmol/L (ref 22–32)
CREATININE: 0.93 mg/dL (ref 0.44–1.00)
Chloride: 104 mmol/L (ref 101–111)
GFR calc non Af Amer: >60 mL/min (ref >60)
Glucose, Bld: 114 mg/dL — ABNORMAL HIGH (ref 65–99)
Potassium: 4 mmol/L (ref 3.5–5.1)
Sodium: 140 mmol/L (ref 135–145)
Total Protein: 7.2 g/dL (ref 6.5–8.1)

## 2017-09-23 NOTE — Addendum Note (Signed)
Addended by: Santiago Bur on: 09/23/2017 09:03 AM   Modules accepted: Orders

## 2017-09-24 LAB — ANGIOTENSIN CONVERTING ENZYME: Angiotensin-Converting Enzyme: 79 U/L (ref 14–82)

## 2017-09-28 ENCOUNTER — Encounter: Payer: Self-pay | Admitting: Internal Medicine

## 2017-09-28 DIAGNOSIS — D869 Sarcoidosis, unspecified: Secondary | ICD-10-CM

## 2017-09-28 DIAGNOSIS — M7662 Achilles tendinitis, left leg: Secondary | ICD-10-CM | POA: Diagnosis not present

## 2017-09-28 DIAGNOSIS — G4733 Obstructive sleep apnea (adult) (pediatric): Secondary | ICD-10-CM | POA: Diagnosis not present

## 2017-09-28 DIAGNOSIS — G4719 Other hypersomnia: Secondary | ICD-10-CM

## 2017-10-01 DIAGNOSIS — G4733 Obstructive sleep apnea (adult) (pediatric): Secondary | ICD-10-CM | POA: Diagnosis not present

## 2017-10-05 ENCOUNTER — Telehealth: Payer: Self-pay | Admitting: *Deleted

## 2017-10-05 DIAGNOSIS — G4733 Obstructive sleep apnea (adult) (pediatric): Secondary | ICD-10-CM

## 2017-10-05 NOTE — Telephone Encounter (Signed)
Called patient to give results of HST. Patient  Jill Bond is on hippa and received results. Orders placed.

## 2017-10-18 ENCOUNTER — Emergency Department
Admission: EM | Admit: 2017-10-18 | Discharge: 2017-10-18 | Disposition: A | Discharge status: Home / Self Care | Payer: Medicare HMO | Attending: Emergency Medicine | Admitting: Emergency Medicine

## 2017-10-18 ENCOUNTER — Emergency Department: Payer: Medicare HMO

## 2017-10-18 ENCOUNTER — Encounter: Payer: Self-pay | Admitting: Emergency Medicine

## 2017-10-18 ENCOUNTER — Other Ambulatory Visit: Payer: Self-pay

## 2017-10-18 DIAGNOSIS — F419 Anxiety disorder, unspecified: Secondary | ICD-10-CM | POA: Insufficient documentation

## 2017-10-18 DIAGNOSIS — N289 Disorder of kidney and ureter, unspecified: Secondary | ICD-10-CM

## 2017-10-18 DIAGNOSIS — Z96653 Presence of artificial knee joint, bilateral: Secondary | ICD-10-CM | POA: Insufficient documentation

## 2017-10-18 DIAGNOSIS — I129 Hypertensive chronic kidney disease with stage 1 through stage 4 chronic kidney disease, or unspecified chronic kidney disease: Secondary | ICD-10-CM | POA: Insufficient documentation

## 2017-10-18 DIAGNOSIS — R8271 Bacteriuria: Secondary | ICD-10-CM | POA: Insufficient documentation

## 2017-10-18 DIAGNOSIS — R51 Headache: Secondary | ICD-10-CM | POA: Diagnosis not present

## 2017-10-18 DIAGNOSIS — Z85528 Personal history of other malignant neoplasm of kidney: Secondary | ICD-10-CM | POA: Insufficient documentation

## 2017-10-18 DIAGNOSIS — N189 Chronic kidney disease, unspecified: Secondary | ICD-10-CM | POA: Insufficient documentation

## 2017-10-18 DIAGNOSIS — F329 Major depressive disorder, single episode, unspecified: Secondary | ICD-10-CM | POA: Insufficient documentation

## 2017-10-18 DIAGNOSIS — I10 Essential (primary) hypertension: Secondary | ICD-10-CM | POA: Diagnosis not present

## 2017-10-18 DIAGNOSIS — R519 Headache, unspecified: Secondary | ICD-10-CM

## 2017-10-18 DIAGNOSIS — N179 Acute kidney failure, unspecified: Secondary | ICD-10-CM | POA: Diagnosis not present

## 2017-10-18 LAB — CBC WITH DIFFERENTIAL/PLATELET
BASOS ABS: 0 10*3/uL (ref 0–0.1)
BASOS PCT: 1 %
EOS ABS: 0.5 10*3/uL (ref 0–0.7)
EOS PCT: 10 %
HCT: 43.1 % (ref 35.0–47.0)
Hemoglobin: 14.4 g/dL (ref 12.0–16.0)
Lymphocytes Relative: 16 %
Lymphs Abs: 0.8 10*3/uL — ABNORMAL LOW (ref 1.0–3.6)
MCH: 29.2 pg (ref 26.0–34.0)
MCHC: 33.4 g/dL (ref 32.0–36.0)
MCV: 87.3 fL (ref 80.0–100.0)
MONO ABS: 0.8 10*3/uL (ref 0.2–0.9)
Monocytes Relative: 16 %
Neutro Abs: 2.8 10*3/uL (ref 1.4–6.5)
Neutrophils Relative %: 57 %
PLATELETS: 166 10*3/uL (ref 150–440)
RBC: 4.93 MIL/uL (ref 3.80–5.20)
RDW: 15.1 % — AB (ref 11.5–14.5)
WBC: 4.9 10*3/uL (ref 3.6–11.0)

## 2017-10-18 LAB — COMPREHENSIVE METABOLIC PANEL
ALBUMIN: 3.9 g/dL (ref 3.5–5.0)
ALT: 46 U/L (ref 14–54)
AST: 46 U/L — AB (ref 15–41)
Alkaline Phosphatase: 134 U/L — ABNORMAL HIGH (ref 38–126)
Anion gap: 9 (ref 5–15)
BUN: 24 mg/dL — AB (ref 6–20)
CHLORIDE: 103 mmol/L (ref 101–111)
CO2: 27 mmol/L (ref 22–32)
Calcium: 10.6 mg/dL — ABNORMAL HIGH (ref 8.9–10.3)
Creatinine, Ser: 1.07 mg/dL — ABNORMAL HIGH (ref 0.44–1.00)
GFR calc Af Amer: >60 mL/min (ref >60)
GFR, EST NON AFRICAN AMERICAN: 53 mL/min — AB (ref >60)
Glucose, Bld: 135 mg/dL — ABNORMAL HIGH (ref 65–99)
POTASSIUM: 3.5 mmol/L (ref 3.5–5.1)
SODIUM: 139 mmol/L (ref 135–145)
Total Bilirubin: 0.8 mg/dL (ref 0.3–1.2)
Total Protein: 7.1 g/dL (ref 6.5–8.1)

## 2017-10-18 LAB — URINALYSIS, COMPLETE (UACMP) WITH MICROSCOPIC
Bilirubin Urine: NEGATIVE
GLUCOSE, UA: NEGATIVE mg/dL
HGB URINE DIPSTICK: NEGATIVE
KETONES UR: NEGATIVE mg/dL
LEUKOCYTES UA: NEGATIVE
Nitrite: NEGATIVE
PROTEIN: NEGATIVE mg/dL
Specific Gravity, Urine: 1.02 (ref 1.005–1.030)
pH: 5 (ref 5.0–8.0)

## 2017-10-18 LAB — TSH: TSH: 0.426 u[IU]/mL (ref 0.350–4.500)

## 2017-10-18 LAB — SEDIMENTATION RATE: SED RATE: 9 mm/h (ref 0–30)

## 2017-10-18 MED ORDER — SODIUM CHLORIDE 0.9 % IV BOLUS
1000.0000 mL | Freq: Once | INTRAVENOUS | Status: AC
  Administered 2017-10-18: 1000 mL via INTRAVENOUS

## 2017-10-18 MED ORDER — METOPROLOL TARTRATE 50 MG PO TABS
100.0000 mg | ORAL_TABLET | Freq: Once | ORAL | Status: AC
  Administered 2017-10-18: 100 mg via ORAL
  Filled 2017-10-18: qty 2

## 2017-10-18 MED ORDER — SULFAMETHOXAZOLE-TRIMETHOPRIM 800-160 MG PO TABS: 1.0000 | ORAL_TABLET | Freq: Two times a day (BID) | ORAL | 0 refills | Status: DC

## 2017-10-18 MED ORDER — SULFAMETHOXAZOLE-TRIMETHOPRIM 800-160 MG PO TABS
1.0000 | ORAL_TABLET | Freq: Once | ORAL | Status: AC
  Administered 2017-10-18: 1 via ORAL
  Filled 2017-10-18: qty 1

## 2017-10-18 MED ORDER — ACETAMINOPHEN 500 MG PO TABS
1000.0000 mg | ORAL_TABLET | Freq: Once | ORAL | Status: AC
  Administered 2017-10-18: 1000 mg via ORAL
  Filled 2017-10-18: qty 2

## 2017-10-18 MED ORDER — AMLODIPINE BESYLATE 5 MG PO TABS
10.0000 mg | ORAL_TABLET | Freq: Once | ORAL | Status: AC
  Administered 2017-10-18: 10 mg via ORAL
  Filled 2017-10-18: qty 2

## 2017-10-18 NOTE — ED Provider Notes (Addendum)
[facility] Emergency Department Provider Note  ____________________________________________  Time seen: Approximately 6:25 PM  I have reviewed the triage vital signs and the nursing notes.   HISTORY  Chief Complaint Headache    HPI Jill Bond is a 67 y.o. female with a history of sarcoid doses, CKD status post left nephrectomy remotely, HTN, presenting with headache.  The patient reports that for the past 3 weeks, she has woken up every single day with a severe frontal headache.  She denies any blurred or double vision, numbness tingling or weakness, or difficulty walking.  She has had elevated blood pressures, no changes in her medications.  She had one night where her pain was so severe she vomited; this resolved.  Every morning and evening she has been taking over-the-counter tablets with acetaminophen, aspirin and caffeine which expired in 2014 and have not been helping.  She has not had any fevers or chills, tick bites, rash, or stiff neck.  She has not had any dysuria although she does note that she does not urinate very frequently.  She and her daughter feel that her short-term memory has also recently been impaired. Of note, she recently failed a sleep study and has been diagnosed with sleep apnea; her appointment to be fitted for CPAP is next week.   Past Medical History:  Diagnosis Date  . Anxiety   . Arthritis   . Chronic kidney disease 2006   Left Nephrectomy  . Complication of anesthesia    PONV  . Constipation   . Depression   . GERD (gastroesophageal reflux disease)   . Headache   . Hypertension   . PONV (postoperative nausea and vomiting)   . Rotator cuff disorder    LEFT  . Shortness of breath dyspnea    with exertion    Patient Active Problem List   Diagnosis Date Noted  . Nausea without vomiting 03/13/2016  . Palliative care encounter   . Goals of care, counseling/discussion   . Acute renal failure ([place]) 03/11/2016  .  Constipation 03/11/2016  . S/P lymph node biopsy 03/11/2016  . Non-caseating granuloma 03/11/2016  . Lung nodules 03/09/2016  . Adenopathy 03/09/2016  . Weight loss 03/09/2016  . Hypercalcemia 03/09/2016  . Obese 10/23/2015  . S/P right TKA 10/22/2015  . Presence of right artificial knee joint 10/22/2015  . Cancer of kidney ([place]) 11/06/2014  . Benign essential hypertension 11/01/2013  . Hypercholesterolemia 11/01/2013  . Spinal stenosis 11/01/2013  . 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 ABDOMINAL HYSTERECTOMY     PARTIAL  . ACHILLES TENDON SURGERY Left 02/22/2015   Procedure: Secondary ACHILLES TENDON REPAIR;  Surgeon: Albertine Patricia, DPM;  Location: [facility] ORS;  Service: Podiatry;  Laterality: Left;  . Bladder tac    . BREAST EXCISIONAL BIOPSY Left 2003   neg  . COLONOSCOPY  2013  . JOINT REPLACEMENT Left 2007   Total Knee Replacement  . OSTECTOMY Left 02/22/2015   Procedure: OSTECTOMY;  Surgeon: Albertine Patricia, DPM;  Location: [facility] ORS;  Service: Podiatry;  Laterality: Left;  . SHOULDER ARTHROSCOPY W/ ROTATOR CUFF REPAIR Left YRS AGO  . TONSILLECTOMY  AGE 65  . TOTAL KNEE ARTHROPLASTY Right 10/22/2015   Procedure: RIGHT TOTAL KNEE ARTHROPLASTY;  Surgeon: Paralee Cancel, MD;  Location: [facility] ORS;  Service: Orthopedics;  Laterality: Right;  . TOTAL KNEE ARTHROPLASTY Right    10/22/2015    Current Outpatient Rx  .  Order #: [id number] Class: Historical Med  . Order #: [id number] Class: Historical Med  . Order #: [id number] Class: Historical Med  . Order #: [id number] Class: Historical Med  . Order #: [id number] Class: Historical Med  . Order #: [id number] Class: Print  . Order #: [id number] Class: Historical Med  . Order #: [id number] Class: Historical Med  . Order #: [id number] Class: Historical Med  . Order #: [id number] Class: Historical Med  . Order #: [id number] Class: Historical Med  . Order #: [id number] Class: Normal  . Order #:  [id number] Class: Normal  . Order #: [id number] Class: Print    Allergies Codeine and Penicillins  Family History  Problem Relation Age of Onset  . Breast cancer Mother 13  . Heart failure Father   . Prostate cancer Father     Social History Social History   Tobacco Use  . Smoking status: Never Smoker  . Smokeless tobacco: Never Used  Substance Use Topics  . Alcohol use: No  . Drug use: No    Review of Systems Constitutional: No fever/chills.  No lightheadedness or syncope.  No diaphoresis. Eyes: No visual changes.  No blurred or double vision.  No recent changes in her glasses prescription. ENT: No sore throat. No congestion or rhinorrhea. Cardiovascular: Denies chest pain. Denies palpitations. Respiratory: Denies shortness of breath.  No cough. Gastrointestinal: No abdominal pain.  One episode of nausea and vomiting, now resolved.  No diarrhea.  No constipation. Genitourinary: Negative for dysuria. Musculoskeletal: Negative for back pain.  Negative for neck pain or stiffness. Skin: Negative for rash. Neurological: Positive for headache.. No focal numbness, tingling or weakness.  No visual changes, speech changes or mental status changes.  No difficulty walking.    ____________________________________________   PHYSICAL EXAM:  VITAL SIGNS: ED Triage Vitals  Enc Vitals Group     BP 10/18/17 1642 (!) 161/97     Pulse Rate 10/18/17 1642 78     Resp 10/18/17 1642 18     Temp 10/18/17 1642 98.4 F (36.9 C)     Temp Source 10/18/17 1642 Oral     SpO2 10/18/17 1642 94 %     Weight 10/18/17 1643 236 lb (107 kg)     Height 10/18/17 1643 5\' 3"  (1.6 m)     Head Circumference --      Peak Flow --      Pain Score 10/18/17 1642 6     Pain Loc --      Pain Edu? --      Excl. in [place]? --     Constitutional: Alert and oriented. Answers questions appropriately.  Chronically ill-appearing and nontoxic. Eyes: Conjunctivae are normal.  EOMI. PERRLA.  No vertical or horizontal  nystagmus.  No photo phobia.  No scleral icterus. Head: Atraumatic. Nose: No congestion/rhinnorhea. Mouth/Throat: Mucous membranes are moist.  Neck: No stridor.  Supple.  No JVD.  No meningismus. Cardiovascular: Normal rate, regular rhythm. No murmurs, rubs or gallops.  Respiratory: Normal respiratory effort.  No accessory muscle use or retractions. Lungs CTAB.  No wheezes, rales or ronchi. Gastrointestinal: Morbidly obese.  Soft, nontender and nondistended.  No guarding or rebound.  No peritoneal signs. Musculoskeletal: No LE edema. No ttp in the calves or palpable cords.  Negative Homan's sign. Neurologic: Alert and oriented 3. Speech is clear.  Face and smile symmetric. Tongue is midline.  EOMI.  PERRLA.  No horizontal or vertical nystagmus.  No pronator drift. 5 out of 5 grip, biceps, triceps, hip flexors, plantar flexion and dorsiflexion. Normal sensation to light touch in  the bilateral upper and lower extremities, and face. Normal heel-to-shin..   Skin:  Skin is warm, dry and intact. No rash noted. Psychiatric: Mood and affect are normal. Speech and behavior are normal.  Normal judgement. ____________________________________________   LABS (all labs ordered are listed, but only abnormal results are displayed)  Labs Reviewed  CBC WITH DIFFERENTIAL/PLATELET - Abnormal; Notable for the following components:      Result Value   RDW 15.1 (*)    Lymphs Abs 0.8 (*)    All other components within normal limits  COMPREHENSIVE METABOLIC PANEL - Abnormal; Notable for the following components:   Glucose, Bld 135 (*)    BUN 24 (*)    Creatinine, Ser 1.07 (*)    Calcium 10.6 (*)    AST 46 (*)    Alkaline Phosphatase 134 (*)    GFR calc non Af Amer 53 (*)    All other components within normal limits  URINALYSIS, COMPLETE (UACMP) WITH MICROSCOPIC - Abnormal; Notable for the following components:   Color, Urine YELLOW (*)    APPearance CLEAR (*)    Bacteria, UA FEW (*)    All other  components within normal limits  URINE CULTURE  SEDIMENTATION RATE  TSH   ____________________________________________  EKG  ED ECG REPORT I, Eula Listen, the attending physician, personally viewed and interpreted this ECG.   Date: 10/18/2017  EKG Time: 1801  Rate: 71  Rhythm: normal sinus rhythm  Axis: normal  Intervals:none  ST&T Change: No STEMI  ____________________________________________  RADIOLOGY  Dg Chest 2 View  Result Date: 10/18/2017 CLINICAL DATA:  Headache for 3 weeks. EXAM: CHEST - 2 VIEW COMPARISON:  Sep 08, 2016 FINDINGS: The heart size and mediastinal contours are within normal limits. Both lungs are clear. The visualized skeletal structures are unremarkable. IMPRESSION: No active cardiopulmonary disease. Electronically Signed   By: Dorise Bullion III M.D   On: 10/18/2017 18:48   Ct Head Wo Contrast  Result Date: 10/18/2017 CLINICAL DATA:  Headache for the past 3 weeks. Confusion and short-term memory loss. EXAM: CT HEAD WITHOUT CONTRAST TECHNIQUE: Contiguous axial images were obtained from the base of the skull through the vertex without intravenous contrast. COMPARISON:  None. FINDINGS: Brain: No evidence of acute infarction, hemorrhage, hydrocephalus, extra-axial collection or mass lesion/mass effect. Two tiny 2 mm round hyperdense, possibly calcified lesions in the subependymal right lateral ventricle adjacent to the caudate head and tail may represent subependymal nodules. Vascular: No hyperdense vessel or unexpected calcification. Skull: Normal. Negative for fracture or focal lesion. Sinuses/Orbits: Frothy secretions within both maxillary sinuses. No acute orbital abnormality. Other: None. IMPRESSION: 1.  No acute intracranial abnormality. Electronically Signed   By: Titus Dubin M.D.   On: 10/18/2017 17:40    ____________________________________________   PROCEDURES  Procedure(s) performed: None  Procedures  Critical Care performed:  No ____________________________________________   INITIAL IMPRESSION / ASSESSMENT AND PLAN / ED COURSE  Pertinent labs & imaging results that were available during my care of the patient were reviewed by me and considered in my medical decision making (see chart for details).  67 y.o. female with multiple chronic comorbidities presenting with headache, short-term memory loss.  Overall, the patient is hypertensive here with blood pressure of 161/97, but otherwise hemodynamically stable and afebrile.  It is possible that the patient has headaches as a symptom of her uncontrolled hypertension.  She also has some mild renal insufficiency today with a creatinine of 1.07 above her baseline of 0.9's, which may  be a result of dehydration, or uncontrolled hypertension.  We will evaluate her for urinary tract infection.  She did have some tenderness when I palpated her bilateral temples, so sed rate has been sent for evaluation of arteritis.  A venous sinus thrombosis is much less likely.  Given her difficulty with short-term memory, dementia is also possible although her sleep disturbance from sleep apnea may also be a cause and I have encouraged her to continue with getting fitted for her CPAP.Marland Kitchen  She has no evidence of an infectious process including meningitis today.  ----------------------------------------- 8:29 PM on 10/18/2017 -----------------------------------------  The patient's work-up in the emergency department has been reassuring.  Her urinalysis does show some bacteriuria without any additional signs or symptoms of UTI; given her recent symptoms, we will treat her with a 5-day course of antibiotics and send a urine culture although the likelihood that the UTI is the cause of her symptoms today is very low.  The patient has been instructed to continue take her blood pressure medications and to follow-up with her primary care physician for any medication adjustments.  Her CT scan does not show a any  acute intracranial process.  Her sedimentation rate is 9; giant cell arteritis is very unlikely.  At this time, the patient is stable for discharge.  Return precautions as well as follow-up instructions were discussed  ____________________________________________  FINAL CLINICAL IMPRESSION(S) / ED DIAGNOSES  Final diagnoses:  Bacteriuria  Acute renal insufficiency  Hypertension, unspecified type  Nonintractable headache, unspecified chronicity pattern, unspecified headache type         NEW MEDICATIONS STARTED DURING THIS VISIT:  New Prescriptions   SULFAMETHOXAZOLE-TRIMETHOPRIM (BACTRIM DS,SEPTRA DS) 800-160 MG TABLET    Take 1 tablet by mouth 2 (two) times daily.      Eula Listen, MD 10/18/17 2030    Eula Listen, MD 10/18/17 2037

## 2017-10-18 NOTE — ED Triage Notes (Signed)
Headache x 3 weeks. Denies injury. Denies use of blood thinners. Per report BP was elevated at [facility] and question if she had not been taking her blood pressure medicine however patient states she has been taking her blood pressure medications.

## 2017-10-18 NOTE — Discharge Instructions (Signed)
Please continue to take all your medications, especially your blood pressure medication, as prescribed.  Please talk to your primary care physician about your blood pressure medications to see if any changes are needed.  Please drink plenty of fluid to stay well-hydrated and to improve your kidney function.  Please have your primary care physician recheck your kidney function in 1 to 2 days.  Today, you did have some bacteria in your urine, so we will treat you with a 5-day course of antibiotics.  Take the entire course of antibiotics even if you are feeling better.  Your primary care physician can follow-up the results of your urine culture.  Return to the emergency department if you develop severe pain, changes in mental status, fever, vomiting, or any other symptoms concerning to you.

## 2017-10-19 ENCOUNTER — Telehealth: Payer: Self-pay | Admitting: Internal Medicine

## 2017-10-19 NOTE — Telephone Encounter (Signed)
Called patient's daughter and made aware of Dr. Enid Derry response. She has been advised to f/u with pcp in regards to bp medication. Nothing further needed.

## 2017-10-19 NOTE — Telephone Encounter (Signed)
Both HCTZ and sarcoidosis together can raise calcium level, so she should stop the HCTZ.

## 2017-10-19 NOTE — Telephone Encounter (Signed)
Patient daughter calling to see if patient was to d/c hctz  Patient was told by someone in hospital possibly Dr. Juanell Fairly to stop taking and daughter wants to clarify  Please call

## 2017-10-20 DIAGNOSIS — I10 Essential (primary) hypertension: Secondary | ICD-10-CM | POA: Diagnosis not present

## 2017-10-21 LAB — URINE CULTURE: Culture: >=100000 — AB

## 2017-10-26 DIAGNOSIS — G4733 Obstructive sleep apnea (adult) (pediatric): Secondary | ICD-10-CM | POA: Diagnosis not present

## 2017-11-23 ENCOUNTER — Ambulatory Visit: Payer: Medicare HMO | Admitting: Internal Medicine

## 2017-11-25 DIAGNOSIS — G4733 Obstructive sleep apnea (adult) (pediatric): Secondary | ICD-10-CM | POA: Diagnosis not present

## 2017-11-28 ENCOUNTER — Encounter: Payer: Self-pay | Admitting: Internal Medicine

## 2017-11-29 DIAGNOSIS — G4733 Obstructive sleep apnea (adult) (pediatric): Secondary | ICD-10-CM | POA: Diagnosis not present

## 2017-11-30 ENCOUNTER — Ambulatory Visit: Payer: Medicare HMO | Admitting: Internal Medicine

## 2017-11-30 ENCOUNTER — Encounter: Payer: Self-pay | Admitting: Internal Medicine

## 2017-11-30 VITALS — BP 132/82 | HR 64 | Resp 16 | Ht 63.0 in | Wt 225.0 lb

## 2017-11-30 DIAGNOSIS — N189 Chronic kidney disease, unspecified: Secondary | ICD-10-CM

## 2017-11-30 DIAGNOSIS — G4733 Obstructive sleep apnea (adult) (pediatric): Secondary | ICD-10-CM | POA: Diagnosis not present

## 2017-11-30 DIAGNOSIS — D869 Sarcoidosis, unspecified: Secondary | ICD-10-CM | POA: Diagnosis not present

## 2017-11-30 MED ORDER — METHOTREXATE 2.5 MG PO TABS: 10.0000 mg | ORAL_TABLET | ORAL | 0 refills | Status: DC

## 2017-11-30 MED ORDER — METHOTREXATE 2.5 MG PO TABS: 10.0000 mg | ORAL_TABLET | ORAL | 1 refills | Status: DC

## 2017-11-30 MED ORDER — METHOCARBAMOL 500 MG PO TABS: 500.0000 mg | ORAL_TABLET | Freq: Four times a day (QID) | ORAL | 1 refills | Status: DC | PRN

## 2017-11-30 NOTE — Patient Instructions (Addendum)
Will start methotrexate, take 2.5 mg pill once weekly, increase by once weekly until you are taking 4 pills (10 mg) weekly.   Use CPAP every night for the whole night.

## 2017-11-30 NOTE — Addendum Note (Signed)
Addended by: Stephanie Coup on: 11/30/2017 11:50 AM   Modules accepted: Orders

## 2017-11-30 NOTE — Addendum Note (Signed)
Addended by: Stephanie Coup on: 11/30/2017 11:52 AM   Modules accepted: Orders

## 2017-11-30 NOTE — Progress Notes (Signed)
[facility] Consultation      Assessment and Plan:  The patient is a 67 year old female with a history of mediastinal adenopathy and biopsy proven sarcoidosis.  Sarcoidosis.  -QuantiFERON negative. --Continued mildly elevated calcium and ACE levels.  Will start methotrexate at 2.5 mg weekly, wean up to 10 mg weekly until next visit. -We will check recheck ACE level and complete metabolic panel in 3 months.   Mediastinal lymphadenopathy.  -The patient's mediastinal lymphadenopathy is likely secondary to sarcoidosis, would not need to follow with serial scanning, but will rather treat the symptoms.   Chronic kidney disease.  -The patient is status post right nephrectomy, now with chronic renal failure. -Uncertain. The patient's renal failure is due to comorbid conditions versus sarcoidosis versus hypercalcemia. This appears to have improved as her sarcoidosis has improved.   Obstructive sleep apnea. --Currently doing well on CPAP, will continue.  Lung nodules. -Seen on CT chest from 03/10/2016; we'll consider repeat now that sarcoidosis improvement is noted, see if these nodules have resolved.   Orders Placed This Encounter  Procedures  . Angiotensin converting enzyme  . Comp Met (CMET)   No follow-ups on file.    Date: 11/30/2017  MRN# [id number] ARIEONNA MEDINE 08/30/1950   Jill Bond is a 67 y.o. old female seen in consultation for chief complaint of:    Chief Complaint  Patient presents with  . Sleep Apnea    cpap compliance visit:pt still adjusting to therapy wears nightly 4-6 hrs.  . Cough    after applying cpap mask. Also, in the mornings.    HPI:  Patient is a 67 year old female with sarcoidosis confirmed on supraclavicular lymph node biopsy on 03/11/16.  She was also noted to have small upper abdominal adenopathy and bibasilar lung nodules as well as hypercalcemia and a 30 pound weight loss and drenching night sweats.She had a  history of "renal cell carcinoma" s/p left nephrectomy on 03/15/2006.  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  Baseline creatinine was 0.9 - 1.1 (CrCl 50-63 ml/min).  At last visit she was sent for a sleep study which was positive and has been started on CPAP. She has used it most night but there was a death in the family and she travelled for a funeral.   She is off prednisone. She denies night sweats, aches, pains, joint pains. Her breathing is doing well, she denies cough.    She snores at night, she is sleepy during the day.  She takes ativan 0.5 mg at night. She was asked to stop trazodone 50 mg but she is still taking it.  **Download data 10/30/2017-11/28/2017, data personally reviewed, usage greater than 4 hours 23/30 days.  Average usage on days used is 7 hours.  Set pressure is 5-15.  Median pressure is 9, 93 percentile pressure is 13, maximum pressure is 14.  Residual AHI is 5.5.  Overall this shows good compliance with good control of obstructive sleep apnea. **Labs 10/18/2017>> calcium 10.6, creatinine 1.07. **HST 09/28/2017>> AHI 18, auto CPAP recommended 5-15. **08/16/17; ACE level back up to 84, calcium 9.4>>9.8 **05/07/17; ACE level 86>>51; other labs normal. **QuantiFERON 04/09/17; negative **09/08/2016; Ace levelof 72, down from 85 on 03/20/2016. **09/08/2016; calcium level down to 10.6, renal function normalizing. **She underwent ultrasound guided supraclavicular lymph node biopsy on 03/11/2016.  Pathology revealed non-necrotizing granulomatous inflammation with fibrosis.  There was no evidence of malignancy.  GMS-fungal, AFB and Gram stains were negative. **Pathology from the left nephrectomy  specimen showed multiple angiomyolipomas without evidence of renal cell carcinoma ([id number]).  **PFT 12/15/2016; -FVC 76% of predicted, FEV1 is 70% of predicted, ratio was 83%. No bronchodilator was administered. -TLC 95%, RV/TLC ratio is mildly elevated.  Residual volume is 127%. -DLCO is mildly reduced at 71%. Flow volume loop appears rechecked it. Overall this test appears consistent with mild restrictive  lung disease, likely secondary to body habitus.  Medication:    Current Outpatient Medications:  .  amLODipine (NORVASC) 10 MG tablet, Take 10 mg by mouth at bedtime., Disp: , Rfl:  .  diclofenac sodium (VOLTAREN) 1 % GEL, Apply 1 application topically daily as needed., Disp: , Rfl:  .  docusate sodium (COLACE) 100 MG capsule, Take 200 mg by mouth at bedtime., Disp: , Rfl:  .  hydrochlorothiazide (HYDRODIURIL) 25 MG tablet, , Disp: , Rfl:  .  methocarbamol (ROBAXIN) 500 MG tablet, Take 1 tablet (500 mg total) by mouth every 6 (six) hours as needed for muscle spasms., Disp: 40 tablet, Rfl: 0 .  metoprolol (TOPROL-XL) 200 MG 24 hr tablet, Take 100 mg by mouth at bedtime. , Disp: , Rfl:  .  Multiple Vitamin (MULTIVITAMIN WITH MINERALS) TABS tablet, Take 0.5 tablets by mouth daily. , Disp: , Rfl:  .  OMEGA-3 FATTY ACIDS PO, Take 2 capsules by mouth daily. , Disp: , Rfl:  .  omeprazole (PRILOSEC) 20 MG capsule, , Disp: , Rfl:  .  ondansetron (ZOFRAN) 4 MG tablet, , Disp: , Rfl:  .  vitamin E 400 UNIT capsule, Take 400 mg by mouth daily., Disp: , Rfl:    Allergies:  Codeine and Penicillins      LABORATORY PANEL:   CBC No results for input(s): WBC, HGB, HCT, PLT in the last 168 hours. ------------------------------------------------------------------------------------------------------------------  Chemistries  No results for input(s): NA, K, CL, CO2, GLUCOSE, BUN, CREATININE, CALCIUM, MG, AST, ALT, ALKPHOS, BILITOT in the last 168 hours.  Invalid input(s): GFRCGP ------------------------------------------------------------------------------------------------------------------  Cardiac Enzymes No results for input(s): TROPONINI in the last 168 hours. ------------------------------------------------------------  RADIOLOGY:  No  results found.     Thank  you for the consultation and for allowing [facility] to assist in the care of your patient. Our recommendations are noted above.  Please contact us if we can be of further service.   Marda Stalker, MD.  Board Certified in Internal Medicine, Pulmonary Medicine, [place], and Sleep Medicine.  [facility] Office Number: [phone]  Patricia Pesa, M.D.  Merton Border, M.D  11/30/2017

## 2017-12-01 DIAGNOSIS — I10 Essential (primary) hypertension: Secondary | ICD-10-CM | POA: Diagnosis not present

## 2017-12-01 DIAGNOSIS — Z0000 Encounter for general adult medical examination without abnormal findings: Secondary | ICD-10-CM | POA: Diagnosis not present

## 2017-12-01 DIAGNOSIS — E7800 Pure hypercholesterolemia, unspecified: Secondary | ICD-10-CM | POA: Diagnosis not present

## 2017-12-01 DIAGNOSIS — M4800 Spinal stenosis, site unspecified: Secondary | ICD-10-CM | POA: Diagnosis not present

## 2017-12-01 DIAGNOSIS — L929 Granulomatous disorder of the skin and subcutaneous tissue, unspecified: Secondary | ICD-10-CM | POA: Diagnosis not present

## 2017-12-01 DIAGNOSIS — N183 Chronic kidney disease, stage 3 (moderate): Secondary | ICD-10-CM | POA: Diagnosis not present

## 2017-12-01 DIAGNOSIS — Z6841 Body Mass Index (BMI) 40.0 and over, adult: Secondary | ICD-10-CM | POA: Diagnosis not present

## 2017-12-26 DIAGNOSIS — G4733 Obstructive sleep apnea (adult) (pediatric): Secondary | ICD-10-CM | POA: Diagnosis not present

## 2018-01-13 ENCOUNTER — Other Ambulatory Visit: Payer: Self-pay | Admitting: Internal Medicine

## 2018-01-26 DIAGNOSIS — G4733 Obstructive sleep apnea (adult) (pediatric): Secondary | ICD-10-CM | POA: Diagnosis not present

## 2018-02-25 DIAGNOSIS — G4733 Obstructive sleep apnea (adult) (pediatric): Secondary | ICD-10-CM | POA: Diagnosis not present

## 2018-03-08 ENCOUNTER — Ambulatory Visit: Payer: Medicare HMO | Admitting: Internal Medicine

## 2018-03-08 ENCOUNTER — Other Ambulatory Visit
Admission: RE | Admit: 2018-03-08 | Discharge: 2018-03-08 | Disposition: A | Discharge status: Home / Self Care | Payer: Medicare HMO | Source: Ambulatory Visit | Attending: Internal Medicine | Admitting: Internal Medicine

## 2018-03-08 DIAGNOSIS — D869 Sarcoidosis, unspecified: Secondary | ICD-10-CM | POA: Diagnosis not present

## 2018-03-08 DIAGNOSIS — G4733 Obstructive sleep apnea (adult) (pediatric): Secondary | ICD-10-CM

## 2018-03-08 DIAGNOSIS — N189 Chronic kidney disease, unspecified: Secondary | ICD-10-CM

## 2018-03-08 LAB — COMPREHENSIVE METABOLIC PANEL
ALT: 45 U/L — ABNORMAL HIGH (ref 0–44)
ANION GAP: 9 (ref 5–15)
AST: 40 U/L (ref 15–41)
Albumin: 3.8 g/dL (ref 3.5–5.0)
Alkaline Phosphatase: 126 U/L (ref 38–126)
BUN: 22 mg/dL (ref 8–23)
CALCIUM: 9.5 mg/dL (ref 8.9–10.3)
CO2: 31 mmol/L (ref 22–32)
Chloride: 104 mmol/L (ref 98–111)
Creatinine, Ser: 1.03 mg/dL — ABNORMAL HIGH (ref 0.44–1.00)
GFR calc Af Amer: >60 mL/min (ref >60)
GFR calc non Af Amer: 55 mL/min — ABNORMAL LOW (ref >60)
GLUCOSE: 112 mg/dL — AB (ref 70–99)
Potassium: 4 mmol/L (ref 3.5–5.1)
Sodium: 144 mmol/L (ref 135–145)
Total Bilirubin: 1 mg/dL (ref 0.3–1.2)
Total Protein: 7 g/dL (ref 6.5–8.1)

## 2018-03-08 NOTE — Addendum Note (Signed)
Addended by: Santiago Bur on: 03/08/2018 09:27 AM   Modules accepted: Orders

## 2018-03-08 NOTE — Addendum Note (Signed)
Addended by: Santiago Bur on: 03/08/2018 09:28 AM   Modules accepted: Orders

## 2018-03-09 LAB — ANGIOTENSIN CONVERTING ENZYME: Angiotensin-Converting Enzyme: 74 U/L (ref 14–82)

## 2018-03-11 DIAGNOSIS — G4733 Obstructive sleep apnea (adult) (pediatric): Secondary | ICD-10-CM | POA: Diagnosis not present

## 2018-03-13 ENCOUNTER — Encounter: Payer: Self-pay | Admitting: Internal Medicine

## 2018-03-13 NOTE — Progress Notes (Deleted)
[facility] Consultation      Assessment and Plan:  The patient is a 67 year old female with a history of mediastinal adenopathy and biopsy proven sarcoidosis.  Sarcoidosis.  -QuantiFERON negative. --Continued mildly elevated calcium and ACE levels.  Will start methotrexate at 2.5 mg weekly, wean up to 10 mg weekly until next visit. -We will check recheck ACE level and complete metabolic panel in 3 months.   Mediastinal lymphadenopathy.  -The patient's mediastinal lymphadenopathy is likely secondary to sarcoidosis, would not need to follow with serial scanning, but will rather treat the symptoms.   Chronic kidney disease.  -The patient is status post right nephrectomy, now with chronic renal failure. -Uncertain. The patient's renal failure is due to comorbid conditions versus sarcoidosis versus hypercalcemia. This appears to have improved as her sarcoidosis has improved.   Obstructive Sleep Apnea.  --Currently doing well on CPAP, will continue.  Lung Nodules.  -Seen on CT chest from 03/10/2016; we'll consider repeat now that sarcoidosis improvement is noted, see if these nodules have resolved.   No orders of the defined types were placed in this encounter.  No follow-ups on file.    Date: 03/13/2018  MRN# [id number] Jill Bond [birth year]   Jill Bond is a 67 y.o. old female seen in consultation for chief complaint of:    No chief complaint on file.   HPI:  Patient is a 67 year old female with sarcoidosis confirmed on supraclavicular lymph node biopsy on 03/11/16.  She was also noted to have small upper abdominal adenopathy and bibasilar lung nodules as well as hypercalcemia and a 30 pound weight loss and drenching night sweats.She had a history of "renal cell carcinoma" s/p left nephrectomy on 03/15/2006.  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  Baseline creatinine was 0.9 - 1.1 (CrCl 50-63 ml/min).  At last  visit she was sent for a sleep study which was positive and has been started on CPAP. She has used it most night but there was a death in the family and she travelled for a funeral.   She is off prednisone. She denies night sweats, aches, pains, joint pains. Her breathing is doing well, she denies cough.   03/08/18 BMP, ACE>> ACE 74, BMP normal.  She snores at night, she is sleepy during the day.  She takes ativan 0.5 mg at night. She was asked to stop trazodone 50 mg but she is still taking it.  **Download data 10/30/2017-11/28/2017, data personally reviewed, usage greater than 4 hours 23/30 days.  Average usage on days used is 7 hours.  Set pressure is 5-15.  Median pressure is 9, 93 percentile pressure is 13, maximum pressure is 14.  Residual AHI is 5.5.  Overall this shows good compliance with good control of obstructive sleep apnea. **Labs 10/18/2017>> calcium 10.6, creatinine 1.07. **HST 09/28/2017>> AHI 18, auto CPAP recommended 5-15. **08/16/17; ACE level back up to 84, calcium 9.4>>9.8 **05/07/17; ACE level 86>>51; other labs normal. **QuantiFERON 04/09/17; negative **09/08/2016; Ace levelof 72, down from 85 on 03/20/2016. **09/08/2016; calcium level down to 10.6, renal function normalizing. **She underwent ultrasound guided supraclavicular lymph node biopsy on 03/11/2016.  Pathology revealed non-necrotizing granulomatous inflammation with fibrosis.  There was no evidence of malignancy.  GMS-fungal, AFB and Gram stains were negative. **Pathology from the left nephrectomy specimen showed multiple angiomyolipomas without evidence of renal cell carcinoma ([id number]).  **PFT 12/15/2016; -FVC 76% of predicted, FEV1 is 70% of predicted, ratio was 83%. No bronchodilator was administered. -TLC  95%, RV/TLC ratio is mildly elevated. Residual volume is 127%. -DLCO is mildly reduced at 71%. Flow volume loop appears rechecked it. Overall this test appears consistent with mild restrictive  lung  disease, likely secondary to body habitus.  Medication:    Current Outpatient Medications:  .  amLODipine (NORVASC) 10 MG tablet, Take 10 mg by mouth at bedtime., Disp: , Rfl:  .  diclofenac sodium (VOLTAREN) 1 % GEL, Apply 1 application topically daily as needed., Disp: , Rfl:  .  docusate sodium (COLACE) 100 MG capsule, Take 200 mg by mouth at bedtime., Disp: , Rfl:  .  hydrochlorothiazide (HYDRODIURIL) 25 MG tablet, , Disp: , Rfl:  .  methocarbamol (ROBAXIN) 500 MG tablet, Take 1 tablet (500 mg total) by mouth every 6 (six) hours as needed for muscle spasms., Disp: 90 tablet, Rfl: 1 .  methotrexate (RHEUMATREX) 2.5 MG tablet, Take 4 tablets (10 mg total) by mouth once a week. Caution:Chemotherapy. Protect from light., Disp: 90 tablet, Rfl: 1 .  metoprolol (TOPROL-XL) 200 MG 24 hr tablet, Take 100 mg by mouth at bedtime. , Disp: , Rfl:  .  Multiple Vitamin (MULTIVITAMIN WITH MINERALS) TABS tablet, Take 0.5 tablets by mouth daily. , Disp: , Rfl:  .  OMEGA-3 FATTY ACIDS PO, Take 2 capsules by mouth daily. , Disp: , Rfl:  .  omeprazole (PRILOSEC) 20 MG capsule, , Disp: , Rfl:  .  ondansetron (ZOFRAN) 4 MG tablet, , Disp: , Rfl:  .  vitamin E 400 UNIT capsule, Take 400 mg by mouth daily., Disp: , Rfl:    Allergies:  Codeine and Penicillins      LABORATORY PANEL:   CBC No results for input(s): WBC, HGB, HCT, PLT in the last 168 hours. ------------------------------------------------------------------------------------------------------------------  Chemistries  Recent Labs  Lab 03/08/18 0943  NA 144  K 4.0  CL 104  CO2 31  GLUCOSE 112*  BUN 22  CREATININE 1.03*  CALCIUM 9.5  AST 40  ALT 45*  ALKPHOS 126  BILITOT 1.0   ------------------------------------------------------------------------------------------------------------------  Cardiac Enzymes No results for input(s): TROPONINI in the last 168  hours. ------------------------------------------------------------  RADIOLOGY:  No results found.     Thank  you for the consultation and for allowing [facility] to assist in the care of your patient. Our recommendations are noted above.  Please contact us if we can be of further service.   Marda Stalker, MD.  Board Certified in Internal Medicine, Pulmonary Medicine, [place], and Sleep Medicine.  [facility] Office Number: [phone]  Patricia Pesa, M.D.  Merton Border, M.D  03/13/2018

## 2018-03-14 ENCOUNTER — Ambulatory Visit: Payer: Medicare HMO | Admitting: Internal Medicine

## 2018-03-14 ENCOUNTER — Encounter: Payer: Self-pay | Admitting: Internal Medicine

## 2018-03-14 VITALS — BP 126/80 | HR 59 | Resp 16 | Ht 63.0 in | Wt 234.0 lb

## 2018-03-14 DIAGNOSIS — D869 Sarcoidosis, unspecified: Secondary | ICD-10-CM | POA: Diagnosis not present

## 2018-03-14 DIAGNOSIS — Z23 Encounter for immunization: Secondary | ICD-10-CM

## 2018-03-14 NOTE — Addendum Note (Signed)
Addended by: Stephanie Coup on: 03/14/2018 10:15 AM   Modules accepted: Orders

## 2018-03-14 NOTE — Patient Instructions (Addendum)
Continuecurrent dose of methotrexate weekly.  Will repeat labs in 3 months and followup after that.   Flu shot and pneumonia shot today.

## 2018-03-14 NOTE — Progress Notes (Signed)
[facility] Consultation      Assessment and Plan:  The patient is a 67 year old female with a history of mediastinal adenopathy and biopsy proven sarcoidosis.  Sarcoidosis.  -QuantiFERON negative. --Currently on methotrexate 10 mg weekly and doing well, weaned off of prednisone.  -We will check repeat ACE level and complete metabolic panel in 3 months. --Flu shot and PPSV 23 today.   Mediastinal lymphadenopathy. -Secondary to sarcoidosis, will continue to monitor.   Chronic kidney disease. -The patient is status post right nephrectomy, now with chronic renal failure. -Some of this may have been due to hypercalcemia from sarcoidosis, this is now better controlled with methotrexate. Will need to monitor renal function while pt is on methotrexate.   Obstructive sleep apnea. --Continue cpap nightly.   Lung nodules. -Seen on CT chest from 03/10/2016; we'll consider repeat now that sarcoidosis improvement is noted, see if these nodules have resolved.   Orders Placed This Encounter  Procedures  . Comp Met (CMET)  . Angiotensin converting enzyme   No follow-ups on file.    Date: 03/14/2018  MRN# [id number] Jill Bond 1950/11/11   Jill Bond is a 67 y.o. old female seen in consultation for chief complaint of:    Chief Complaint  Patient presents with  . Sleep Apnea    pt states she is still having a hard time with cpap. She keeps awakening  . Sarcoidosis    pt has sob with activity.    HPI:  The patient is a 67 year old female with sarcoidosis, confirmed on supraclavicular lymph node biopsy on 03/11/2016.  She was also noted to have small upper abdominal adenopathy, bibasilar lung nodules, as well as hypercalcemia and 30 pound weight loss.  She has a history of "renal cell carcinoma" status post left nephrectomy in 2007, pathology from which revealed multiple angiomyolipomas.  Since her last visit she feels that she is doing well, her  breathing feels well, she has been less sleepy since starting cpap. She has dysnea on exertion doing routine activities in her home. She was previously trazodone and ativan but has been weaned off of them.  She is now completely off of prednisone.     **Review of labs 03/08/2018>> BMP, calcium, ACE, liver functions are all stable. **CPAP download 02/12/2018-03/13/2018>> raw data personally reviewed, uses greater than 4 hours is 30/30 days.  Average usage on days used 7 hours 4 minutes.  Pressure ranges 5-15.  Median pressure 8, 95th percentile pressure 12, maximum pressure 14.  Residual AHI is 4.1.  Overall this shows excellent compliance with CPAP, excellent control of obstructive sleep apnea. **Download data 10/30/2017-11/28/2017, data personally reviewed, usage greater than 4 hours 23/30 days.  Average usage on days used is 7 hours.  Set pressure is 5-15.  Median pressure is 9, 93 percentile pressure is 13, maximum pressure is 14.  Residual AHI is 5.5.  Overall this shows good compliance with good control of obstructive sleep apnea. **Labs 10/18/2017>> calcium 10.6, creatinine 1.07. **HST 09/28/2017>> AHI 18, auto CPAP recommended 5-15. **08/16/17; ACE level back up to 84, calcium 9.4>>9.8 **05/07/17; ACE level 86>>51; other labs normal. **QuantiFERON 04/09/17; negative **09/08/2016; Ace levelof 72, down from 85 on 03/20/2016. **09/08/2016; calcium level down to 10.6, renal function normalizing. **She underwent ultrasound guided supraclavicular lymph node biopsy on 03/11/2016.  Pathology revealed non-necrotizing granulomatous inflammation with fibrosis.  There was no evidence of malignancy.  GMS-fungal, AFB and Gram stains were negative. **Pathology from the left nephrectomy specimen showed  multiple angiomyolipomas without evidence of renal cell carcinoma ([id number]).  **PFT 12/15/2016; -FVC 76% of predicted, FEV1 is 70% of predicted, ratio was 83%. No bronchodilator was administered. -TLC 95%,  RV/TLC ratio is mildly elevated. Residual volume is 127%. -DLCO is mildly reduced at 71%. Flow volume loop appears rechecked it. Overall this test appears consistent with mild restrictive  lung disease, likely secondary to body habitus.  Medication:    Current Outpatient Medications:  .  amLODipine (NORVASC) 10 MG tablet, Take 10 mg by mouth at bedtime., Disp: , Rfl:  .  diclofenac sodium (VOLTAREN) 1 % GEL, Apply 1 application topically daily as needed., Disp: , Rfl:  .  docusate sodium (COLACE) 100 MG capsule, Take 200 mg by mouth at bedtime., Disp: , Rfl:  .  hydrochlorothiazide (HYDRODIURIL) 25 MG tablet, , Disp: , Rfl:  .  methocarbamol (ROBAXIN) 500 MG tablet, Take 1 tablet (500 mg total) by mouth every 6 (six) hours as needed for muscle spasms., Disp: 90 tablet, Rfl: 1 .  methotrexate (RHEUMATREX) 2.5 MG tablet, Take 4 tablets (10 mg total) by mouth once a week. Caution:Chemotherapy. Protect from light., Disp: 90 tablet, Rfl: 1 .  metoprolol (TOPROL-XL) 200 MG 24 hr tablet, Take 100 mg by mouth at bedtime. , Disp: , Rfl:  .  Multiple Vitamin (MULTIVITAMIN WITH MINERALS) TABS tablet, Take 0.5 tablets by mouth daily. , Disp: , Rfl:  .  OMEGA-3 FATTY ACIDS PO, Take 2 capsules by mouth daily. , Disp: , Rfl:  .  omeprazole (PRILOSEC) 20 MG capsule, , Disp: , Rfl:  .  ondansetron (ZOFRAN) 4 MG tablet, , Disp: , Rfl:  .  vitamin E 400 UNIT capsule, Take 400 mg by mouth daily., Disp: , Rfl:    Allergies:  Codeine and Penicillins  Review of Systems:  Constitutional: Feels well. Cardiovascular: Denies chest pain, exertional chest pain.  Pulmonary: Denies hemoptysis, pleuritic chest pain.   The remainder of systems were reviewed and were found to be negative other than what is documented in the HPI.    Physical Examination:   VS: BP 126/80 (BP Location: Left Arm, Cuff Size: Large)   Pulse (!) 59   Resp 16   Ht _0  (1.6 m)   Wt 234 lb (106.1 kg)   SpO2 99%   BMI 41.45 kg/m     General Appearance: No distress  Neuro:without focal findings, mental status, speech normal, alert and oriented HEENT: PERRLA, EOM intact Pulmonary: No wheezing, No rales  CardiovascularNormal S1,S2.  No m/r/g.  Abdomen: Benign, Soft, non-tender, No masses Renal:  No costovertebral tenderness  GU:  No performed at this time. Endoc: No evident thyromegaly, no signs of acromegaly or Cushing features Skin:   warm, no rashes, no ecchymosis  Extremities: normal, no cyanosis, clubbing.      LABORATORY PANEL:   CBC No results for input(s): WBC, HGB, HCT, PLT in the last 168 hours. ------------------------------------------------------------------------------------------------------------------  Chemistries  Recent Labs  Lab 03/08/18 0943  NA 144  K 4.0  CL 104  CO2 31  GLUCOSE 112*  BUN 22  CREATININE 1.03*  CALCIUM 9.5  AST 40  ALT 45*  ALKPHOS 126  BILITOT 1.0   ------------------------------------------------------------------------------------------------------------------  Cardiac Enzymes No results for input(s): TROPONINI in the last 168 hours. ------------------------------------------------------------  RADIOLOGY:  No results found.     Thank  you for the consultation and for allowing [facility] to assist in the care of your patient. Our recommendations are  noted above.  Please contact us if we can be of further service.  Marda Stalker, M.D., F.C.C.P.  Board Certified in Internal Medicine, Pulmonary Medicine, [place], and Sleep Medicine.  [facility] Office Number: [phone]   03/14/2018

## 2018-03-28 DIAGNOSIS — G4733 Obstructive sleep apnea (adult) (pediatric): Secondary | ICD-10-CM | POA: Diagnosis not present

## 2018-04-04 DIAGNOSIS — D869 Sarcoidosis, unspecified: Secondary | ICD-10-CM | POA: Diagnosis not present

## 2018-04-04 DIAGNOSIS — Z6841 Body Mass Index (BMI) 40.0 and over, adult: Secondary | ICD-10-CM | POA: Diagnosis not present

## 2018-04-04 DIAGNOSIS — N183 Chronic kidney disease, stage 3 (moderate): Secondary | ICD-10-CM | POA: Diagnosis not present

## 2018-04-04 DIAGNOSIS — Z0001 Encounter for general adult medical examination with abnormal findings: Secondary | ICD-10-CM | POA: Diagnosis not present

## 2018-04-04 DIAGNOSIS — E7800 Pure hypercholesterolemia, unspecified: Secondary | ICD-10-CM | POA: Diagnosis not present

## 2018-04-04 DIAGNOSIS — G4733 Obstructive sleep apnea (adult) (pediatric): Secondary | ICD-10-CM | POA: Insufficient documentation

## 2018-04-04 DIAGNOSIS — C649 Malignant neoplasm of unspecified kidney, except renal pelvis: Secondary | ICD-10-CM | POA: Diagnosis not present

## 2018-04-04 DIAGNOSIS — I10 Essential (primary) hypertension: Secondary | ICD-10-CM | POA: Diagnosis not present

## 2018-04-27 DIAGNOSIS — G4733 Obstructive sleep apnea (adult) (pediatric): Secondary | ICD-10-CM | POA: Diagnosis not present

## 2018-05-28 DIAGNOSIS — G4733 Obstructive sleep apnea (adult) (pediatric): Secondary | ICD-10-CM | POA: Diagnosis not present

## 2018-06-13 IMAGING — US US BIOPSY
1 series · 13 of 22 positions shown · non-contrast
Comparison: none

INDICATION: Right supraclavicular lymphadenopathy

[Series 1: us biopsy · 0.07mm/px · 13 of 22 slices shown]
[im 1/22]
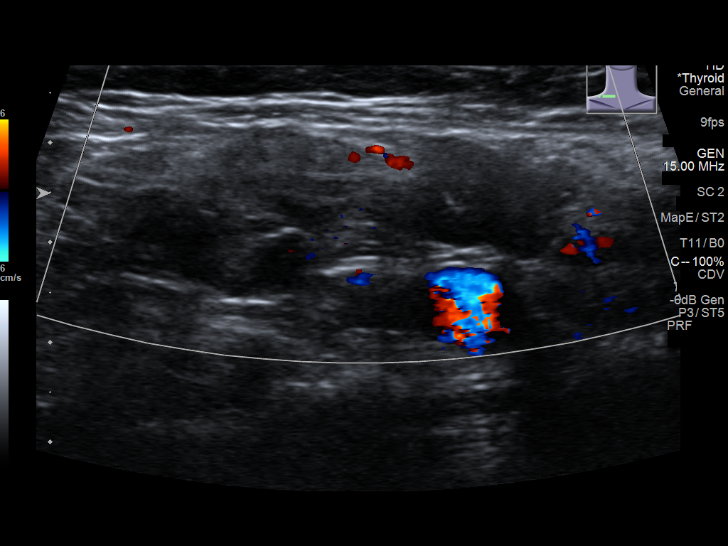
[im 3/22]
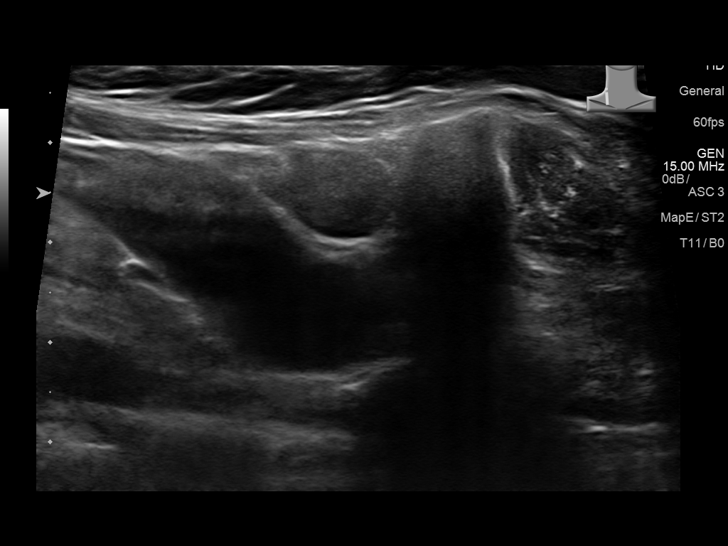
[im 5/22]
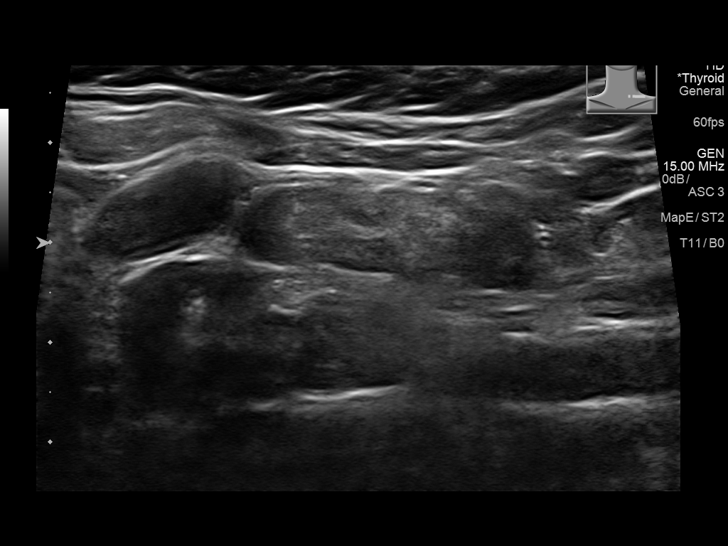
[im 6/22]
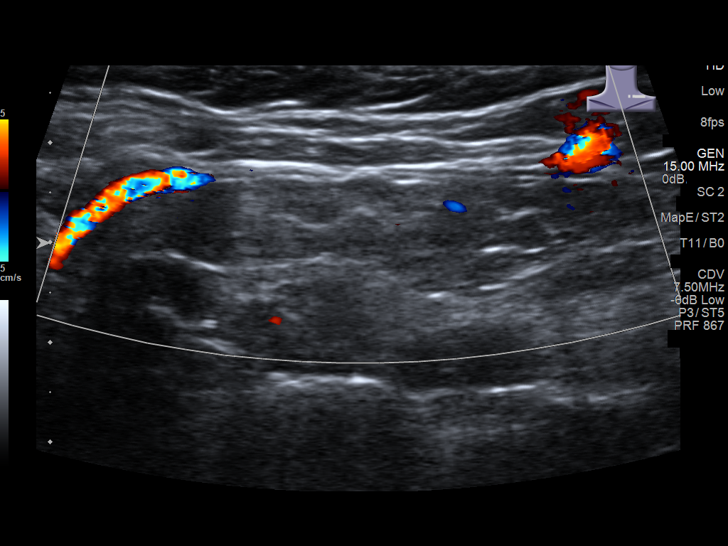
[im 8/22]
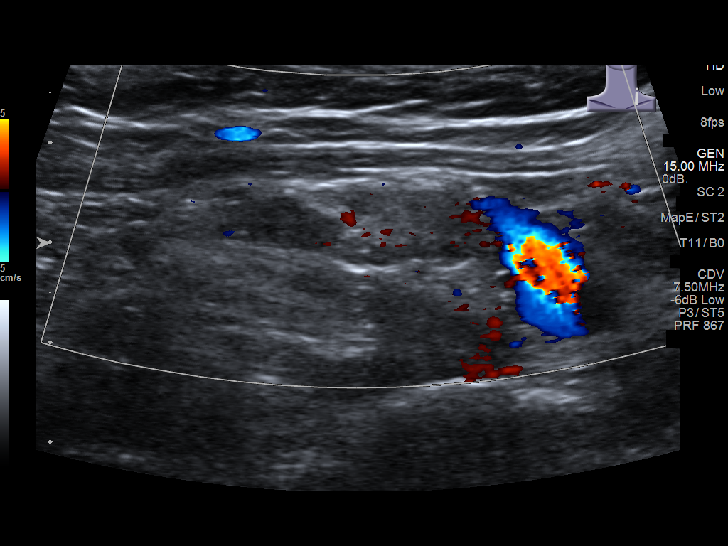
[im 10/22]
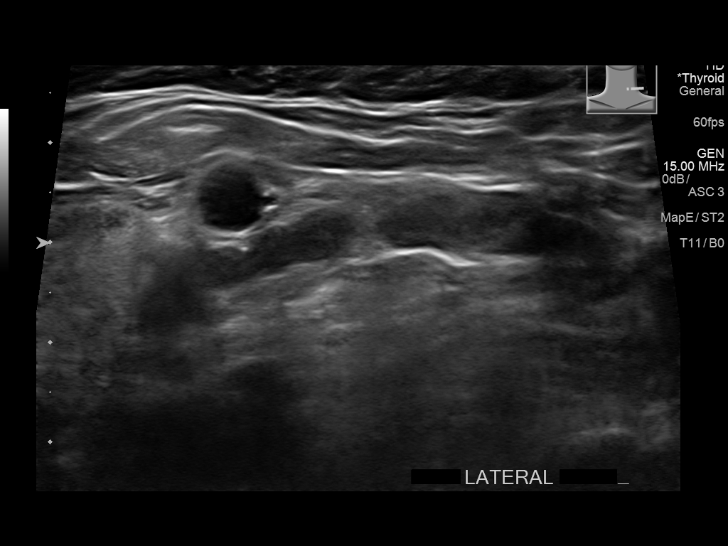
[im 12/22]
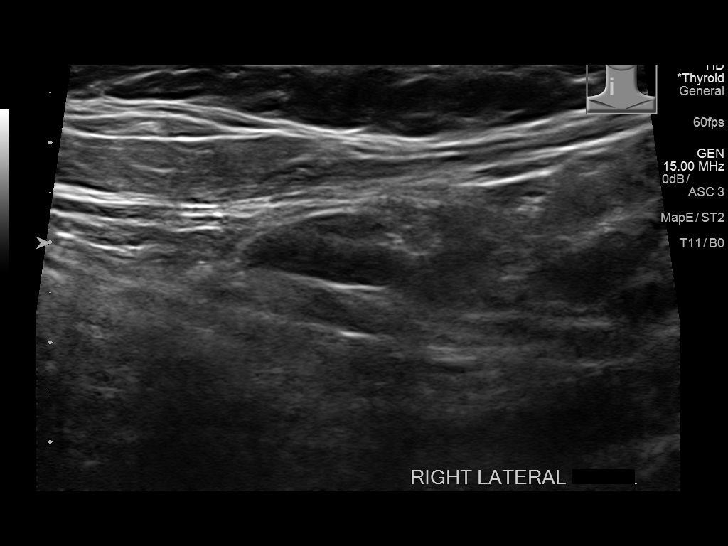
[im 13/22]
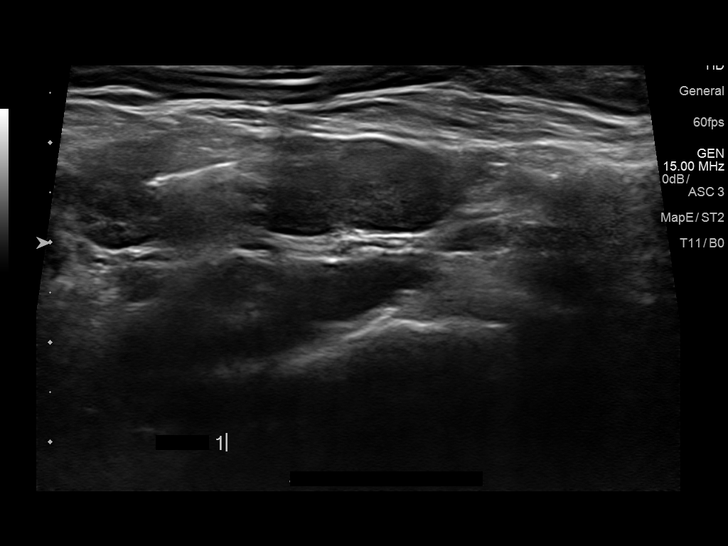
[im 15/22]
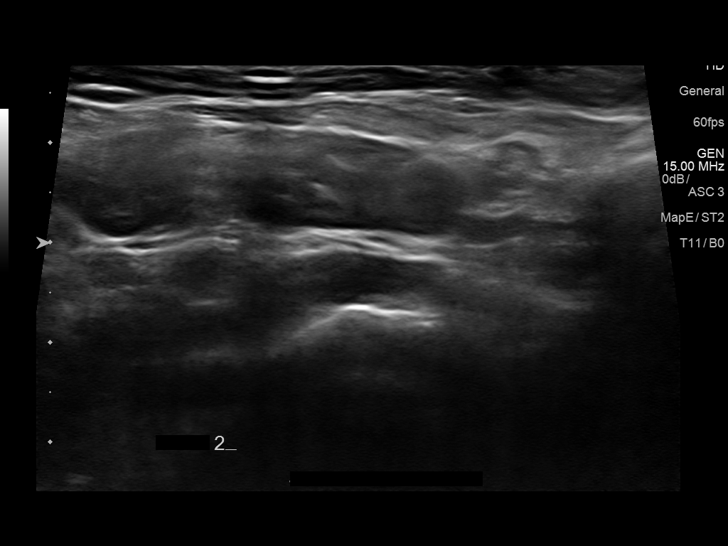
[im 17/22]
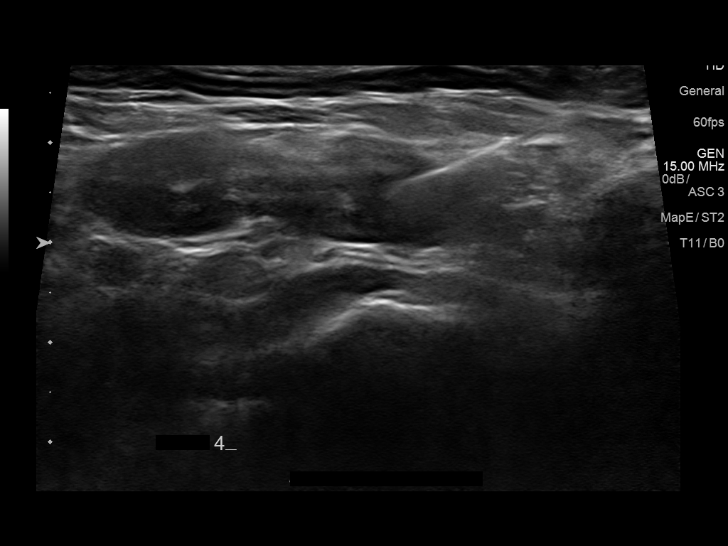
[im 18/22]
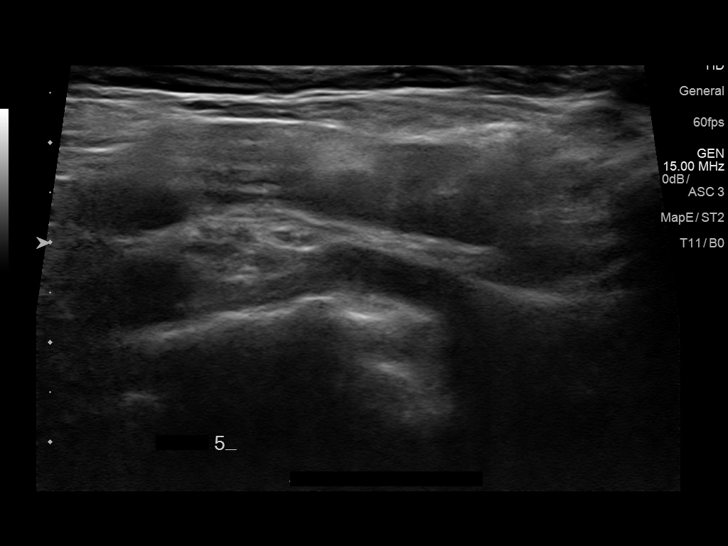
[im 20/22]
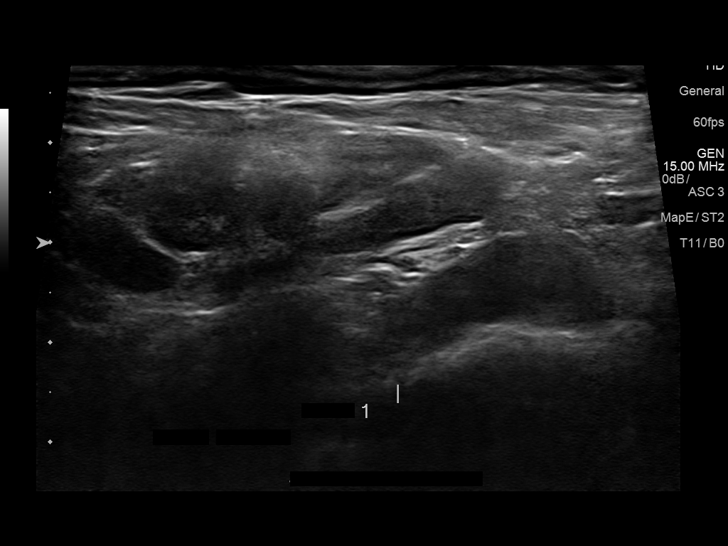
[im 22/22]
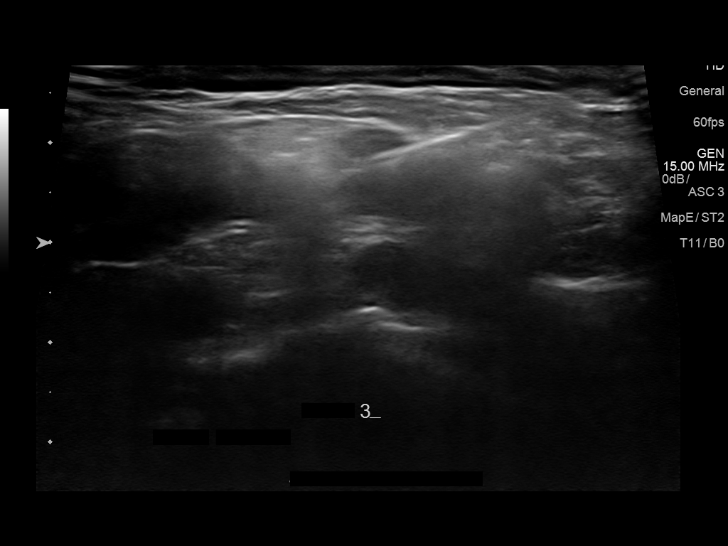

[13 of 22 positions shown; findings below may reference images not displayed]

EXAM:
Ultrasound-guided fine-needle aspiration and subsequent core biopsy
of right supraclavicular lymphadenopathy

MEDICATIONS:
None.

ANESTHESIA/SEDATION:
None

FLUOROSCOPY TIME:  Not applicable

COMPLICATIONS:
None immediate.

PROCEDURE:
Informed written consent was obtained from the patient after a
thorough discussion of the procedural risks, benefits and
alternatives. All questions were addressed. Maximal Sterile Barrier
Technique was utilized including caps, mask, sterile gowns, sterile
gloves, sterile drape, hand hygiene and skin antiseptic. A timeout
was performed prior to the initiation of the procedure.

Utilizing 1% xylocaine as local anesthetic and real-time ultrasound
guidance, 4 passes were performed utilizing 25 gauge spinal needles
for fine-needle aspiration. Hard copy imaging of the guidance was
performed. The samples were deemed insufficient by the
cytotechnologist and for this reason a 17 gauge guiding needle was
placed under real-time ultrasound guidance into the right
supraclavicular lymph node. Two 18 gauge core biopsies were then
obtained and utilized for both touch prep and surgical pathology.
The touch preps yielded a significantly greater degree of diagnostic
tissue. Study was then terminated and the guiding needle removed.
Hemostasis was obtained at the puncture site. The patient tolerated
the procedure well and was returned her room in satisfactory
condition.
IMPRESSION: Initial fine-needle aspiration which was felt to be inadequate
leading to subsequent diagnostic core biopsy of right
supraclavicular lymphadenopathy.

## 2018-06-28 ENCOUNTER — Ambulatory Visit: Payer: Medicare HMO | Admitting: Internal Medicine

## 2018-06-28 ENCOUNTER — Other Ambulatory Visit
Admission: RE | Admit: 2018-06-28 | Discharge: 2018-06-28 | Disposition: A | Discharge status: Home / Self Care | Payer: Medicare HMO | Source: Ambulatory Visit | Attending: Internal Medicine | Admitting: Internal Medicine

## 2018-06-28 DIAGNOSIS — D869 Sarcoidosis, unspecified: Secondary | ICD-10-CM | POA: Diagnosis not present

## 2018-06-28 DIAGNOSIS — G4733 Obstructive sleep apnea (adult) (pediatric): Secondary | ICD-10-CM | POA: Diagnosis not present

## 2018-06-28 LAB — COMPREHENSIVE METABOLIC PANEL
ALK PHOS: 131 U/L — AB (ref 38–126)
ALT: 37 U/L (ref 0–44)
ANION GAP: 3 — AB (ref 5–15)
AST: 34 U/L (ref 15–41)
Albumin: 3.9 g/dL (ref 3.5–5.0)
BILIRUBIN TOTAL: 1 mg/dL (ref 0.3–1.2)
BUN: 19 mg/dL (ref 8–23)
CALCIUM: 9.4 mg/dL (ref 8.9–10.3)
CO2: 31 mmol/L (ref 22–32)
Chloride: 106 mmol/L (ref 98–111)
Creatinine, Ser: 0.9 mg/dL (ref 0.44–1.00)
Glucose, Bld: 95 mg/dL (ref 70–99)
Potassium: 4 mmol/L (ref 3.5–5.1)
SODIUM: 140 mmol/L (ref 135–145)
Total Protein: 7.1 g/dL (ref 6.5–8.1)

## 2018-06-28 NOTE — Addendum Note (Signed)
Addended by: Santiago Bur on: 06/28/2018 11:05 AM   Modules accepted: Orders

## 2018-06-29 LAB — ANGIOTENSIN CONVERTING ENZYME: ANGIOTENSIN-CONVERTING ENZYME: 52 U/L (ref 14–82)

## 2018-07-04 NOTE — Progress Notes (Signed)
[facility] Consultation      Assessment and Plan:  The patient is a 68 year old female with a history of mediastinal adenopathy and biopsy proven sarcoidosis.  Sarcoidosis. -QuantiFERON negative. --Currently on methotrexate 10 mg weekly and doing well, weaned off of prednisone.  -We will check repeat ACE level and complete metabolic panel in 6 months. --Flu shot and PPSV 23-03/14/18.   Mediastinal lymphadenopathy. -Secondary to sarcoidosis, will continue to monitor.   Chronic kidney disease. -The patient is status post right nephrectomy, now with chronic renal failure. -Some of this may have been due to hypercalcemia from sarcoidosis, this is now better controlled with methotrexate. Will need to monitor renal function while pt is on methotrexate.   Obstructive sleep apnea. --Continue cpap nightly.   Lung nodules. -Seen on CT chest from 03/10/2016; suspect that these are related to saroidosis.    Orders Placed This Encounter  Procedures  . DG Chest 2 View  . Comp Met (CMET)  . Angiotensin converting enzyme   Return in about 6 months (around 01/03/2019).    Date: 07/04/2018  MRN# [id number] Jill Bond 1950/09/16   Jill Bond is a 68 y.o. old female seen in consultation for chief complaint of:    Chief Complaint  Patient presents with  . Follow-up    F/U for sarcoid and sleep apnea. States her breathing has been good. Reports her cpap has been working well.     HPI:  The patient is a 68 year old female with sarcoidosis, confirmed on supraclavicular lymph node biopsy on 03/11/2016.  She was also noted to have small upper abdominal adenopathy, bibasilar lung nodules, as well as hypercalcemia and 30 pound weight loss.  She has a history of "renal cell carcinoma" status post left nephrectomy in 2007, pathology from which revealed multiple angiomyolipomas.  She feels that her breathing has been doing well. She has been sleeping well also.  She  remains off of prednisone, she is taking 4 tabs of methotrexate once per week.   **CPAP download 06/04/2018-07/03/2018>> raw data personally reviewed, usage greater than 4 hours is 27/30 days.  Average usage on days used is 6 hours 30 minutes.  Pressure ranges 5-15.  Median pressure 9, maximum pressure 14, 95th percentile pressure 13.  Leaks are within normal limits residual AHI is 4.  Overall this shows good compliance with excellent control of obstructive sleep apnea. **Review of labs 06/28/2018>> ACE level 52, calcium and renal function are stable. **Review of labs 03/08/2018>> BMP, calcium, ACE, liver functions are all stable. **CPAP download 02/12/2018-03/13/2018>> raw data personally reviewed, uses greater than 4 hours is 30/30 days.  Average usage on days used 7 hours 4 minutes.  Pressure ranges 5-15.  Median pressure 8, 95th percentile pressure 12, maximum pressure 14.  Residual AHI is 4.1.  Overall this shows excellent compliance with CPAP, excellent control of obstructive sleep apnea. **Download data 10/30/2017-11/28/2017, data personally reviewed, usage greater than 4 hours 23/30 days.  Average usage on days used is 7 hours.  Set pressure is 5-15.  Median pressure is 9, 93 percentile pressure is 13, maximum pressure is 14.  Residual AHI is 5.5.  Overall this shows good compliance with good control of obstructive sleep apnea. **Labs 10/18/2017>> calcium 10.6, creatinine 1.07. **HST 09/28/2017>> AHI 18, auto CPAP recommended 5-15. **08/16/17; ACE level back up to 84, calcium 9.4>>9.8 **05/07/17; ACE level 86>>51; other labs normal. **QuantiFERON 04/09/17; negative **09/08/2016; Ace levelof 72, down from 85 on 03/20/2016. **09/08/2016; calcium level down  to 10.6, renal function normalizing. **She underwent ultrasound guided supraclavicular lymph node biopsy on 03/11/2016.  Pathology revealed non-necrotizing granulomatous inflammation with fibrosis.  There was no evidence of malignancy.  GMS-fungal, AFB  and Gram stains were negative. **Pathology from the left nephrectomy specimen showed multiple angiomyolipomas without evidence of renal cell carcinoma ([id number]).  **PFT 12/15/2016; -FVC 76% of predicted, FEV1 is 70% of predicted, ratio was 83%. No bronchodilator was administered. -TLC 95%, RV/TLC ratio is mildly elevated. Residual volume is 127%. -DLCO is mildly reduced at 71%. Flow volume loop appears rechecked it. Overall this test appears consistent with mild restrictive  lung disease, likely secondary to body habitus.  Medication:    Current Outpatient Medications:  .  amLODipine (NORVASC) 10 MG tablet, Take 10 mg by mouth at bedtime., Disp: , Rfl:  .  diclofenac sodium (VOLTAREN) 1 % GEL, Apply 1 application topically daily as needed., Disp: , Rfl:  .  docusate sodium (COLACE) 100 MG capsule, Take 200 mg by mouth at bedtime., Disp: , Rfl:  .  hydrochlorothiazide (HYDRODIURIL) 25 MG tablet, , Disp: , Rfl:  .  methocarbamol (ROBAXIN) 500 MG tablet, Take 1 tablet (500 mg total) by mouth every 6 (six) hours as needed for muscle spasms., Disp: 90 tablet, Rfl: 1 .  methotrexate (RHEUMATREX) 2.5 MG tablet, Take 4 tablets (10 mg total) by mouth once a week. Caution:Chemotherapy. Protect from light., Disp: 90 tablet, Rfl: 1 .  metoprolol (TOPROL-XL) 200 MG 24 hr tablet, Take 100 mg by mouth at bedtime. , Disp: , Rfl:  .  Multiple Vitamin (MULTIVITAMIN WITH MINERALS) TABS tablet, Take 0.5 tablets by mouth daily. , Disp: , Rfl:  .  OMEGA-3 FATTY ACIDS PO, Take 2 capsules by mouth daily. , Disp: , Rfl:  .  omeprazole (PRILOSEC) 20 MG capsule, , Disp: , Rfl:  .  ondansetron (ZOFRAN) 4 MG tablet, , Disp: , Rfl:  .  vitamin E 400 UNIT capsule, Take 400 mg by mouth daily., Disp: , Rfl:    Allergies:  Codeine and Penicillins  Review of Systems:  Constitutional: Feels well. Cardiovascular: Denies chest pain, exertional chest pain.  Pulmonary: Denies hemoptysis, pleuritic chest pain.   The  remainder of systems were reviewed and were found to be negative other than what is documented in the HPI.    Physical Examination:   VS: BP 128/84   Pulse 62   Ht _0  (1.6 m)   Wt 233 lb 9.6 oz (106 kg)   SpO2 99%   BMI 41.38 kg/m   General Appearance: No distress  Neuro:without focal findings, mental status, speech normal, alert and oriented HEENT: PERRLA, EOM intact Pulmonary: No wheezing, No rales  CardiovascularNormal S1,S2.  No m/r/g.  Abdomen: Benign, Soft, non-tender, No masses Renal:  No costovertebral tenderness  GU:  No performed at this time. Endoc: No evident thyromegaly, no signs of acromegaly or Cushing features Skin:   warm, no rashes, no ecchymosis  Extremities: normal, no cyanosis, clubbing.      LABORATORY PANEL:   CBC No results for input(s): WBC, HGB, HCT, PLT in the last 168 hours. ------------------------------------------------------------------------------------------------------------------  Chemistries  Recent Labs  Lab 06/28/18 1123  NA 140  K 4.0  CL 106  CO2 31  GLUCOSE 95  BUN 19  CREATININE 0.90  CALCIUM 9.4  AST 34  ALT 37  ALKPHOS 131*  BILITOT 1.0   ------------------------------------------------------------------------------------------------------------------  Cardiac Enzymes No results for input(s): TROPONINI in the last 168 hours. ------------------------------------------------------------  RADIOLOGY:  No results found.     Thank  you for the consultation and for allowing [facility] to assist in the care of your patient. Our recommendations are noted above.  Please contact us if we can be of further service.  Marda Stalker, M.D., F.C.C.P.  Board Certified in Internal Medicine, Pulmonary Medicine, [place], and Sleep Medicine.  [facility] Office Number: [phone]   07/04/2018

## 2018-07-05 ENCOUNTER — Encounter: Payer: Self-pay | Admitting: Internal Medicine

## 2018-07-05 ENCOUNTER — Ambulatory Visit: Payer: Medicare HMO | Admitting: Internal Medicine

## 2018-07-05 VITALS — BP 128/84 | HR 62 | Ht 63.0 in | Wt 233.6 lb

## 2018-07-05 DIAGNOSIS — R0609 Other forms of dyspnea: Secondary | ICD-10-CM | POA: Diagnosis not present

## 2018-07-05 DIAGNOSIS — D869 Sarcoidosis, unspecified: Secondary | ICD-10-CM | POA: Diagnosis not present

## 2018-07-05 DIAGNOSIS — Z6841 Body Mass Index (BMI) 40.0 and over, adult: Secondary | ICD-10-CM | POA: Diagnosis not present

## 2018-07-05 NOTE — Patient Instructions (Addendum)
Continue current dose of methotrexate until next visit.  Continue using CPAP every night.

## 2018-07-27 DIAGNOSIS — G4733 Obstructive sleep apnea (adult) (pediatric): Secondary | ICD-10-CM | POA: Diagnosis not present

## 2018-07-30 ENCOUNTER — Other Ambulatory Visit: Payer: Self-pay | Admitting: Internal Medicine

## 2018-08-09 DIAGNOSIS — G4733 Obstructive sleep apnea (adult) (pediatric): Secondary | ICD-10-CM | POA: Diagnosis not present

## 2018-08-27 DIAGNOSIS — G4733 Obstructive sleep apnea (adult) (pediatric): Secondary | ICD-10-CM | POA: Diagnosis not present

## 2018-09-26 DIAGNOSIS — G4733 Obstructive sleep apnea (adult) (pediatric): Secondary | ICD-10-CM | POA: Diagnosis not present

## 2018-10-27 DIAGNOSIS — G4733 Obstructive sleep apnea (adult) (pediatric): Secondary | ICD-10-CM | POA: Diagnosis not present

## 2018-11-09 DIAGNOSIS — E7800 Pure hypercholesterolemia, unspecified: Secondary | ICD-10-CM | POA: Diagnosis not present

## 2018-11-09 DIAGNOSIS — Z5181 Encounter for therapeutic drug level monitoring: Secondary | ICD-10-CM | POA: Diagnosis not present

## 2018-11-09 DIAGNOSIS — R7309 Other abnormal glucose: Secondary | ICD-10-CM | POA: Diagnosis not present

## 2018-11-09 DIAGNOSIS — R399 Unspecified symptoms and signs involving the genitourinary system: Secondary | ICD-10-CM | POA: Diagnosis not present

## 2018-11-10 ENCOUNTER — Other Ambulatory Visit: Payer: Self-pay | Admitting: Internal Medicine

## 2018-11-10 DIAGNOSIS — Z1231 Encounter for screening mammogram for malignant neoplasm of breast: Secondary | ICD-10-CM

## 2018-11-25 ENCOUNTER — Encounter: Payer: Self-pay | Admitting: Internal Medicine

## 2018-11-25 ENCOUNTER — Other Ambulatory Visit: Payer: Self-pay

## 2018-11-25 ENCOUNTER — Ambulatory Visit: Payer: Medicare HMO | Admitting: Internal Medicine

## 2018-11-25 VITALS — BP 120/74 | HR 66 | Temp 97.9°F | Ht 63.0 in | Wt 232.8 lb

## 2018-11-25 DIAGNOSIS — D869 Sarcoidosis, unspecified: Secondary | ICD-10-CM

## 2018-11-25 NOTE — Patient Instructions (Signed)
Will check blood work and chest xray in about 3 months, then see you back after that.  Continue using methotrexate.

## 2018-11-25 NOTE — Progress Notes (Signed)
[facility] Consultation      Assessment and Plan:  The patient is a 68 year old female with a history of mediastinal adenopathy and biopsy proven sarcoidosis.  Sarcoidosis. -QuantiFERON negative. --Currently on methotrexate 10 mg weekly and doing well, weaned off of prednisone.  -We will check repeat ACE level and complete metabolic panel in 3 months. --PPSV 23-03/14/18.   Mediastinal lymphadenopathy. -Secondary to sarcoidosis, will continue to monitor.   Chronic kidney disease. -The patient is status post right nephrectomy, now with chronic renal failure. -Will need to monitor renal function while pt is on methotrexate.   Obstructive sleep apnea. --Stop using due to sunburn, encouraged her to get back on CPAP, using it nightly.  Lung nodules. -Seen on CT chest from 03/10/2016; suspect that these are related to saroidosis.    Orders Placed This Encounter  Procedures  . DG Chest 2 View  . Comp Met (CMET)   Return in about 3 months (around 02/25/2019).    Date: 11/25/2018  MRN# [id number] Jill Bond 05-Dec-1950   Jill Bond is a 68 y.o. old female seen in consultation for chief complaint of:    Chief Complaint  Patient presents with  . Follow-up    CPAP-feels like it is working. Has blisters in mouth thinks is due to methotrexate. No breathing issues.    HPI:  Jill Bond is a 68 y.o. female  with sarcoidosis, confirmed on supraclavicular lymph node biopsy on 03/11/2016.  She was also noted to have small upper abdominal adenopathy, bibasilar lung nodules, as well as hypercalcemia and 30 pound weight loss.  She has a history of "renal cell carcinoma" status post left nephrectomy in 2007, pathology from which revealed multiple angiomyolipomas.  She has been feeling well, has been working outside in the yard and her breathing has been doing well.  She has not been using cpap because she got a sunburn.  She feels that her breathing has  been doing well. She has been sleeping well also.  She remains off of prednisone, she is taking 4 tabs of methotrexate once per week.    **CPAP done on 10/26/2018-11/24/2018>> uses greater than 4 hours is 12/3 days.  Average usage on days used 7 hours 4 minutes.  Pressure ranges 5-15.  Median pressure is 8, 95th percentile pressure is 12, maximum pressure 13.5.  Leaks are within normal limits.  Residual AHI is 4.7.  This shows inadequate compliance with CPAP with excellent control obstructive sleep apnea when used. **CPAP download 06/04/2018-07/03/2018>> raw data personally reviewed, usage greater than 4 hours is 27/30 days.  Average usage on days used is 6 hours 30 minutes.  Pressure ranges 5-15.  Median pressure 9, maximum pressure 14, 95th percentile pressure 13.  Leaks are within normal limits residual AHI is 4.  Overall this shows good compliance with excellent control of obstructive sleep apnea. **Review of labs 06/28/2018>> ACE level 52, calcium and renal function are stable. **Review of labs 03/08/2018>> BMP, calcium, ACE, liver functions are all stable. **CPAP download 02/12/2018-03/13/2018>> raw data personally reviewed, uses greater than 4 hours is 30/30 days.  Average usage on days used 7 hours 4 minutes.  Pressure ranges 5-15.  Median pressure 8, 95th percentile pressure 12, maximum pressure 14.  Residual AHI is 4.1.  Overall this shows excellent compliance with CPAP, excellent control of obstructive sleep apnea. **Download data 10/30/2017-11/28/2017, data personally reviewed, usage greater than 4 hours 23/30 days.  Average usage on days used is 7 hours.  Set pressure is 5-15.  Median pressure is 9, 93 percentile pressure is 13, maximum pressure is 14.  Residual AHI is 5.5.  Overall this shows good compliance with good control of obstructive sleep apnea. **Labs 10/18/2017>> calcium 10.6, creatinine 1.07. **HST 09/28/2017>> AHI 18, auto CPAP recommended 5-15. **08/16/17; ACE level back up to 84, calcium  9.4>>9.8 **05/07/17; ACE level 86>>51; other labs normal. **QuantiFERON 04/09/17; negative **09/08/2016; Ace levelof 72, down from 85 on 03/20/2016. **09/08/2016; calcium level down to 10.6, renal function normalizing. **She underwent ultrasound guided supraclavicular lymph node biopsy on 03/11/2016.  Pathology revealed non-necrotizing granulomatous inflammation with fibrosis.  There was no evidence of malignancy.  GMS-fungal, AFB and Gram stains were negative. **Pathology from the left nephrectomy specimen showed multiple angiomyolipomas without evidence of renal cell carcinoma ([id number]).  **PFT 12/15/2016; -FVC 76% of predicted, FEV1 is 70% of predicted, ratio was 83%. No bronchodilator was administered. -TLC 95%, RV/TLC ratio is mildly elevated. Residual volume is 127%. -DLCO is mildly reduced at 71%. Flow volume loop appears rechecked it. Overall this test appears consistent with mild restrictive  lung disease, likely secondary to body habitus.  Medication:    Current Outpatient Medications:  .  amLODipine (NORVASC) 10 MG tablet, Take 10 mg by mouth at bedtime., Disp: , Rfl:  .  docusate sodium (COLACE) 100 MG capsule, Take 200 mg by mouth at bedtime., Disp: , Rfl:  .  hydrochlorothiazide (HYDRODIURIL) 25 MG tablet, , Disp: , Rfl:  .  methocarbamol (ROBAXIN) 500 MG tablet, Take 1 tablet (500 mg total) by mouth every 6 (six) hours as needed for muscle spasms., Disp: 90 tablet, Rfl: 1 .  methotrexate (RHEUMATREX) 2.5 MG tablet, TAKE 4 TABLETS (10 MG TOTAL) BY MOUTH ONCE A WEEK. CAUTION:CHEMOTHERAPY. PROTECT FROM LIGHT., Disp: 52 tablet, Rfl: 2 .  metoprolol (TOPROL-XL) 200 MG 24 hr tablet, Take 100 mg by mouth at bedtime. , Disp: , Rfl:  .  Multiple Vitamin (MULTIVITAMIN WITH MINERALS) TABS tablet, Take 0.5 tablets by mouth daily. , Disp: , Rfl:  .  OMEGA-3 FATTY ACIDS PO, Take 2 capsules by mouth daily. , Disp: , Rfl:  .  omeprazole (PRILOSEC) 20 MG capsule, , Disp: , Rfl:  .   ondansetron (ZOFRAN) 4 MG tablet, , Disp: , Rfl:  .  vitamin E 400 UNIT capsule, Take 400 mg by mouth daily., Disp: , Rfl:    Allergies:  Codeine and Penicillins  Review of Systems:  Constitutional: Feels well. Cardiovascular: Denies chest pain, exertional chest pain.  Pulmonary: Denies hemoptysis, pleuritic chest pain.   The remainder of systems were reviewed and were found to be negative other than what is documented in the HPI.    Physical Examination:   VS: BP 120/74 (BP Location: Left Wrist, Cuff Size: Normal)   Pulse 66   Temp 97.9 F (36.6 C) (Skin)   Ht _0  (1.6 m)   Wt 232 lb 12.8 oz (105.6 kg)   SpO2 99%   BMI 41.24 kg/m   General Appearance: No distress  Neuro:without focal findings, mental status, speech normal, alert and oriented HEENT: PERRLA, EOM intact Pulmonary: No wheezing, No rales  CardiovascularNormal S1,S2.  No m/r/g.  Abdomen: Benign, Soft, non-tender, No masses Renal:  No costovertebral tenderness  GU:  No performed at this time. Endoc: No evident thyromegaly, no signs of acromegaly or Cushing features Skin:   warm, no rashes, no ecchymosis  Extremities: normal, no cyanosis, clubbing.      LABORATORY PANEL:  CBC No results for input(s): WBC, HGB, HCT, PLT in the last 168 hours. ------------------------------------------------------------------------------------------------------------------  Chemistries  No results for input(s): NA, K, CL, CO2, GLUCOSE, BUN, CREATININE, CALCIUM, MG, AST, ALT, ALKPHOS, BILITOT in the last 168 hours.  Invalid input(s): GFRCGP ------------------------------------------------------------------------------------------------------------------  Cardiac Enzymes No results for input(s): TROPONINI in the last 168 hours. ------------------------------------------------------------  RADIOLOGY:  No results found.     Thank  you for the consultation and for allowing [facility] to  assist in the care of your patient. Our recommendations are noted above.  Please contact us if we can be of further service.  Marda Stalker, M.D., F.C.C.P.  Board Certified in Internal Medicine, Pulmonary Medicine, [place], and Sleep Medicine.  [facility] Office Number: [phone]   11/25/2018

## 2018-12-16 ENCOUNTER — Ambulatory Visit
Admission: RE | Admit: 2018-12-16 | Discharge: 2018-12-16 | Disposition: A | Discharge status: Home / Self Care | Payer: Medicare HMO | Source: Ambulatory Visit | Attending: Internal Medicine | Admitting: Internal Medicine

## 2018-12-16 ENCOUNTER — Other Ambulatory Visit: Payer: Self-pay

## 2018-12-16 DIAGNOSIS — Z1231 Encounter for screening mammogram for malignant neoplasm of breast: Secondary | ICD-10-CM

## 2018-12-20 ENCOUNTER — Other Ambulatory Visit: Payer: Self-pay | Admitting: Internal Medicine

## 2018-12-20 DIAGNOSIS — N6310 Unspecified lump in the right breast, unspecified quadrant: Secondary | ICD-10-CM

## 2018-12-20 DIAGNOSIS — R928 Other abnormal and inconclusive findings on diagnostic imaging of breast: Secondary | ICD-10-CM

## 2019-01-04 ENCOUNTER — Telehealth: Payer: Self-pay | Admitting: Internal Medicine

## 2019-01-04 NOTE — Telephone Encounter (Signed)
Message sent to [place] to [facility] to see if they can help with this issue. Rhonda J Cobb

## 2019-01-04 NOTE — Telephone Encounter (Signed)
Called and spoke to pt's daughter, Tonette Bihari.  [name] stated that [employer] is charging pt 120 dollars a month for cpap machine. [employer] is needing CMN back dated to February  [name] has requested that our office contact [facility] at [phone].  Suanne Marker, can you help with this?

## 2019-01-05 NOTE — Telephone Encounter (Signed)
CM sent to Gardiner Rhyme with [employer] and she will contact daughter to explain what has occurred since the merger.  I spoke with pt's daughter, Jill Bond today and explained that [employer] would reach out to her, that the CPAP has been re-billed to [facility]. Daughter is aware and advised to reach out to me again if she didn't hear anything about this issue from [place]. Rhonda J Cobb

## 2019-01-09 ENCOUNTER — Other Ambulatory Visit: Payer: Medicare HMO

## 2019-01-09 ENCOUNTER — Ambulatory Visit: Payer: Medicare HMO

## 2019-01-10 DIAGNOSIS — C50919 Malignant neoplasm of unspecified site of unspecified female breast: Secondary | ICD-10-CM

## 2019-01-10 HISTORY — DX: Malignant neoplasm of unspecified site of unspecified female breast: C50.919

## 2019-01-13 DIAGNOSIS — G4733 Obstructive sleep apnea (adult) (pediatric): Secondary | ICD-10-CM | POA: Diagnosis not present

## 2019-01-17 ENCOUNTER — Ambulatory Visit
Admission: RE | Admit: 2019-01-17 | Discharge: 2019-01-17 | Disposition: A | Discharge status: Home / Self Care | Payer: Medicare HMO | Source: Ambulatory Visit | Attending: Internal Medicine | Admitting: Internal Medicine

## 2019-01-17 DIAGNOSIS — N6310 Unspecified lump in the right breast, unspecified quadrant: Secondary | ICD-10-CM | POA: Insufficient documentation

## 2019-01-17 DIAGNOSIS — R928 Other abnormal and inconclusive findings on diagnostic imaging of breast: Secondary | ICD-10-CM | POA: Diagnosis not present

## 2019-01-17 DIAGNOSIS — N6313 Unspecified lump in the right breast, lower outer quadrant: Secondary | ICD-10-CM | POA: Diagnosis not present

## 2019-01-19 ENCOUNTER — Other Ambulatory Visit: Payer: Self-pay | Admitting: Internal Medicine

## 2019-01-19 DIAGNOSIS — N6310 Unspecified lump in the right breast, unspecified quadrant: Secondary | ICD-10-CM

## 2019-01-19 DIAGNOSIS — R928 Other abnormal and inconclusive findings on diagnostic imaging of breast: Secondary | ICD-10-CM

## 2019-01-23 ENCOUNTER — Ambulatory Visit
Admission: RE | Admit: 2019-01-23 | Discharge: 2019-01-23 | Disposition: A | Discharge status: Home / Self Care | Payer: Medicare HMO | Source: Ambulatory Visit | Attending: Internal Medicine | Admitting: Internal Medicine

## 2019-01-23 DIAGNOSIS — R928 Other abnormal and inconclusive findings on diagnostic imaging of breast: Secondary | ICD-10-CM

## 2019-01-23 DIAGNOSIS — N6310 Unspecified lump in the right breast, unspecified quadrant: Secondary | ICD-10-CM | POA: Insufficient documentation

## 2019-01-23 DIAGNOSIS — R921 Mammographic calcification found on diagnostic imaging of breast: Secondary | ICD-10-CM | POA: Diagnosis not present

## 2019-01-23 DIAGNOSIS — C50211 Malignant neoplasm of upper-inner quadrant of right female breast: Secondary | ICD-10-CM | POA: Diagnosis not present

## 2019-01-23 DIAGNOSIS — N6321 Unspecified lump in the left breast, upper outer quadrant: Secondary | ICD-10-CM | POA: Diagnosis not present

## 2019-01-23 DIAGNOSIS — C50411 Malignant neoplasm of upper-outer quadrant of right female breast: Secondary | ICD-10-CM | POA: Diagnosis not present

## 2019-01-23 DIAGNOSIS — N6311 Unspecified lump in the right breast, upper outer quadrant: Secondary | ICD-10-CM | POA: Diagnosis not present

## 2019-01-23 HISTORY — PX: BREAST BIOPSY: SHX20

## 2019-01-24 ENCOUNTER — Other Ambulatory Visit: Payer: Self-pay | Admitting: Anatomic Pathology & Clinical Pathology

## 2019-01-25 ENCOUNTER — Other Ambulatory Visit: Payer: Self-pay

## 2019-01-25 DIAGNOSIS — C50411 Malignant neoplasm of upper-outer quadrant of right female breast: Secondary | ICD-10-CM | POA: Diagnosis not present

## 2019-01-25 DIAGNOSIS — C50919 Malignant neoplasm of unspecified site of unspecified female breast: Secondary | ICD-10-CM

## 2019-01-25 NOTE — Progress Notes (Signed)
  Oncology Nurse Navigator Documentation  Navigator Location: [facility] (01/25/19 1500) Referral Date to RadOnc/MedOnc: 01/31/19 (01/25/19 1500) )Navigator Encounter Type: Introductory Phone Call (01/25/19 1500)   Abnormal Finding Date: 01/17/19 (01/25/19 1500) Confirmed Diagnosis Date: 01/23/19 (01/25/19 1500)               Patient Visit Type: Initial (01/25/19 1500) Treatment Phase: Pre-Tx/Tx Discussion (01/25/19 1500) Barriers/Navigation Needs: Anxiety;Coordination of Care;Education (01/25/19 1500)   Interventions: Coordination of Care;Psycho-Social Support (01/25/19 1500)                      Time Spent with Patient: 30 (01/25/19 1500)   Received notification of patient's breast cancer diagnosis.  Spoke with patient through daughter Jill Bond, who will provide transportation and support through treatment.  Scheduled to see Dr. Windell Moment today at 3:00.  Patient is an existing patient of Dr. Mike Gip, and a consult is scheduled to see her on 01/31/19.

## 2019-01-26 ENCOUNTER — Other Ambulatory Visit: Payer: Self-pay | Admitting: General Surgery

## 2019-01-26 DIAGNOSIS — C50411 Malignant neoplasm of upper-outer quadrant of right female breast: Secondary | ICD-10-CM

## 2019-01-26 NOTE — Progress Notes (Signed)
  Oncology Nurse Navigator Documentation  Navigator Location: [facility] (01/26/19 1000)   )        Expected Surgery Date: 02/06/19 (01/26/19 1000)             Patient Visit Type: Follow-up (01/26/19 1000)       Interventions: Coordination of Care;Education;Psycho-Social Support (01/26/19 1000)     Education Method: Teach-back;Verbal;Written (01/26/19 1000)                Time Spent with Patient: 15 (01/26/19 1000)   Patient pleased with Surgical consult.  Tentative surgery date 02/06/2019, dependent on final pathology, and Med Onc recommendationn per patient's daughter.  Unable to deliver Breast Cancer Treatment Handbook to this visit. Plan to have at Oncology consult.

## 2019-01-26 NOTE — Progress Notes (Signed)
[facility]  [address] [place], [place] [address] Phone: [phone]  Fax: [phone]   Clinic Day:  01/31/2019  Referring physician: Baxter Hire, MD  Chief Complaint: Jill Bond is a 68 y.o. female with sarcoid and right breast cancer who is referred in consultation with Dr. Meade Maw for assessment and management.   HPI:  The patient was last seen in the medical oncology clinic on 03/20/2016.  She had recently under gone ultrasound guided supraclavicular lymph node biopsy on 03/11/2016.  Pathology confirmed non-necrotizing granulomatous inflammation with fibrosis.  Abdominal and pelvis CT on 02/26/2016 revealed multiple new bibasilar lung nodules measuring up to 5 mm in size. There was new para-aortic and gastrohepatic lymph nodes measuring up to 1 cm in sizeChest CT on 03/10/2016 revealed numerous bilateral pulmonary nodules, measuring up to 9 mm in the central left lower lobe.  There was thoracic lymphadenopathy, including a dominant 12 mm short axis node at the left thoracic inlet and a 15 mm short axis right hilar node.  Bone scan on 03/10/2016 revealed no findings to suggest primary or metastatic malignancy within the bones. Uptake in the lower lumbar spine, knees, ankles, and right foot were most compatible with degenerative and postsurgical changes.    She was referred to Dr. Ashby Dawes of pulmonary medicine.  She was last seen by Dr. Ashby Dawes on 11/25/2018. She is on methotrexate 10 mg weekly. She was weaned off of prednisone.   Bilateral screening mammogram on 12/16/2018 revealed a mass in the right breast.  There were no suspicious findings in the left breast.  Right diagnostic mammogram and ultrasound on 01/17/2019 revealed a  1 x 1.2 x 1.1 cm hypoechoic mass in the right breast at the 9:30 position, 7 cm from the nipple.  There were indeterminate calcifications in the upper-outer quadrant of the right breast.  Lymph nodes  were unremarkable.  She underwent 2 biopsies of the right breast on 01/23/2019.  Right upper outer quadrant biopsy revealed benign mammary parenchyma with focal fibroadenomatoid changes and usual ductal hyperplasia.  There were calcifications associated with benign mammary parenchyma.  There was no atypical proliferative breast disease.  Right breast biopsy at 9:30 o'clock 7 cm from the nipple revealed grade I invasive mammary carcinoma, no special type.  There were calcifications associated with malignancy.  Tumor was 10 mm in the sample.  There was low-grade DCIS.  There was no apparent lymphovascular invasion.  Tumor was ER positive (> 90%) PR positive (51-90%) and HER-2/neu negative (1+).  Pathology results were concordant.    Bone density on 11/24/2014 was normal.   She was seen in consultation with by Dr. Herbert Pun on 01/25/2019.  Surgical options were discussed. She is scheduled for lumpectomy on 02/06/2019.  Labs included a hematocrit of 42.8, hemoglobin 14.1, MCV 97.3, platelets 197,000, WBC 7900 with an ANC of 5520.  Creatinine was 1.1 (CrCl 50 ml/min).  Alkaline phosphatase was 126 (34-104), AST 36, ALT 41, and bilirubin 1.0.  Symptomatically, she feels "all right". She has leg and feet cramps. She notes bad memory 3 months ago, but has since improved. She reports a change in taste; she could not eat anything sweet. She notes having a headache for 2 months that has since resolved. She notes eye pain, floaters, and leaky eyes. She notes a bad cough. She has chronic rhinorrhea and sneezing. She wheezes on exertion.  She notes a UTI 1 month ago.  She fell 1 month ago and  landed on her knees. She is thirsty all the time. At night her mouth is dry which she attributes to using the CPAP machine. She is due for pulmonary function tests.   She has right upper chest pain. She has had chronic back pain x 2 years.     She had menarche at age 3.  She had menopause at age 32 following  hysterectomy.  She had 2 pregnancies, and breastfeed her children.  She had 2 prior breast biopsies.  The patient's mother had breast cancer in her 21s as well as 2 cousins (maternal). Her aunt had ovarian cancer (maternal).  Father had prostate cancer.  Brother had esophageal cancer. Her daughter tested negative for BRCA gene. The patient is interested in genetic testing.   Past Medical History:  Diagnosis Date   Anxiety    Arthritis    Chronic kidney disease 2006   Left Nephrectomy   Complication of anesthesia    PONV   Constipation    Depression    GERD (gastroesophageal reflux disease)    Headache    Hypertension    PONV (postoperative nausea and vomiting)    Rotator cuff disorder    LEFT   Shortness of breath dyspnea    with exertion    Past Surgical History:  Procedure Laterality Date   ABDOMINAL HYSTERECTOMY     PARTIAL   ACHILLES TENDON SURGERY Left 02/22/2015   Procedure: Secondary ACHILLES TENDON REPAIR;  Surgeon: Albertine Patricia, DPM;  Location: [facility] ORS;  Service: Podiatry;  Laterality: Left;   Bladder tac     BREAST BIOPSY Right 01/23/2019   affirm stereo bx of calcs with x marker, path pending   BREAST BIOPSY Right 01/23/2019   [place] bx of mass with coil marker, path pending   BREAST EXCISIONAL BIOPSY Left 2003   neg   COLONOSCOPY  2013   JOINT REPLACEMENT Left 2007   Total Knee Replacement   KIDNEY CYST REMOVAL Left    OSTECTOMY Left 02/22/2015   Procedure: OSTECTOMY;  Surgeon: Albertine Patricia, DPM;  Location: [facility] ORS;  Service: Podiatry;  Laterality: Left;   SHOULDER ARTHROSCOPY W/ ROTATOR CUFF REPAIR Left YRS AGO   TONSILLECTOMY  AGE 29   TOTAL KNEE ARTHROPLASTY Right 10/22/2015   Procedure: RIGHT TOTAL KNEE ARTHROPLASTY;  Surgeon: Paralee Cancel, MD;  Location: [facility];  Service: Orthopedics;  Laterality: Right;   TOTAL KNEE ARTHROPLASTY Right    10/22/2015    Family History  Problem Relation Age of Onset   Breast cancer  Mother 100   Heart failure Father    Prostate cancer Father     Social History:  reports that she has never smoked. She has never used smokeless tobacco. She reports that she does not drink alcohol or use drugs.  She denies any exposure to radiation or toxins.  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  Her husband died on Sep 16, 2013. Her first cousin is, Electronics engineer. Her daughter, [name], is her medical power of attorney. The patient is accompanied by her daughter, [name] [phone]), via face time today.    Allergies:  Allergies  Allergen Reactions   Codeine Nausea And Vomiting   Penicillins Hives and Other (See Comments)    Has patient had a PCN reaction causing immediate rash, facial/tongue/throat swelling, SOB or lightheadedness with hypotension: no Has patient had a PCN reaction causing severe rash involving mucus membranes or skin necrosis: no Has patient had a PCN reaction that required hospitalization  no Has patient had a PCN reaction occurring within the last 10 years: no If all of the above answers are "NO", then may proceed with Cephalosporin use.     Current Medications: Current Outpatient Medications  Medication Sig Dispense Refill   amLODipine (NORVASC) 10 MG tablet Take 10 mg by mouth at bedtime.     diclofenac sodium (VOLTAREN) 1 % GEL Apply 1 application topically 2 (two) times daily as needed (foot pain).     docusate sodium (COLACE) 100 MG capsule Take 200 mg by mouth at bedtime.     methotrexate (RHEUMATREX) 2.5 MG tablet TAKE 4 TABLETS (10 MG TOTAL) BY MOUTH ONCE A WEEK. CAUTION:CHEMOTHERAPY. PROTECT FROM LIGHT. 52 tablet 2   metoprolol succinate (TOPROL-XL) 100 MG 24 hr tablet Take 100 mg by mouth at bedtime.      Multiple Vitamin (MULTIVITAMIN WITH MINERALS) TABS tablet Take 0.5 tablets by mouth daily.      OMEGA-3 FATTY ACIDS PO Take 1,000 mg by mouth daily.      omeprazole (PRILOSEC) 20 MG capsule Take 20 mg by mouth daily.      vitamin  E 400 UNIT capsule Take 400 mg by mouth daily.     No current facility-administered medications for this visit.     Review of Systems  Constitutional: Negative for chills, diaphoresis, fever, malaise/fatigue and weight loss.       Feels "all right".  HENT: Negative for congestion, ear discharge, ear pain, hearing loss, nosebleeds, sinus pain and sore throat.        Chronic rhinorrhea and sneezing.  Eyes: Positive for pain. Negative for blurred vision and double vision.       Leaky eyes. Floaters.  Respiratory: Positive for cough and wheezing (on exertion). Negative for hemoptysis, sputum production and shortness of breath.        Sleep apnea on CPAP at night.  Cardiovascular: Positive for chest pain (upper right side). Negative for palpitations, orthopnea, claudication and leg swelling.  Gastrointestinal: Negative for abdominal pain, blood in stool, constipation, diarrhea, melena, nausea and vomiting.  Genitourinary: Negative for dysuria, frequency, hematuria and urgency.  Musculoskeletal: Positive for back pain (chronic x 2 years) and falls (1 month ago). Negative for joint pain, myalgias and neck pain.       Leg and feet cramps.  Skin: Negative for itching and rash.  Neurological: Negative for dizziness, tingling, sensory change, weakness and headaches (resolved).  Endo/Heme/Allergies: Does not bruise/bleed easily.  Psychiatric/Behavioral: Positive for memory loss (3 months ago, improving). Negative for depression. The patient is not nervous/anxious and does not have insomnia.   All other systems reviewed and are negative.  Performance status (ECOG): 1  Vitals Blood pressure (!) 162/78, pulse 69, temperature 98 F (36.7 C), temperature source Tympanic, resp. rate 18, height '5\' 3"'$  (1.6 m), weight 216 lb 0.8 oz (98 kg), SpO2 100 %.  Physical Exam  Constitutional: She is oriented to person, place, and time. She appears well-developed and well-nourished. No distress.  HENT:  Head:  Normocephalic.  Mouth/Throat: Oropharynx is clear and moist. No oropharyngeal exudate.  Shoulder length brown graying hair.  Eyes: Pupils are equal, round, and reactive to light. Conjunctivae are normal. No scleral icterus.  Glasses.  Neck: Normal range of motion. Neck supple. No JVD present.  Cardiovascular: Normal rate, regular rhythm and normal heart sounds.  No murmur heard. Pulmonary/Chest: Effort normal and breath sounds normal. No respiratory distress. She has no wheezes. She has no rales. She exhibits no tenderness.  Left breast exhibits skin change (fibrocystic changes) and tenderness.  Right breast hematoma s/p biopsy.  Steri-strips in place.  Abdominal: Soft. Bowel sounds are normal. She exhibits no distension and no mass. There is no abdominal tenderness. There is no rebound and no guarding.  Musculoskeletal: Normal range of motion.        General: Tenderness (sacrum; T4) present. No edema.  Lymphadenopathy:    She has no cervical adenopathy.    She has no axillary adenopathy.       Right: No supraclavicular adenopathy present.       Left: No supraclavicular adenopathy present.  Neurological: She is alert and oriented to person, place, and time.  Skin: Skin is warm and dry. No rash noted. She is not diaphoretic. No erythema. No pallor.  Psychiatric: She has a normal mood and affect. Her behavior is normal. Judgment and thought content normal.  Nursing note and vitals reviewed.   No visits with results within 3 Day(s) from this visit.  Latest known visit with results is:  Hospital Outpatient Visit on 01/23/2019  Component Date Value Ref Range Status   SURGICAL PATHOLOGY 01/23/2019    Final-Edited                   Value:SURGICAL PATHOLOGY THIS IS AN ADDENDUM REPORT CASE: [id number] PATIENT: Jill Bond Surgical Pathology Report Addendum  Reason for Addendum #1:  Breast Biomarker Results  SPECIMEN SUBMITTED: A. Breast, right, UOQ B. Breast, right, 9:30, 7  CMFN  CLINICAL HISTORY: 1.  Coarse heterogenous CALCS upper outer spanning 0.5 cm, separate from 2.  mass 1.2 cm irregular mass 9:30 right breast; X-shaped clip RIGHT upper outer quadrant, coil-shaped clip RIGHT at 9:30  PRE-OPERATIVE DIAGNOSIS: 1.  Favor benign FA CALCS for specimen number 1.  Rule out DCIS.  2. Mass 9:30 should be IMC  POST-OPERATIVE DIAGNOSIS: None provided.  DIAGNOSIS: A. BREAST, RIGHT UPPER OUTER QUADRANT; STEREOTACTIC CORE BIOPSY: - BENIGN MAMMARY PARENCHYMA WITH FOCAL FIBROADENOMATOID CHANGES AND USUAL DUCTAL HYPERPLASIA. - CALCIFICATIONS ASSOCIATED WITH BENIGN MAMMARY PARENCHYMA. - NEGATIVE FOR ATYPICAL PROLIFERATIVE BREAST DISEASE.  B. BREAST, RIGHT AT 9                         30 O'CLOCK, 7 CM FROM THE NIPPLE; ULTRASOUND-GUIDED CORE BIOPSY: - INVASIVE MAMMARY CARCINOMA, NO SPECIAL TYPE. - CALCIFICATIONS ASSOCIATED WITH MALIGNANCY.  Size of invasive carcinoma: 10 mm in this sample Histologic grade of invasive carcinoma: Grade 1                      Glandular/tubular differentiation score: 1                      Nuclear pleomorphism score: 2                      Mitotic rate score: 1                      Total score: 4 Ductal carcinoma in situ: Present, low-grade Lymphovascular invasion: Not identified  ER/PR/HER2: Immunohistochemistry will be performed on block B1, with reflex to [place] for HER2 2+. The results will be reported in an addendum.  GROSS DESCRIPTION: A. Labeled: Right breast upper outer quadrant stereotactic biopsy calcs Received: in a formalin-filled CoreTainer transport system Accompanying specimen radiograph: Yes Time / date in fixative: Tissue procedure time 2:27 AM, tissue put in formalin time  8:30 AM 01/23/2019 Cold isch                         emic time: 3 minutes Total fixation time: 12 hours Core pieces: Multiple Measurement: 0.5-1.5 cm in length 0.1 cm in diameter Description / comments: Fibrofatty tissue cores Inked:  Black Entirely submitted. Block summary: 1- section E 2- section F 3-4-entirely submitted breast cores  B. Labeled: Right breast at 930 o'clock position and 7 cm from nipple Received: In formalin Time/date in fixative: Tissue procedure time 9 AM, tissue put in formalin time 9 AM Cold ischemic time: Less than 1 minute Total fixation time: 11 hours Core pieces: 2 Size: 1.2 cm in length and 0.2 cm in diameter each Description: Fibrofatty soft tissue cores Ink color: Green Entirely submitted in 1 cassette.  Final Diagnosis performed by Allena Napoleon, MD.   Electronically signed 01/24/2019 4:21:19PM The electronic signature indicates that the named Attending Pathologist has evaluated the specimen Technical component performed at [facility], [address], [place], [place] [address] Lab: 8                         [phone] Dir: Rush Farmer, MD, MMM  Professional component performed at [facility], [facility], [place], [place], [place] [address] Lab: [phone] Dir: Dellia Nims. Rubinas, MD  ADDENDUM: BREAST BIOMARKER TESTS Estrogen Receptor (ER) Status: POSITIVE                      Percentage of cells with nuclear positivity: Greater than 90%                      Average intensity of staining: Strong  Progesterone Receptor (PgR) Status: POSITIVE                      Percentage of cells with nuclear positivity: 51-90%                      Average intensity of staining: Moderate  HER2 (by immunohistochemistry): NEGATIVE (Score 1+)  Cold Ischemia and Fixation Times: Meet requirements specified in latest version of the ASCO/CAP guidelines Testing Performed on Block Number(s): B1  METHODS Fixative: Formalin Estrogen Receptor:  FDA cleared ([place]) Primary Antibody:  SP1 Progesterone Receptor: FDA cleared ([place]) Primary Antibody: 1E2 HER2 (by                          IHC): FDA cleared ([place]) Primary Antibody: 4B5 ([facility]) Immunohistochemistry  controls worked appropriately. Slides were prepared by [facility], [place], [place], and interpreted by Allena Napoleon, MD.  This test was developed and its performance characteristics determined by [facility]. It has not been cleared or approved by the [place] Food and Drug Administration. The FDA does not require this test to go through premarket FDA review. This test is used for clinical purposes. It should not be regarded as investigational or for research. This laboratory is certified under the Clinical Laboratory Improvement Amendments (CLIA) as qualified to perform high complexity clinical laboratory testing.   Addendum #1 performed by Allena Napoleon, MD.   Electronically signed 01/30/2019 3:06:04PM The electronic signature indicates that the named Attending Pathologist has evaluated the specimen Technical component performed at [facility], [address], [place], [place] [address] Lab: [phone]-  [place]: Rush Farmer, MD, MMM  Professional component performed at [facility], [facility], [place], [place],  [address] Lab: [phone] Dir: Dellia Nims. Reuel Derby, MD     Assessment:  Jill Bond is a 68 y.o. female with clinical stage I right breast cancer s/p biopsy on 01/23/2019.  Right breast biopsy at 9:30 o'clock 7 cm from the nipple revealed grade I invasive mammary carcinoma, no special type.  There were calcifications associated with malignancy.  Tumor was 10 mm in the sample.  There was low-grade DCIS.  There was no apparent lymphovascular invasion.  Tumor was ER positive (> 90%) PR positive (51-90%) and HER-2/neu negative (1+).  Right upper outer quadrant biopsy revealed benign mammary parenchyma with focal fibroadenomatoid changes and usual ductal hyperplasia.  There were calcifications associated with benign mammary parenchyma.  There was no atypical proliferative breast disease.  Pathology results were concordant.  Clinical stage is  T1bN0.  Bilateral screening mammogram on 12/16/2018 revealed a mass in the right breast.  There were no suspicious findings in the left breast.  Right diagnostic mammogram and ultrasound on 01/17/2019 revealed a  1 x 1.2 x 1.1 cm hypoechoic mass in the right breast at the 9:30 position, 7 cm from the nipple.  There were indeterminate calcifications in the upper-outer quadrant of the right breast.  Lymph nodes were unremarkable.  She has sarcoidosis s/p utrasound guided supraclavicular lymph node biopsy on 03/11/2016.  Pathology revealed non-necrotizing granulomatous inflammation with fibrosis.  There was no evidence of malignancy.  GMS-fungal, AFB and Gram stains were negative.  She presented with small upper abdominal adenopathy, bibasilar lung nodes, a 30 pound weight loss, some drenching night sweats, and mild hypercalcemia.    Abdominal and pelvic CT scan on 02/26/2016 revealed multiple new bibasilar lung nodules measuring up to 5 mm in size. There was new para-aortic and gastrohepatic lymph nodes measuring up to 1 cm in size, suspicious for metastatic disease.  She has a history of multiple angiomyolipomas s/p left nephrectomy on 03/15/2006.  Baseline creatinine is 0.9 - 1.1 (CrCl 50-63 ml/min).  Colonoscopy in 2013 revealed polyps (next scheduled 2018).  She had a partial hysterectomy at age 55.   Symptomatically, she notes chronic back pain x 2 years.  She has chronic respiratory symptoms.  Exam reveals post biopsy changes.  Plan: 1.   Labs today:  LFTs, FSH, estradiol, CA27.29, [facility] genetic testing. 2.   Clinical stage IA right breast cancer  Discuss diagnosis, staging and management of breast cancer.  She has a small hormone receptor + breast cancer.  Clinical stage is T1bN0.  Review final stage determined after lumpectomy and SLN biopsy.  Discuss likely plan for lumpectomy followed by radiation then endocrine therapy for at least 5 years.  Discuss Oncotype DX testing.  Side  effects of endocrine therapy reviewed (AI vs tamoxifen).  Discuss plan for follow-up bone density study secondary to potential bone thinning caused by aromatase inhibitors.  Discuss genetic testing- patient in agreement. 3.   Schedule bone density study. 4.   RTC 2 weeks after surgery (patient to call).   I discussed the assessment and treatment plan with the patient.  The patient was provided an opportunity to ask questions and all were answered.  The patient agreed with the plan and demonstrated an understanding of the instructions.  The patient was advised to call back if the symptoms worsen or if the condition fails to improve as anticipated.  I provided 48 minutes  of face-to-face time during this this encounter and > 50% was spent counseling as documented under my assessment and plan.    Kamryn Messineo C. Mike Gip, MD, PhD    01/31/2019, 10:00 AM  I, Selena Batten, am acting as scribe for [employer]. Mike Gip, MD, PhD.  I, Lakeith Careaga C. Mike Gip, MD, have reviewed the above documentation for accuracy and completeness, and I agree with the above.

## 2019-01-30 ENCOUNTER — Ambulatory Visit: Payer: Self-pay | Admitting: General Surgery

## 2019-01-30 LAB — SURGICAL PATHOLOGY

## 2019-01-30 NOTE — H&P (Signed)
PATIENT PROFILE: Jill Bond is a 68 y.o. female who presents to the Clinic for consultation at the request of Dr. Edwina Barth for evaluation of right breast cancer.  PCP:  Velna Ochs, MD  HISTORY OF PRESENT ILLNESS: Ms. Pittman reports she had her usual screening mammogram.  She was found with a suspicious area on the right breast.  This led to diagnostic mammogram and ultrasound.  Diagnostic mammogram and ultrasound confirmed 2 areas of concern.  One area at 9:00 shows 1 cm mass on the mammogram there is another area with calcifications.  Post area were biopsied.  The 9:00 1 cm mass was positive for invasive mammary carcinoma.  The calcifications were benign.  There was no suspicious axillary lymph node.  Patient denies any breast pain.  Patient denies any nipple discharge.  Patient denies any skin changes.  Family history of breast cancer: Mother in her 1s, and aunt and 2 cousins Family history of other cancers: Father with prostate cancer and brother with esophageal cancer Menarche: 68 years old Menopause: 68 years old after hysterectomy Used OCP: Unknown Used estrogen and progesterone therapy: Unknown History of Radiation to the chest: No history of radiation Breast biopsy: 2 Number of pregnancies: 2  PROBLEM LIST:         Problem List  Date Reviewed: 04/04/2018         Noted   Sarcoidosis 04/04/2018   Sleep apnea, obstructive 04/04/2018   CKD (chronic kidney disease) stage 3, GFR 30-59 ml/min (CMS-HCC) 05/21/2017   Non-caseating granuloma 03/11/2016   Presence of right artificial knee joint 10/22/2015   Cancer of kidney (CMS-HCC) 11/06/2014   Essential hypertension, benign 11/01/2013   Obesity 11/01/2013   Hypercholesterolemia 11/01/2013   Spinal stenosis 11/01/2013      GENERAL REVIEW OF SYSTEMS:   General ROS: negative for - chills, fatigue, fever, weight gain or weight loss Allergy and Immunology ROS: negative for - hives  Hematological and  Lymphatic ROS: negative for - bleeding problems or bruising, negative for palpable nodes Endocrine ROS: negative for - heat or cold intolerance, hair changes Respiratory ROS: negative for - cough, shortness of breath or wheezing Cardiovascular ROS: no chest pain or palpitations GI ROS: negative for nausea, vomiting, abdominal pain, diarrhea, constipation Musculoskeletal ROS: negative for - joint swelling or muscle pain Neurological ROS: negative for - confusion, syncope Dermatological ROS: negative for pruritus and rash Psychiatric: negative for anxiety, depression, difficulty sleeping and memory loss  MEDICATIONS: Current Medications        Current Outpatient Medications  Medication Sig Dispense Refill  . amLODIPine (NORVASC) 10 MG tablet TAKE 1 TABLET (10 MG TOTAL) BY MOUTH ONCE DAILY 90 tablet 3  . aspirin/acetaminophen/caffeine (MIGRAINE RELIEF ORAL) Take by mouth One twice a day    . methocarbamol (ROBAXIN) 500 MG tablet     . methotrexate sodium 2.5 mg DsPk TAKE 1 TAB WEEKLY FOR 1 WEEK, THEN 2 TAB WEEKLY FOR 1 MONTH, THEN IF TOLERATE, 3 TAB ONCE WEEKLY    . metoprolol succinate (TOPROL-XL) 200 MG XL tablet TAKE 1/2 TABLET EVERY DAY 45 tablet 0  . multivitamin with iron-minerals (VITAMINS AND MINERALS) tablet Take by mouth.    . omega-3 fatty acids-fish oil 300-500 mg Cap Take 1 capsule by mouth once daily      . omeprazole (PRILOSEC) 20 MG DR capsule TAKE 1 CAPSULE EVERY DAY 90 capsule 0  . vitamin E 400 UNIT capsule Take 800 Units by mouth once daily  No current facility-administered medications for this visit.       ALLERGIES: Codeine and Penicillins  PAST MEDICAL HISTORY:     Past Medical History:  Diagnosis Date  . Breast cancer (CMS-HCC)   . Cancer of kidney (CMS-HCC)   . Chickenpox   . Diabetes mellitus type 2, uncomplicated (CMS-HCC)   . Disorders of bursae and tendons in shoulder region, unspecified   . Gout, joint   . History of  chicken pox   . Osteoarthrosis, unspecified whether generalized or localized, lower leg   . Rotator cuff injury     PAST SURGICAL HISTORY:      Past Surgical History:  Procedure Laterality Date  . ARTHROSCOPIC ROTATOR CUFF REPAIR Left 03/26/08  . Bladder tack    . COLONOSCOPY  6/08  . HYSTERECTOMY     Partial  . KNEE ARTHROSCOPY Left 12/29/06   Total  . left achilles repair and removal of calcaneal spur on 02/22/15 Left 02/22/2015   MGT  . NEPHRECTOMY Left 03/15/06   For malignancy of the kidney  . NEPHRECTOMY Left 03/15/1006  . REPLACEMENT TOTAL KNEE BILATERAL Left 06/12/08   Cemented, computer-assisted navigation  . sinus operation    . TONSILLECTOMY       FAMILY HISTORY:      Family History  Problem Relation Age of Onset  . Diabetes type II Brother   . Cancer Brother        Unknown primary, metastatic  . Breast cancer Mother   . Colon cancer Father      SOCIAL HISTORY: Social History          Socioeconomic History  . Marital status: Widowed    Spouse name: Not on file  . Number of children: 2  . Years of education: Not on file  . Highest education level: Not on file  Occupational History  . Occupation: Disabled  Social Needs  . Financial resource strain: Not on file  . Food insecurity    Worry: Not on file    Inability: Not on file  . Transportation needs    Medical: Not on file    Non-medical: Not on file  Tobacco Use  . Smoking status: Never Smoker  . Smokeless tobacco: Never Used  Substance and Sexual Activity  . Alcohol use: No  . Drug use: No  . Sexual activity: Defer  Other Topics Concern  . Not on file  Social History Narrative  . Not on file      PHYSICAL EXAM:    Vitals:   01/25/19 1503  BP: 141/74  Pulse: 72   Body mass index is 41.45 kg/m. Weight: (!) 106.1 kg (234 lb)   GENERAL: Alert, active, oriented x3  HEENT: Pupils equal reactive to light. Extraocular movements are intact.  Sclera clear. Palpebral conjunctiva normal red color.Pharynx clear.  NECK: Supple with no palpable mass and no adenopathy.  LUNGS: Sound clear with no rales rhonchi or wheezes.  HEART: Regular rhythm S1 and S2 without murmur.  BREAST: breasts appear normal, no suspicious masses, no skin or nipple changes or axillary nodes.  ABDOMEN: Soft and depressible, nontender with no palpable mass, no hepatomegaly.  EXTREMITIES: Well-developed well-nourished symmetrical with no dependent edema.  NEUROLOGICAL: Awake alert oriented, facial expression symmetrical, moving all extremities.  REVIEW OF DATA: I have reviewed the following data today:      Appointment on 11/09/2018  Component Date Value  . Color 11/09/2018 Yellow   . Clarity 11/09/2018 SL Cloudy*  .  Specific Gravity 11/09/2018 1.020   . pH, Urine 11/09/2018 6.0   . Protein, Urinalysis 11/09/2018 Negative   . Glucose, Urinalysis 11/09/2018 Negative   . Ketones, Urinalysis 11/09/2018 Negative   . Blood, Urinalysis 11/09/2018 Negative   . Nitrite, Urinalysis 11/09/2018 Positive*  . Leukocyte Esterase, Urin* 11/09/2018 Trace*  . White Blood Cells, Urina* 11/09/2018 0-3   . Red Blood Cells, Urinaly* 11/09/2018 None Seen   . Bacteria, Urinalysis 11/09/2018 Rare*  . Squamous Epithelial Cell* 11/09/2018 Few   . Glucose 11/09/2018 100   . Sodium 11/09/2018 141   . Potassium 11/09/2018 4.3   . Chloride 11/09/2018 102   . Carbon Dioxide (CO2) 11/09/2018 32.2*  . Calcium 11/09/2018 9.9   . Urea Nitrogen (BUN) 11/09/2018 21   . Creatinine 11/09/2018 1.0   . Glomerular Filtration Ra* 11/09/2018 55*  . BUN/Crea Ratio 11/09/2018 21.0*  . Anion Gap w/K 11/09/2018 11.1   . Hemoglobin A1C 11/09/2018 5.7*  . Average Blood Glucose (C* 11/09/2018 117   . Cholesterol, Total 11/09/2018 179   . Triglyceride 11/09/2018 108   . HDL (High Density Lipopr* 11/09/2018 56.1   . LDL Calculated 11/09/2018 101   . VLDL Cholesterol 11/09/2018 22    . Cholesterol/HDL Ratio 11/09/2018 3.2      ASSESSMENT: Ms. Mitschke is a 68 y.o. female presenting for consultation for right breast cancer.    Patient was oriented again about the pathology results. Surgical alternatives were discussed with patient including partial vs total mastectomy. Surgical technique and post operative care was discussed with patient. Risk of surgery was discussed with patient including but not limited to: wound infection, seroma, hematoma, brachial plexopathy, mondor's disease (thrombosis of small veins of breast), chronic wound pain, breast lymphedema, altered sensation to the nipple and cosmesis among others.   ER PR and HER-2 receptors are in process.  Patient will have oncology evaluation on September 22.  I discussed with the patient about the surgical management of breast cancer.  After discussion with oncologist if they recommend to proceed with upfront surgery, will coordinate needle guided partial mastectomy and sentinel node biopsy.  Malignant neoplasm of upper-outer quadrant of right female breast, unspecified estrogen receptor status (CMS-HCC) [C50.411]  PLAN: 1. Right Needle guided partial mastectomy with sentinel lymph node biopsy (19301, [id number]) 2. CBC, CMP 3. Avoid taking aspirin 5 days before surgery 4. Expect appointment with Oncologist on 01/31/2019 5. Contact us if has any question or concern.   Patient and her daughter verbalized understanding, all questions were answered, and were agreeable with the plan outlined above.   This encounter was more than 60 minutes most of the time counseling the patient and coordinating plan of care.  Herbert Pun, MD  Electronically signed by Herbert Pun, MD

## 2019-01-30 NOTE — H&P (View-Only) (Signed)
PATIENT PROFILE: Jill Bond is a 68 y.o. female who presents to the Clinic for consultation at the request of Dr. Johnston for evaluation of right breast cancer.  PCP:  Johnston, John David, MD  HISTORY OF PRESENT ILLNESS: Ms. Hartzog reports she had her usual screening mammogram.  She was found with a suspicious area on the right breast.  This led to diagnostic mammogram and ultrasound.  Diagnostic mammogram and ultrasound confirmed 2 areas of concern.  One area at 9:00 shows 1 cm mass on the mammogram there is another area with calcifications.  Post area were biopsied.  The 9:00 1 cm mass was positive for invasive mammary carcinoma.  The calcifications were benign.  There was no suspicious axillary lymph node.  Patient denies any breast pain.  Patient denies any nipple discharge.  Patient denies any skin changes.  Family history of breast cancer: Mother in her 60s, and aunt and 2 cousins Family history of other cancers: Father with prostate cancer and brother with esophageal cancer Menarche: 68 years old Menopause: 68 years old after hysterectomy Used OCP: Unknown Used estrogen and progesterone therapy: Unknown History of Radiation to the chest: No history of radiation Breast biopsy: 2 Number of pregnancies: 2  PROBLEM LIST:         Problem List  Date Reviewed: 04/04/2018         Noted   Sarcoidosis 04/04/2018   Sleep apnea, obstructive 04/04/2018   CKD (chronic kidney disease) stage 3, GFR 30-59 ml/min (CMS-HCC) 05/21/2017   Non-caseating granuloma 03/11/2016   Presence of right artificial knee joint 10/22/2015   Cancer of kidney (CMS-HCC) 11/06/2014   Essential hypertension, benign 11/01/2013   Obesity 11/01/2013   Hypercholesterolemia 11/01/2013   Spinal stenosis 11/01/2013      GENERAL REVIEW OF SYSTEMS:   General ROS: negative for - chills, fatigue, fever, weight gain or weight loss Allergy and Immunology ROS: negative for - hives  Hematological and  Lymphatic ROS: negative for - bleeding problems or bruising, negative for palpable nodes Endocrine ROS: negative for - heat or cold intolerance, hair changes Respiratory ROS: negative for - cough, shortness of breath or wheezing Cardiovascular ROS: no chest pain or palpitations GI ROS: negative for nausea, vomiting, abdominal pain, diarrhea, constipation Musculoskeletal ROS: negative for - joint swelling or muscle pain Neurological ROS: negative for - confusion, syncope Dermatological ROS: negative for pruritus and rash Psychiatric: negative for anxiety, depression, difficulty sleeping and memory loss  MEDICATIONS: Current Medications        Current Outpatient Medications  Medication Sig Dispense Refill  . amLODIPine (NORVASC) 10 MG tablet TAKE 1 TABLET (10 MG TOTAL) BY MOUTH ONCE DAILY 90 tablet 3  . aspirin/acetaminophen/caffeine (MIGRAINE RELIEF ORAL) Take by mouth One twice a day    . methocarbamol (ROBAXIN) 500 MG tablet     . methotrexate sodium 2.5 mg DsPk TAKE 1 TAB WEEKLY FOR 1 WEEK, THEN 2 TAB WEEKLY FOR 1 MONTH, THEN IF TOLERATE, 3 TAB ONCE WEEKLY    . metoprolol succinate (TOPROL-XL) 200 MG XL tablet TAKE 1/2 TABLET EVERY DAY 45 tablet 0  . multivitamin with iron-minerals (VITAMINS AND MINERALS) tablet Take by mouth.    . omega-3 fatty acids-fish oil 300-500 mg Cap Take 1 capsule by mouth once daily      . omeprazole (PRILOSEC) 20 MG DR capsule TAKE 1 CAPSULE EVERY DAY 90 capsule 0  . vitamin E 400 UNIT capsule Take 800 Units by mouth once daily       No current facility-administered medications for this visit.       ALLERGIES: Codeine and Penicillins  PAST MEDICAL HISTORY:     Past Medical History:  Diagnosis Date  . Breast cancer (CMS-HCC)   . Cancer of kidney (CMS-HCC)   . Chickenpox   . Diabetes mellitus type 2, uncomplicated (CMS-HCC)   . Disorders of bursae and tendons in shoulder region, unspecified   . Gout, joint   . History of  chicken pox   . Osteoarthrosis, unspecified whether generalized or localized, lower leg   . Rotator cuff injury     PAST SURGICAL HISTORY:      Past Surgical History:  Procedure Laterality Date  . ARTHROSCOPIC ROTATOR CUFF REPAIR Left 03/26/08  . Bladder tack    . COLONOSCOPY  6/08  . HYSTERECTOMY     Partial  . KNEE ARTHROSCOPY Left 12/29/06   Total  . left achilles repair and removal of calcaneal spur on 02/22/15 Left 02/22/2015   MGT  . NEPHRECTOMY Left 03/15/06   For malignancy of the kidney  . NEPHRECTOMY Left 03/15/1006  . REPLACEMENT TOTAL KNEE BILATERAL Left 06/12/08   Cemented, computer-assisted navigation  . sinus operation    . TONSILLECTOMY       FAMILY HISTORY:      Family History  Problem Relation Age of Onset  . Diabetes type II Brother   . Cancer Brother        Unknown primary, metastatic  . Breast cancer Mother   . Colon cancer Father      SOCIAL HISTORY: Social History          Socioeconomic History  . Marital status: Widowed    Spouse name: Not on file  . Number of children: 2  . Years of education: Not on file  . Highest education level: Not on file  Occupational History  . Occupation: Disabled  Social Needs  . Financial resource strain: Not on file  . Food insecurity    Worry: Not on file    Inability: Not on file  . Transportation needs    Medical: Not on file    Non-medical: Not on file  Tobacco Use  . Smoking status: Never Smoker  . Smokeless tobacco: Never Used  Substance and Sexual Activity  . Alcohol use: No  . Drug use: No  . Sexual activity: Defer  Other Topics Concern  . Not on file  Social History Narrative  . Not on file      PHYSICAL EXAM:    Vitals:   01/25/19 1503  BP: 141/74  Pulse: 72   Body mass index is 41.45 kg/m. Weight: (!) 106.1 kg (234 lb)   GENERAL: Alert, active, oriented x3  HEENT: Pupils equal reactive to light. Extraocular movements are intact.  Sclera clear. Palpebral conjunctiva normal red color.Pharynx clear.  NECK: Supple with no palpable mass and no adenopathy.  LUNGS: Sound clear with no rales rhonchi or wheezes.  HEART: Regular rhythm S1 and S2 without murmur.  BREAST: breasts appear normal, no suspicious masses, no skin or nipple changes or axillary nodes.  ABDOMEN: Soft and depressible, nontender with no palpable mass, no hepatomegaly.  EXTREMITIES: Well-developed well-nourished symmetrical with no dependent edema.  NEUROLOGICAL: Awake alert oriented, facial expression symmetrical, moving all extremities.  REVIEW OF DATA: I have reviewed the following data today:      Appointment on 11/09/2018  Component Date Value  . Color 11/09/2018 Yellow   . Clarity 11/09/2018 SL Cloudy*  .   Specific Gravity 11/09/2018 1.020   . pH, Urine 11/09/2018 6.0   . Protein, Urinalysis 11/09/2018 Negative   . Glucose, Urinalysis 11/09/2018 Negative   . Ketones, Urinalysis 11/09/2018 Negative   . Blood, Urinalysis 11/09/2018 Negative   . Nitrite, Urinalysis 11/09/2018 Positive*  . Leukocyte Esterase, Urin* 11/09/2018 Trace*  . White Blood Cells, Urina* 11/09/2018 0-3   . Red Blood Cells, Urinaly* 11/09/2018 None Seen   . Bacteria, Urinalysis 11/09/2018 Rare*  . Squamous Epithelial Cell* 11/09/2018 Few   . Glucose 11/09/2018 100   . Sodium 11/09/2018 141   . Potassium 11/09/2018 4.3   . Chloride 11/09/2018 102   . Carbon Dioxide (CO2) 11/09/2018 32.2*  . Calcium 11/09/2018 9.9   . Urea Nitrogen (BUN) 11/09/2018 21   . Creatinine 11/09/2018 1.0   . Glomerular Filtration Ra* 11/09/2018 55*  . BUN/Crea Ratio 11/09/2018 21.0*  . Anion Gap w/K 11/09/2018 11.1   . Hemoglobin A1C 11/09/2018 5.7*  . Average Blood Glucose (C* 11/09/2018 117   . Cholesterol, Total 11/09/2018 179   . Triglyceride 11/09/2018 108   . HDL (High Density Lipopr* 11/09/2018 56.1   . LDL Calculated 11/09/2018 101   . VLDL Cholesterol 11/09/2018 22    . Cholesterol/HDL Ratio 11/09/2018 3.2      ASSESSMENT: Ms. Rickerson is a 68 y.o. female presenting for consultation for right breast cancer.    Patient was oriented again about the pathology results. Surgical alternatives were discussed with patient including partial vs total mastectomy. Surgical technique and post operative care was discussed with patient. Risk of surgery was discussed with patient including but not limited to: wound infection, seroma, hematoma, brachial plexopathy, mondor's disease (thrombosis of small veins of breast), chronic wound pain, breast lymphedema, altered sensation to the nipple and cosmesis among others.   ER PR and HER-2 receptors are in process.  Patient will have oncology evaluation on September 22.  I discussed with the patient about the surgical management of breast cancer.  After discussion with oncologist if they recommend to proceed with upfront surgery, will coordinate needle guided partial mastectomy and sentinel node biopsy.  Malignant neoplasm of upper-outer quadrant of right female breast, unspecified estrogen receptor status (CMS-HCC) [C50.411]  PLAN: 1. Right Needle guided partial mastectomy with sentinel lymph node biopsy (19301, [id number]) 2. CBC, CMP 3. Avoid taking aspirin 5 days before surgery 4. Expect appointment with Oncologist on 01/31/2019 5. Contact us if has any question or concern.   Patient and her daughter verbalized understanding, all questions were answered, and were agreeable with the plan outlined above.   This encounter was more than 60 minutes most of the time counseling the patient and coordinating plan of care.  Shayonna Ocampo Cintron-Diaz, MD  Electronically signed by Laureen Frederic Cintron-Diaz, MD  

## 2019-01-31 ENCOUNTER — Encounter: Payer: Self-pay | Admitting: Hematology and Oncology

## 2019-01-31 ENCOUNTER — Inpatient Hospital Stay: Payer: Medicare HMO

## 2019-01-31 ENCOUNTER — Other Ambulatory Visit: Payer: Self-pay

## 2019-01-31 ENCOUNTER — Inpatient Hospital Stay: Payer: Medicare HMO | Attending: Hematology and Oncology | Admitting: Hematology and Oncology

## 2019-01-31 ENCOUNTER — Encounter
Admission: RE | Admit: 2019-01-31 | Discharge: 2019-01-31 | Disposition: A | Discharge status: Home / Self Care | Payer: Medicare HMO | Source: Ambulatory Visit | Attending: General Surgery | Admitting: General Surgery

## 2019-01-31 VITALS — BP 162/78 | HR 69 | Temp 98.0°F | Resp 18 | Ht 63.0 in | Wt 216.1 lb

## 2019-01-31 DIAGNOSIS — Z8041 Family history of malignant neoplasm of ovary: Secondary | ICD-10-CM | POA: Insufficient documentation

## 2019-01-31 DIAGNOSIS — C50411 Malignant neoplasm of upper-outer quadrant of right female breast: Secondary | ICD-10-CM | POA: Insufficient documentation

## 2019-01-31 DIAGNOSIS — I10 Essential (primary) hypertension: Secondary | ICD-10-CM | POA: Diagnosis not present

## 2019-01-31 DIAGNOSIS — I491 Atrial premature depolarization: Secondary | ICD-10-CM | POA: Insufficient documentation

## 2019-01-31 DIAGNOSIS — Z01810 Encounter for preprocedural cardiovascular examination: Secondary | ICD-10-CM | POA: Diagnosis not present

## 2019-01-31 DIAGNOSIS — Z803 Family history of malignant neoplasm of breast: Secondary | ICD-10-CM

## 2019-01-31 DIAGNOSIS — Z170 Estrogen receptor positive status [ER+]: Secondary | ICD-10-CM | POA: Diagnosis not present

## 2019-01-31 DIAGNOSIS — Z800 Family history of malignant neoplasm of digestive organs: Secondary | ICD-10-CM | POA: Insufficient documentation

## 2019-01-31 DIAGNOSIS — R748 Abnormal levels of other serum enzymes: Secondary | ICD-10-CM

## 2019-01-31 DIAGNOSIS — C50811 Malignant neoplasm of overlapping sites of right female breast: Secondary | ICD-10-CM | POA: Diagnosis not present

## 2019-01-31 HISTORY — DX: Sleep apnea, unspecified: G47.30

## 2019-01-31 HISTORY — DX: Sarcoidosis, unspecified: D86.9

## 2019-01-31 LAB — HEPATIC FUNCTION PANEL
ALT: 40 U/L (ref 0–44)
AST: 34 U/L (ref 15–41)
Albumin: 4.3 g/dL (ref 3.5–5.0)
Alkaline Phosphatase: 126 U/L (ref 38–126)
Bilirubin, Direct: 0.2 mg/dL (ref 0.0–0.2)
Indirect Bilirubin: 0.6 mg/dL (ref 0.3–0.9)
Total Bilirubin: 0.8 mg/dL (ref 0.3–1.2)
Total Protein: 7.5 g/dL (ref 6.5–8.1)

## 2019-01-31 NOTE — Patient Instructions (Addendum)
Your procedure is scheduled on: 02/06/2019 [facility] @ 8:15 am  Remember: Instructions that are not followed completely may result in serious medical risk, up to and including death, or upon the discretion of your surgeon and anesthesiologist your surgery may need to be rescheduled.    _x___ 1. Do not eat food after midnight the night before your procedure. You may drink clear liquids up to 2 hours before you are scheduled to arrive at the hospital for your procedure.  Do not drink clear liquids within 2 hours of your scheduled arrival to the hospital.  Clear liquids include  --Water or Apple juice without pulp  --Clear carbohydrate beverage such as ClearFast or Gatorade  --Black Coffee or Clear Tea (No milk, no creamers, do not add anything to                  the coffee or Tea Type 1 and type 2 diabetics should only drink water.   ____Ensure clear carbohydrate drink on the way to the hospital for bariatric patients  ____Ensure clear carbohydrate drink 3 hours before surgery.   No gum chewing or hard candies.     __x__ 2. No Alcohol for 24 hours before or after surgery.   __x__3. No Smoking or e-cigarettes for 24 prior to surgery.  Do not use any chewable tobacco products for at least 6 hour prior to surgery   ____  4. Bring all medications with you on the day of surgery if instructed.    __x__ 5. Notify your doctor if there is any change in your medical condition     (cold, fever, infections).    x___6. On the morning of surgery brush your teeth with toothpaste and water.  You may rinse your mouth with mouth wash if you wish.  Do not swallow any toothpaste or mouthwash.   Do not wear jewelry, make-up, hairpins, clips or nail polish.  Do not wear lotions, powders, or perfumes. You may wear deodorant.  Do not shave 48 hours prior to surgery. Men may shave face and neck.  Do not bring valuables to the hospital.    [facility] is not responsible for any belongings or  valuables.               Contacts, dentures or bridgework may not be worn into surgery.  Leave your suitcase in the car. After surgery it may be brought to your room.  For patients admitted to the hospital, discharge time is determined by your                       treatment team.  _  Patients discharged the day of surgery will not be allowed to drive home.  You will need someone to drive you home and stay with you the night of your procedure.    Please read over the following fact sheets that you were given:   [facility] Preparing for Surgery and or MRSA Information   _x___ Take anti-hypertensive listed below, cardiac, seizure, asthma,     anti-reflux and psychiatric medicines. These include:  1. omeprazole (PRILOSEC) 20 MG capsule  2.  3.  4.  5.  6.  ____Fleets enema or Magnesium Citrate as directed.   _x___ Use CHG Soap or sage wipes as directed on instruction sheet   ____ Use inhalers on the day of surgery and bring to hospital day of surgery  ____ Stop Metformin and Janumet 2 days  prior to surgery.    ____ Take 1/2 of usual insulin dose the night before surgery and none on the morning     surgery.   _x___ Follow recommendations from Cardiologist, Pulmonologist or PCP regarding          stopping Aspirin, Coumadin, Plavix ,Eliquis, Effient, or Pradaxa, and Pletal.  X____Stop Anti-inflammatories such as Advil, Aleve, Ibuprofen, Motrin, Naproxen, Naprosyn, Goodies powders or aspirin products. OK to take Tylenol and                          Celebrex.   _x___ Stop supplements until after surgery.  But may continue Vitamin D, Vitamin B,       and multivitamin. Stop vitamin E today.   ____ Bring C-Pap to the hospital.

## 2019-02-01 DIAGNOSIS — C44612 Basal cell carcinoma of skin of right upper limb, including shoulder: Secondary | ICD-10-CM | POA: Diagnosis not present

## 2019-02-01 DIAGNOSIS — D2361 Other benign neoplasm of skin of right upper limb, including shoulder: Secondary | ICD-10-CM | POA: Diagnosis not present

## 2019-02-01 DIAGNOSIS — D229 Melanocytic nevi, unspecified: Secondary | ICD-10-CM | POA: Diagnosis not present

## 2019-02-01 DIAGNOSIS — L821 Other seborrheic keratosis: Secondary | ICD-10-CM | POA: Diagnosis not present

## 2019-02-01 DIAGNOSIS — L820 Inflamed seborrheic keratosis: Secondary | ICD-10-CM | POA: Diagnosis not present

## 2019-02-01 DIAGNOSIS — D485 Neoplasm of uncertain behavior of skin: Secondary | ICD-10-CM | POA: Diagnosis not present

## 2019-02-01 DIAGNOSIS — L814 Other melanin hyperpigmentation: Secondary | ICD-10-CM | POA: Diagnosis not present

## 2019-02-01 LAB — ESTRADIOL: Estradiol: 37.4 pg/mL

## 2019-02-01 LAB — FOLLICLE STIMULATING HORMONE: FSH: 57 m[IU]/mL

## 2019-02-01 LAB — CANCER ANTIGEN 27.29: CA 27.29: 27.8 U/mL (ref 0.0–38.6)

## 2019-02-02 ENCOUNTER — Other Ambulatory Visit
Admission: RE | Admit: 2019-02-02 | Discharge: 2019-02-02 | Disposition: A | Discharge status: Home / Self Care | Payer: Medicare HMO | Source: Ambulatory Visit | Attending: General Surgery | Admitting: General Surgery

## 2019-02-02 ENCOUNTER — Other Ambulatory Visit: Payer: Self-pay

## 2019-02-02 DIAGNOSIS — Z20828 Contact with and (suspected) exposure to other viral communicable diseases: Secondary | ICD-10-CM | POA: Diagnosis not present

## 2019-02-02 DIAGNOSIS — Z01812 Encounter for preprocedural laboratory examination: Secondary | ICD-10-CM | POA: Diagnosis not present

## 2019-02-03 LAB — SARS CORONAVIRUS 2 (TAT 6-24 HRS): SARS Coronavirus 2: NEGATIVE

## 2019-02-05 MED ORDER — CLINDAMYCIN PHOSPHATE 900 MG/50ML IV SOLN
900.0000 mg | INTRAVENOUS | Status: AC
  Administered 2019-02-06: 900 mg via INTRAVENOUS

## 2019-02-06 ENCOUNTER — Encounter
Admission: RE | Disposition: A | Discharge status: Home / Self Care | Payer: Self-pay | Source: Home / Self Care | Attending: General Surgery

## 2019-02-06 ENCOUNTER — Ambulatory Visit
Admission: RE | Admit: 2019-02-06 | Discharge: 2019-02-06 | Disposition: A | Discharge status: Home / Self Care | Payer: Medicare HMO | Attending: General Surgery | Admitting: General Surgery

## 2019-02-06 ENCOUNTER — Other Ambulatory Visit: Payer: Self-pay

## 2019-02-06 ENCOUNTER — Ambulatory Visit: Payer: Medicare HMO | Admitting: Anesthesiology

## 2019-02-06 ENCOUNTER — Ambulatory Visit
Admission: RE | Admit: 2019-02-06 | Discharge: 2019-02-06 | Disposition: A | Discharge status: Home / Self Care | Payer: Medicare HMO | Source: Ambulatory Visit | Attending: General Surgery | Admitting: General Surgery

## 2019-02-06 DIAGNOSIS — E1122 Type 2 diabetes mellitus with diabetic chronic kidney disease: Secondary | ICD-10-CM | POA: Insufficient documentation

## 2019-02-06 DIAGNOSIS — Z79899 Other long term (current) drug therapy: Secondary | ICD-10-CM | POA: Diagnosis not present

## 2019-02-06 DIAGNOSIS — M1990 Unspecified osteoarthritis, unspecified site: Secondary | ICD-10-CM | POA: Insufficient documentation

## 2019-02-06 DIAGNOSIS — I129 Hypertensive chronic kidney disease with stage 1 through stage 4 chronic kidney disease, or unspecified chronic kidney disease: Secondary | ICD-10-CM | POA: Diagnosis not present

## 2019-02-06 DIAGNOSIS — C50411 Malignant neoplasm of upper-outer quadrant of right female breast: Secondary | ICD-10-CM

## 2019-02-06 DIAGNOSIS — F418 Other specified anxiety disorders: Secondary | ICD-10-CM | POA: Diagnosis not present

## 2019-02-06 DIAGNOSIS — G4733 Obstructive sleep apnea (adult) (pediatric): Secondary | ICD-10-CM | POA: Diagnosis not present

## 2019-02-06 DIAGNOSIS — C50911 Malignant neoplasm of unspecified site of right female breast: Secondary | ICD-10-CM | POA: Diagnosis not present

## 2019-02-06 DIAGNOSIS — Z905 Acquired absence of kidney: Secondary | ICD-10-CM | POA: Insufficient documentation

## 2019-02-06 DIAGNOSIS — R0789 Other chest pain: Secondary | ICD-10-CM | POA: Diagnosis not present

## 2019-02-06 DIAGNOSIS — Z803 Family history of malignant neoplasm of breast: Secondary | ICD-10-CM | POA: Diagnosis not present

## 2019-02-06 DIAGNOSIS — E669 Obesity, unspecified: Secondary | ICD-10-CM | POA: Insufficient documentation

## 2019-02-06 DIAGNOSIS — Z6841 Body Mass Index (BMI) 40.0 and over, adult: Secondary | ICD-10-CM | POA: Diagnosis not present

## 2019-02-06 DIAGNOSIS — E7800 Pure hypercholesterolemia, unspecified: Secondary | ICD-10-CM | POA: Insufficient documentation

## 2019-02-06 DIAGNOSIS — Z96653 Presence of artificial knee joint, bilateral: Secondary | ICD-10-CM | POA: Diagnosis not present

## 2019-02-06 DIAGNOSIS — N183 Chronic kidney disease, stage 3 (moderate): Secondary | ICD-10-CM | POA: Insufficient documentation

## 2019-02-06 DIAGNOSIS — Z7982 Long term (current) use of aspirin: Secondary | ICD-10-CM | POA: Diagnosis not present

## 2019-02-06 DIAGNOSIS — Z8041 Family history of malignant neoplasm of ovary: Secondary | ICD-10-CM | POA: Diagnosis not present

## 2019-02-06 DIAGNOSIS — Z85528 Personal history of other malignant neoplasm of kidney: Secondary | ICD-10-CM | POA: Diagnosis not present

## 2019-02-06 DIAGNOSIS — G4730 Sleep apnea, unspecified: Secondary | ICD-10-CM | POA: Diagnosis not present

## 2019-02-06 DIAGNOSIS — N6311 Unspecified lump in the right breast, upper outer quadrant: Secondary | ICD-10-CM | POA: Diagnosis not present

## 2019-02-06 DIAGNOSIS — Z800 Family history of malignant neoplasm of digestive organs: Secondary | ICD-10-CM | POA: Diagnosis not present

## 2019-02-06 DIAGNOSIS — K219 Gastro-esophageal reflux disease without esophagitis: Secondary | ICD-10-CM | POA: Insufficient documentation

## 2019-02-06 DIAGNOSIS — C50811 Malignant neoplasm of overlapping sites of right female breast: Secondary | ICD-10-CM | POA: Diagnosis not present

## 2019-02-06 DIAGNOSIS — Z170 Estrogen receptor positive status [ER+]: Secondary | ICD-10-CM | POA: Diagnosis not present

## 2019-02-06 HISTORY — PX: BREAST LUMPECTOMY: SHX2

## 2019-02-06 HISTORY — PX: PARTIAL MASTECTOMY WITH NEEDLE LOCALIZATION AND AXILLARY SENTINEL LYMPH NODE BX: SHX6009

## 2019-02-06 SURGERY — PARTIAL MASTECTOMY WITH NEEDLE LOCALIZATION AND AXILLARY SENTINEL LYMPH NODE BX
Anesthesia: General | Site: Breast | Laterality: Right

## 2019-02-06 MED ORDER — PROPOFOL 10 MG/ML IV BOLUS
INTRAVENOUS | Status: DC | PRN
  Administered 2019-02-06: 120 mg via INTRAVENOUS

## 2019-02-06 MED ORDER — SUCCINYLCHOLINE CHLORIDE 20 MG/ML IJ SOLN
INTRAMUSCULAR | Status: DC | PRN
  Administered 2019-02-06: 120 mg via INTRAVENOUS

## 2019-02-06 MED ORDER — EPHEDRINE SULFATE 50 MG/ML IJ SOLN
INTRAMUSCULAR | Status: DC | PRN
  Administered 2019-02-06 (×2): 5 mg via INTRAVENOUS
  Administered 2019-02-06: 10 mg via INTRAVENOUS

## 2019-02-06 MED ORDER — ACETAMINOPHEN 10 MG/ML IV SOLN
INTRAVENOUS | Status: AC
  Filled 2019-02-06: qty 100

## 2019-02-06 MED ORDER — OXYCODONE HCL 5 MG PO TABS: 5.0000 mg | ORAL_TABLET | Freq: Once | ORAL | Status: DC | PRN

## 2019-02-06 MED ORDER — FENTANYL CITRATE (PF) 100 MCG/2ML IJ SOLN
INTRAMUSCULAR | Status: AC
  Filled 2019-02-06: qty 2

## 2019-02-06 MED ORDER — GABAPENTIN 300 MG PO CAPS: 300.0000 mg | ORAL_CAPSULE | Freq: Three times a day (TID) | ORAL | 0 refills | Status: DC

## 2019-02-06 MED ORDER — CLINDAMYCIN PHOSPHATE 900 MG/50ML IV SOLN
INTRAVENOUS | Status: AC
  Filled 2019-02-06: qty 50

## 2019-02-06 MED ORDER — ONDANSETRON HCL 4 MG/2ML IJ SOLN
INTRAMUSCULAR | Status: AC
  Filled 2019-02-06: qty 2

## 2019-02-06 MED ORDER — BUPIVACAINE-EPINEPHRINE (PF) 0.5% -1:200000 IJ SOLN
INTRAMUSCULAR | Status: AC
  Filled 2019-02-06: qty 30

## 2019-02-06 MED ORDER — SUGAMMADEX SODIUM 500 MG/5ML IV SOLN
INTRAVENOUS | Status: DC | PRN
  Administered 2019-02-06: 220 mg via INTRAVENOUS

## 2019-02-06 MED ORDER — PROPOFOL 500 MG/50ML IV EMUL
INTRAVENOUS | Status: DC | PRN
  Administered 2019-02-06: 20 ug/kg/min via INTRAVENOUS

## 2019-02-06 MED ORDER — ONDANSETRON HCL 4 MG/2ML IJ SOLN
INTRAMUSCULAR | Status: DC | PRN
  Administered 2019-02-06 (×2): 4 mg via INTRAVENOUS

## 2019-02-06 MED ORDER — MIDAZOLAM HCL 2 MG/2ML IJ SOLN
INTRAMUSCULAR | Status: DC | PRN
  Administered 2019-02-06: 2 mg via INTRAVENOUS

## 2019-02-06 MED ORDER — ROCURONIUM BROMIDE 100 MG/10ML IV SOLN
INTRAVENOUS | Status: DC | PRN
  Administered 2019-02-06: 45 mg via INTRAVENOUS
  Administered 2019-02-06: 5 mg via INTRAVENOUS

## 2019-02-06 MED ORDER — BUPIVACAINE-EPINEPHRINE (PF) 0.5% -1:200000 IJ SOLN
INTRAMUSCULAR | Status: DC | PRN
  Administered 2019-02-06: 30 mL

## 2019-02-06 MED ORDER — PROMETHAZINE HCL 25 MG/ML IJ SOLN: 6.2500 mg | INTRAMUSCULAR | Status: DC | PRN

## 2019-02-06 MED ORDER — MEPERIDINE HCL 50 MG/ML IJ SOLN: 6.2500 mg | INTRAMUSCULAR | Status: DC | PRN

## 2019-02-06 MED ORDER — ACETAMINOPHEN 10 MG/ML IV SOLN
INTRAVENOUS | Status: DC | PRN
  Administered 2019-02-06: 1000 mg via INTRAVENOUS

## 2019-02-06 MED ORDER — METHYLENE BLUE 0.5 % INJ SOLN
INTRAVENOUS | Status: AC
  Filled 2019-02-06: qty 10

## 2019-02-06 MED ORDER — MIDAZOLAM HCL 2 MG/2ML IJ SOLN
INTRAMUSCULAR | Status: AC
  Filled 2019-02-06: qty 2

## 2019-02-06 MED ORDER — TECHNETIUM TC 99M SULFUR COLLOID FILTERED
1.0000 | Freq: Once | INTRAVENOUS | Status: AC | PRN
  Administered 2019-02-06: 0.858 via INTRADERMAL

## 2019-02-06 MED ORDER — SODIUM CHLORIDE 0.9 % IV SOLN
INTRAVENOUS | Status: DC | PRN
  Administered 2019-02-06: 25 ug/min via INTRAVENOUS

## 2019-02-06 MED ORDER — DEXAMETHASONE SODIUM PHOSPHATE 10 MG/ML IJ SOLN
INTRAMUSCULAR | Status: DC | PRN
  Administered 2019-02-06: 10 mg via INTRAVENOUS

## 2019-02-06 MED ORDER — OXYCODONE HCL 5 MG/5ML PO SOLN: 5.0000 mg | Freq: Once | ORAL | Status: DC | PRN

## 2019-02-06 MED ORDER — FENTANYL CITRATE (PF) 100 MCG/2ML IJ SOLN: 25.0000 ug | INTRAMUSCULAR | Status: DC | PRN

## 2019-02-06 MED ORDER — NABUMETONE 500 MG PO TABS: 500.0000 mg | ORAL_TABLET | Freq: Two times a day (BID) | ORAL | 0 refills | Status: AC

## 2019-02-06 MED ORDER — LACTATED RINGERS IV SOLN
INTRAVENOUS | Status: DC
  Administered 2019-02-06 (×2): via INTRAVENOUS

## 2019-02-06 MED ORDER — GLYCOPYRROLATE 0.2 MG/ML IJ SOLN
INTRAMUSCULAR | Status: DC | PRN
  Administered 2019-02-06: 0.2 mg via INTRAVENOUS

## 2019-02-06 MED ORDER — FENTANYL CITRATE (PF) 100 MCG/2ML IJ SOLN
INTRAMUSCULAR | Status: DC | PRN
  Administered 2019-02-06: 25 ug via INTRAVENOUS
  Administered 2019-02-06: 100 ug via INTRAVENOUS
  Administered 2019-02-06: 25 ug via INTRAVENOUS
  Administered 2019-02-06: 50 ug via INTRAVENOUS

## 2019-02-06 MED ORDER — LIDOCAINE HCL (CARDIAC) PF 100 MG/5ML IV SOSY
PREFILLED_SYRINGE | INTRAVENOUS | Status: DC | PRN
  Administered 2019-02-06: 100 mg via INTRAVENOUS

## 2019-02-06 SURGICAL SUPPLY — 48 items
BINDER BREAST LRG (GAUZE/BANDAGES/DRESSINGS) IMPLANT
BINDER BREAST MEDIUM (GAUZE/BANDAGES/DRESSINGS) IMPLANT
BINDER BREAST XLRG (GAUZE/BANDAGES/DRESSINGS) ×3 IMPLANT
BINDER BREAST XXLRG (GAUZE/BANDAGES/DRESSINGS) IMPLANT
BLADE SURG 15 STRL LF DISP TIS (BLADE) ×2 IMPLANT
BLADE SURG 15 STRL SS (BLADE) ×4
CANISTER SUCT 1200ML W/VALVE (MISCELLANEOUS) ×3 IMPLANT
CHLORAPREP W/TINT 26 (MISCELLANEOUS) ×3 IMPLANT
CNTNR SPEC 2.5X3XGRAD LEK (MISCELLANEOUS)
CONT SPEC 4OZ STER OR WHT (MISCELLANEOUS)
CONTAINER SPEC 2.5X3XGRAD LEK (MISCELLANEOUS) IMPLANT
COVER WAND RF STERILE (DRAPES) ×3 IMPLANT
DERMABOND ADVANCED (GAUZE/BANDAGES/DRESSINGS) ×2
DERMABOND ADVANCED .7 DNX12 (GAUZE/BANDAGES/DRESSINGS) ×1 IMPLANT
DEVICE DUBIN SPECIMEN MAMMOGRA (MISCELLANEOUS) ×3 IMPLANT
DRAPE LAPAROTOMY TRNSV 106X77 (MISCELLANEOUS) ×3 IMPLANT
DRSG GAUZE FLUFF 36X18 (GAUZE/BANDAGES/DRESSINGS) IMPLANT
ELECT CAUTERY BLADE 6.4 (BLADE) ×3 IMPLANT
ELECT REM PT RETURN 9FT ADLT (ELECTROSURGICAL) ×3
ELECTRODE REM PT RTRN 9FT ADLT (ELECTROSURGICAL) ×1 IMPLANT
GLOVE BIO SURGEON STRL SZ 6.5 (GLOVE) ×6 IMPLANT
GLOVE BIO SURGEONS STRL SZ 6.5 (GLOVE) ×3
GLOVE BIOGEL PI IND STRL 6.5 (GLOVE) ×1 IMPLANT
GLOVE BIOGEL PI INDICATOR 6.5 (GLOVE) ×2
GOWN STRL REUS W/ TWL LRG LVL3 (GOWN DISPOSABLE) ×3 IMPLANT
GOWN STRL REUS W/TWL LRG LVL3 (GOWN DISPOSABLE) ×6
KIT TURNOVER KIT A (KITS) ×3 IMPLANT
LABEL OR SOLS (LABEL) IMPLANT
MARGIN MAP 10MM (MISCELLANEOUS) ×3 IMPLANT
NEEDLE HYPO 22GX1.5 SAFETY (NEEDLE) ×3 IMPLANT
NEEDLE HYPO 25X1 1.5 SAFETY (NEEDLE) ×3 IMPLANT
PACK BASIN MINOR ARMC (MISCELLANEOUS) ×3 IMPLANT
RETRACTOR RING XSMALL (MISCELLANEOUS) ×1 IMPLANT
RTRCTR WOUND ALEXIS 13CM XS SH (MISCELLANEOUS) ×3
SLEVE PROBE SENORX GAMMA FIND (MISCELLANEOUS) IMPLANT
SUT ETHILON 3-0 FS-10 30 BLK (SUTURE) ×6
SUT MNCRL 4-0 (SUTURE) ×4
SUT MNCRL 4-0 27XMFL (SUTURE) ×2
SUT PROLENE 2 0 FS (SUTURE) ×6 IMPLANT
SUT PROLENE 3 0 SH DA (SUTURE) ×3 IMPLANT
SUT SILK 2 0 SH (SUTURE) ×3 IMPLANT
SUT VIC AB 3-0 SH 27 (SUTURE) ×4
SUT VIC AB 3-0 SH 27X BRD (SUTURE) ×2 IMPLANT
SUTURE EHLN 3-0 FS-10 30 BLK (SUTURE) ×2 IMPLANT
SUTURE MNCRL 4-0 27XMF (SUTURE) ×2 IMPLANT
SYR 10ML LL (SYRINGE) ×6 IMPLANT
SYR BULB IRRIG 60ML STRL (SYRINGE) ×3 IMPLANT
WATER STERILE IRR 1000ML POUR (IV SOLUTION) ×3 IMPLANT

## 2019-02-06 NOTE — Op Note (Signed)
Preoperative diagnosis: Right breast carcinoma.  Postoperative diagnosis: Right breast carcinoma.   Procedure: Right needle-localized partial mastectomy.                       Right Axillary Sentinel Lymph node biopsy  Anesthesia: GETA  Surgeon: Dr. Windell Moment  Wound Classification: Clean  Indications: Patient is a 68 y.o. female with a nonpalpable right breast mass noted on mammography with core biopsy demonstrating invasive mammary carcinoma requires needle-localized partial mastectomy for treatment with sentinel lymph node biopsy.   Findings: 1. Specimen mammography shows marker and wire on specimen 2. Pathology call refers gross examination of margins was close to the superior and medial margin 3. No other palpable mass or lymph node identified.    Description of procedure: Preoperative needle localization was performed by radiology. In the nuclear medicine suite, the subareolar region was injected with Tc-99 sulfur colloid. Localization studies were reviewed. The patient was taken to the operating room and placed supine on the operating table, and after general anesthesia the right chest and axilla were prepped and draped in the usual sterile fashion. A time-out was completed verifying correct patient, procedure, site, positioning, and implant(s) and/or special equipment prior to beginning this procedure.  By comparing the localization studies with the direction and skin entry site of the needle, the probable trajectory and location of the mass was visualized. A circumareolar skin incision was planned in such a way as to minimize the amount of dissection to reach the mass.  The skin incision was made. Flaps were raised and the location of the wire confirmed. The wire was delivered into the wound. A 2-0 silk figure-of-eight stay suture was placed around the wire and used for retraction. Dissection was then taken down circumferentially, taking care to include the entire localizing needle  and a wide margin of grossly normal tissue. The specimen and entire localizing wire were removed. The specimen was oriented and sent to radiology with the localization studies. Confirmation was received that the entire target lesion had been resected. Due to the proximity of the surgical area to the axillary tissue, the axilla was accessed through the same incision.  A hand-held gamma probe was used to identify the location of the hottest spot in the axilla.  Dissection was carried down until subdermal facias was advanced. The probe was placed and again, the point of maximal count was found. Dissection continue until nodule was identified. The probe was placed in contact with the node.  The node was excised in its entirety. Ex vivo, the node measured 2863 counts when placed on the probe. Two additional hot spot was detected and the node was excised in similar fashion. The following counts registered respectively: [id number] and 1221 ex vivo node, and 10 residual bed. No additional hot spots were identified. No clinically abnormal nodes were palpated. The procedure was terminated. Hemostasis was achieved and the axillary wound closed in layers with deep interrupted 3-0 Vicryl and skin was closed with subcuticular suture of Monocryl 3-0.  The breast wound was irrigated. Hemostasis was checked. The wound was closed with interrupted sutures of 3-0 Vicryl and a subcuticular suture of Monocryl 4-0. No attempt was made to close the dead space. A dressing was applied.  The patient tolerated the procedure well and was taken to the postanesthesia care unit in stable condition.   Specimen: Right Breast mass  Sentinel Lymph nodes #1, #2, #3                   Anterior margin                   Superior margin                   Medial margin  Complications: None  Estimated Blood Loss: 25 mL

## 2019-02-06 NOTE — Interval H&P Note (Signed)
Patient was evaluated at the pre procedure area. Patient comes today with the diagnosis of invasive mammary carcinoma of the right breast. Patient had needle guided procedure this morning and injection of Technetium in the nuclear medicine area. Patient was again oriented about the procedure Needle guided partial mastectomy with sentinel lymph node biopsy. Patient oriented about the alternatives of treatment and the risk of surgery. Patient agreed to proceed with surgical management. Right breast marked in the pre procedure area.

## 2019-02-06 NOTE — Transfer of Care (Signed)
Immediate Anesthesia Transfer of Care Note  Patient: Jill Bond  Procedure(s) Performed: PARTIAL MASTECTOMY WITH NEEDLE LOCALIZATION AND AXILLARY SENTINEL LYMPH NODE BX (Right Breast)  Patient Location: PACU  Anesthesia Type:General  Level of Consciousness: drowsy and patient cooperative  Airway & Oxygen Therapy: Patient Spontanous Breathing and Patient connected to face mask oxygen  Post-op Assessment: Report given to RN and Post -op Vital signs reviewed and stable  Post vital signs: Reviewed and stable  Last Vitals:  Vitals Value Taken Time  BP 113/68 02/06/19 1432  Temp 37.1 C 02/06/19 1432  Pulse 71 02/06/19 1437  Resp 12 02/06/19 1437  SpO2 99 % 02/06/19 1437  Vitals shown include unvalidated device data.  Last Pain:  Vitals:   02/06/19 0949  TempSrc: Tympanic  PainSc: 6          Complications: No apparent anesthesia complications

## 2019-02-06 NOTE — Anesthesia Post-op Follow-up Note (Signed)
Anesthesia QCDR form completed.        

## 2019-02-06 NOTE — Anesthesia Preprocedure Evaluation (Signed)
Anesthesia Evaluation  Patient identified by MRN, date of birth, ID band Patient awake    Reviewed: Allergy & Precautions, NPO status , Patient's Chart, lab work & pertinent test results  History of Anesthesia Complications (+) PONV and history of anesthetic complications  Airway Mallampati: I  TM Distance: >3 FB Neck ROM: Full    Dental  (+) Missing   Pulmonary sleep apnea and Continuous Positive Airway Pressure Ventilation , neg COPD,    breath sounds clear to auscultation- rhonchi (-) wheezing      Cardiovascular hypertension, Pt. on medications (-) CAD, (-) Past MI, (-) Cardiac Stents and (-) CABG  Rhythm:Regular Rate:Normal - Systolic murmurs and - Diastolic murmurs    Neuro/Psych  Headaches, neg Seizures PSYCHIATRIC DISORDERS Anxiety Depression    GI/Hepatic Neg liver ROS, GERD  ,  Endo/Other  negative endocrine ROSneg diabetes  Renal/GU Renal InsufficiencyRenal disease (s/p nephrectomy)     Musculoskeletal  (+) Arthritis ,   Abdominal (+) + obese,   Peds  Hematology negative hematology ROS (+)   Anesthesia Other Findings Past Medical History: No date: Anxiety No date: Arthritis 2006: Chronic kidney disease     Comment:  Left Nephrectomy No date: Complication of anesthesia     Comment:  PONV No date: Constipation No date: Depression No date: GERD (gastroesophageal reflux disease) No date: Headache No date: Hypertension No date: PONV (postoperative nausea and vomiting) No date: Rotator cuff disorder     Comment:  LEFT No date: Sarcoidosis No date: Shortness of breath dyspnea     Comment:  with exertion No date: Sleep apnea   Reproductive/Obstetrics                             Anesthesia Physical Anesthesia Plan  ASA: III  Anesthesia Plan: General   Post-op Pain Management:    Induction: Intravenous  PONV Risk Score and Plan: 3 and Ondansetron, Dexamethasone and  Propofol infusion  Airway Management Planned: Oral ETT  Additional Equipment:   Intra-op Plan:   Post-operative Plan: Extubation in OR  Informed Consent: I have reviewed the patients History and Physical, chart, labs and discussed the procedure including the risks, benefits and alternatives for the proposed anesthesia with the patient or authorized representative who has indicated his/her understanding and acceptance.     Dental advisory given  Plan Discussed with: CRNA and Anesthesiologist  Anesthesia Plan Comments:         Anesthesia Quick Evaluation

## 2019-02-06 NOTE — OR Nursing (Signed)
Patient is complaining of chest heavy and tight.  Dr. Rosey Bath notified and he order EKG which was normal and cleared patient to go to post op

## 2019-02-06 NOTE — Discharge Instructions (Addendum)
°  Diet: Resume home heart healthy regular diet.   Activity: Increase activity as tolerated. Light activity and walking are encouraged. Do not drive or drink alcohol if taking narcotic pain medications.  Wound care: My shower with soapy water and pat dry (do not rub incisions), but no baths or submerging incision underwater until follow-up. (no swimming)   Medications: Resume all home medications. For mild to moderate pain: acetaminophen (Tylenol) or ibuprofen (if no kidney disease). Combining Tylenol with alcohol can substantially increase your risk of causing liver disease.   Call office [phone]) at any time if any questions, worsening pain, fevers/chills, bleeding, drainage from incision site, or other concerns.     AMBULATORY SURGERY  DISCHARGE INSTRUCTIONS   1) The drugs that you were given will stay in your system until tomorrow so for the next 24 hours you should not:  A) Drive an automobile B) Make any legal decisions C) Drink any alcoholic beverage   2) You may resume regular meals tomorrow.  Today it is better to start with liquids and gradually work up to solid foods.  You may eat anything you prefer, but it is better to start with liquids, then soup and crackers, and gradually work up to solid foods.   3) Please notify your doctor immediately if you have any unusual bleeding, trouble breathing, redness and pain at the surgery site, drainage, fever, or pain not relieved by medication.    4) Additional Instructions:        Please contact your physician with any problems or Same Day Surgery at [phone], Monday through Friday 6 am to 4 pm, or [place] at [facility] number at [phone].

## 2019-02-06 NOTE — Anesthesia Procedure Notes (Signed)
Procedure Name: Intubation Date/Time: 02/06/2019 12:03 PM Performed by: Hedda Slade, CRNA Pre-anesthesia Checklist: Patient identified, Patient being monitored, Timeout performed, Emergency Drugs available and Suction available Patient Re-evaluated:Patient Re-evaluated prior to induction Oxygen Delivery Method: Circle system utilized Preoxygenation: Pre-oxygenation with 100% oxygen Induction Type: IV induction Ventilation: Mask ventilation without difficulty Laryngoscope Size: 3 and McGraph Grade View: Grade I Tube type: Oral Tube size: 7.0 mm Number of attempts: 1 Airway Equipment and Method: Stylet Placement Confirmation: ETT inserted through vocal cords under direct vision,  positive ETCO2 and breath sounds checked- equal and bilateral Secured at: 21 cm Tube secured with: Tape Dental Injury: Teeth and Oropharynx as per pre-operative assessment

## 2019-02-07 ENCOUNTER — Encounter: Payer: Self-pay | Admitting: General Surgery

## 2019-02-07 NOTE — Anesthesia Postprocedure Evaluation (Signed)
Anesthesia Post Note  Patient: Jill Bond  Procedure(s) Performed: PARTIAL MASTECTOMY WITH NEEDLE LOCALIZATION AND AXILLARY SENTINEL LYMPH NODE BX (Right Breast)  Patient location during evaluation: PACU Anesthesia Type: General Level of consciousness: awake and alert Pain management: pain level controlled Vital Signs Assessment: post-procedure vital signs reviewed and stable Respiratory status: spontaneous breathing, nonlabored ventilation, respiratory function stable and patient connected to nasal cannula oxygen Cardiovascular status: blood pressure returned to baseline and stable Postop Assessment: no apparent nausea or vomiting Anesthetic complications: no     Last Vitals:  Vitals:   02/06/19 1525 02/06/19 1548  BP: 130/64 129/68  Pulse: 70 64  Resp: 16 16  Temp: 36.5 C   SpO2: 93% 95%    Last Pain:  Vitals:   02/07/19 0806  TempSrc:   PainSc: 6                  Martha Clan

## 2019-02-13 ENCOUNTER — Other Ambulatory Visit: Payer: Self-pay | Admitting: Hematology and Oncology

## 2019-02-13 DIAGNOSIS — C50411 Malignant neoplasm of upper-outer quadrant of right female breast: Secondary | ICD-10-CM

## 2019-02-13 DIAGNOSIS — Z170 Estrogen receptor positive status [ER+]: Secondary | ICD-10-CM

## 2019-02-13 LAB — SURGICAL PATHOLOGY

## 2019-02-15 ENCOUNTER — Other Ambulatory Visit: Payer: Self-pay

## 2019-02-17 ENCOUNTER — Encounter: Payer: Self-pay | Admitting: Hematology and Oncology

## 2019-02-17 NOTE — Progress Notes (Signed)
Patient c/o muscle spasms to bilateral legs and hips. The patient Name and DOB has been verified by phone today.

## 2019-02-19 NOTE — Progress Notes (Signed)
[facility]  [address] [place], [place] [address] Phone: [phone]  Fax: [phone]   Clinic Day:  02/20/2019  Referring physician: Baxter Hire, MD  Chief Complaint: Jill Bond is a 68 y.o. female with sarcoid and stage IA right breast cancer who is seen for assessment following lumpectomy and sentinel node biopsy for discussion regarding direction of therapy.    HPI: The patient was last seen in the medical oncology clinic on 01/31/2019 for initial consultation.  Right breast biopsy revealed invasive mammary carcinoma.  Symptomatically, she noted chronic back pain x 2 years.  She had chronic respiratory symptoms.  Exam revealed post biopsy changes. Labs revealed normal LFTs.  CA 27.29 was 27.8.  Estradiol was 37.4 and FSH of 57.0 c/w post-menopausal status.  She underwent lumpectomy on 02/06/2019 by Dr. Herbert Pun.  Pathology [phone]) revealed a 1.2 cm grade I invasive carcinoma of no special type.  Low grade DCIS was present.  Closest margin was 4 mm. One of three sentinel lymph nodes were positive for micro metastatic carcinoma (> 0.2 to 2 mm). Margins are negative for atypia and malignancy.  Pathologic stage was pT1c pN18m.  She has an appointment with Dr. CWindell Momentscheduled for 02/21/2019.  Bone density study is scheduled for 02/22/2019.   During the interim, she has had pain and warmth in her right breast since 02/15/2019 or 02/16/2019. She was told to ice it by the surgical office. She recently went camping and is unsure if she irritated the area through overuse of arm.  She denies any fever.  She returns to the dermatologist on 03/01/2019 to have a "cancerous spot" removed.    Past Medical History:  Diagnosis Date   Anxiety    Arthritis    Chronic kidney disease 2006   Left Nephrectomy   Complication of anesthesia    PONV   Constipation    Depression    GERD (gastroesophageal reflux disease)      Headache    Hypertension    PONV (postoperative nausea and vomiting)    Rotator cuff disorder    LEFT   Sarcoidosis    Shortness of breath dyspnea    with exertion   Sleep apnea     Past Surgical History:  Procedure Laterality Date   ABDOMINAL HYSTERECTOMY     PARTIAL   ACHILLES TENDON SURGERY Left 02/22/2015   Procedure: Secondary ACHILLES TENDON REPAIR;  Surgeon: MAlbertine Patricia DPM;  Location: [facility] ORS;  Service: Podiatry;  Laterality: Left;   Bladder tac     BREAST BIOPSY Right 01/23/2019   affirm stereo bx of calcs with x marker, path pending   BREAST BIOPSY Right 01/23/2019   uKoreabx of mass with coil marker, path pending   BREAST EXCISIONAL BIOPSY Left 2003   neg   COLONOSCOPY  2013   JOINT REPLACEMENT Left 2007   Total Knee Replacement   KIDNEY CYST REMOVAL Left    OSTECTOMY Left 02/22/2015   Procedure: OSTECTOMY;  Surgeon: MAlbertine Patricia DPM;  Location: [facility] ORS;  Service: Podiatry;  Laterality: Left;   PARTIAL MASTECTOMY WITH NEEDLE LOCALIZATION AND AXILLARY SENTINEL LYMPH NODE BX Right 02/06/2019   Procedure: PARTIAL MASTECTOMY WITH NEEDLE LOCALIZATION AND AXILLARY SENTINEL LYMPH NODE BX;  Surgeon: CHerbert Pun MD;  Location: [facility] ORS;  Service: General;  Laterality: Right;   SHOULDER ARTHROSCOPY W/ ROTATOR CUFF REPAIR Left YRS AGO   TONSILLECTOMY  AGE 89   TOTAL KNEE ARTHROPLASTY Right 10/22/2015  Procedure: RIGHT TOTAL KNEE ARTHROPLASTY;  Surgeon: Paralee Cancel, MD;  Location: [facility];  Service: Orthopedics;  Laterality: Right;   TOTAL KNEE ARTHROPLASTY Right    10/22/2015    Family History  Problem Relation Age of Onset   Breast cancer Mother 63   Heart failure Father    Prostate cancer Father     Social History:  reports that she has never smoked. She has never used smokeless tobacco. She reports that she does not drink alcohol or use drugs. She denies any exposure to radiation or toxins. 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 Her husband died on 2013-09-14. Her first cousin is Electronics engineer.Her daughter, [name], is her medical power of attorney. The patient is accompanied by her cousin, Tonette Bihari, via telephone [phone]) today.  Allergies:  Allergies  Allergen Reactions   Codeine Nausea And Vomiting   Penicillins Hives and Other (See Comments)    Has patient had a PCN reaction causing immediate rash, facial/tongue/throat swelling, SOB or lightheadedness with hypotension: no Has patient had a PCN reaction causing severe rash involving mucus membranes or skin necrosis: no Has patient had a PCN reaction that required hospitalization no Has patient had a PCN reaction occurring within the last 10 years: no If all of the above answers are "NO", then may proceed with Cephalosporin use.     Current Medications: Current Outpatient Medications  Medication Sig Dispense Refill   amLODipine (NORVASC) 10 MG tablet Take 10 mg by mouth at bedtime.     docusate sodium (COLACE) 100 MG capsule Take 200 mg by mouth at bedtime.     methotrexate (RHEUMATREX) 2.5 MG tablet TAKE 4 TABLETS (10 MG TOTAL) BY MOUTH ONCE A WEEK. CAUTION:CHEMOTHERAPY. PROTECT FROM LIGHT. 52 tablet 2   metoprolol succinate (TOPROL-XL) 100 MG 24 hr tablet Take 100 mg by mouth at bedtime.      OMEGA-3 FATTY ACIDS PO Take 1,000 mg by mouth daily.      omeprazole (PRILOSEC) 20 MG capsule Take 20 mg by mouth daily.      diclofenac sodium (VOLTAREN) 1 % GEL Apply 1 application topically 2 (two) times daily as needed (foot pain).     gabapentin (NEURONTIN) 300 MG capsule Take 1 capsule (300 mg total) by mouth 3 (three) times daily for 10 days. 30 capsule 0   Multiple Vitamin (MULTIVITAMIN WITH MINERALS) TABS tablet Take 0.5 tablets by mouth daily.      oxycodone (OXY-IR) 5 MG capsule TAKE 1 CAPSULE (5 MG TOTAL) BY MOUTH EVERY 4 (FOUR) HOURS AS NEEDED FOR PAIN FOR UP TO 5 DAYS     vitamin E 400 UNIT capsule Take 400 mg by mouth  daily.     No current facility-administered medications for this visit.     Review of Systems  Constitutional: Negative for chills, diaphoresis, fever, malaise/fatigue and weight loss (up 22 pounds?).       Feels "all right".  HENT: Positive for congestion. Negative for ear discharge, ear pain, hearing loss, nosebleeds, sinus pain and sore throat.        Chronic rhinorrhea and sneezing.  Eyes: Positive for pain. Negative for blurred vision and double vision.       Leaky eyes. Floaters.  Respiratory: Positive for cough and wheezing (on exertion). Negative for hemoptysis, sputum production and shortness of breath.        Sleep apnea on CPAP at night.  Cardiovascular: Positive for chest pain (upper right side). Negative for palpitations,  orthopnea, claudication and leg swelling.  Gastrointestinal: Negative for abdominal pain, blood in stool, constipation, diarrhea, melena, nausea and vomiting.  Genitourinary: Negative for dysuria, frequency, hematuria and urgency.  Musculoskeletal: Positive for back pain (chronic x 2 years). Negative for falls, joint pain, myalgias and neck pain.       Leg and feet cramps.  Skin: Negative for itching and rash.       Right breast red, warm, and tender at site of surgery.  Neurological: Negative for dizziness, tingling, sensory change, weakness and headaches (resolved).  Endo/Heme/Allergies: Does not bruise/bleed easily.  Psychiatric/Behavioral: Positive for memory loss (3 months ago, improving). Negative for depression. The patient is not nervous/anxious and does not have insomnia.   All other systems reviewed and are negative.   Performance status (ECOG): 1  Vitals Blood pressure (!) 157/73, pulse 68, temperature 98.1 F (36.7 C), temperature source Oral, resp. rate 18, weight 238 lb 3.3 oz (108 kg), SpO2 100 %.   Physical Exam  Constitutional: She is oriented to person, place, and time. She appears well-developed and well-nourished. No distress. Face  mask in place.  HENT:  Head: Normocephalic and atraumatic.  Mouth/Throat: Oropharynx is clear and moist. No oropharyngeal exudate.  Shoulder length brown graying hair.  Mask.  Eyes: Pupils are equal, round, and reactive to light. Conjunctivae and EOM are normal. No scleral icterus.  Glasses.  Neck: Normal range of motion. Neck supple. No JVD present.  Cardiovascular: Normal rate, regular rhythm and normal heart sounds.  No murmur heard. Pulmonary/Chest: Effort normal and breath sounds normal. No respiratory distress. She has no wheezes. She has no rales. She exhibits no tenderness.  Post operative changes right breast with increased warmth, tenderness, and erythema extending from incision.  Abdominal: Soft. Bowel sounds are normal. She exhibits no distension and no mass. There is no hepatosplenomegaly. There is no rebound and no guarding.  Small old bruise overlying the upper abdomen.  Musculoskeletal: Normal range of motion.        General: No edema.  Lymphadenopathy:    She has no cervical adenopathy.    She has no axillary adenopathy.       Right: No supraclavicular adenopathy present.       Left: No supraclavicular adenopathy present.  Neurological: She is alert and oriented to person, place, and time.  Skin: Skin is warm and dry. No rash noted. She is not diaphoretic. No erythema. No pallor.  Psychiatric: She has a normal mood and affect. Her behavior is normal. Judgment and thought content normal.  Nursing note and vitals reviewed.     Right breast   No visits with results within 3 Day(s) from this visit.  Latest known visit with results is:  Admission on 02/06/2019, Discharged on 02/06/2019  Component Date Value Ref Range Status   SURGICAL PATHOLOGY 02/06/2019    Final-Edited                   Value:SURGICAL PATHOLOGY CASE: [id number] PATIENT: [facility] Surgical Pathology Report    Specimen Submitted: A. Breast mass, right B. Sentinel lymph node 1, right  axillary C. Sentinel lymph node 2, right axillary D. Sentinel lymph node 3, right axillary E. Breast, right, ant margin, short stitch sup F. Breast, right, sup margin, short stitch ant G. Breast, right, med margin, short stitch sup  Clinical History: C50.411 - malignant neoplasm of upper-outer quadrant of RT female breast, unspecified estrogen receptor   DIAGNOSIS: A.  BREAST MASS, RIGHT; NEEDLE LOCALIZED  PARTIAL MASTECTOMY: - INVASIVE MAMMARY CARCINOMA. - DUCTAL CARCINOMA IN SITU. - SEE CANCER SUMMARY BELOW. - BIOPSY SITE CHANGE WITH METALLIC CLIP.  B.  SENTINEL LYMPH NODE 1, RIGHT AXILLARY; EXCISION: - ONE LYMPH NODE WITH 1.2 MM FOCUS OF MICRO METASTATIC CARCINOMA. - DEEPER SECTIONS AND IHC SLIDES EXAMINED.  C.  SENTINEL LYMPH NODE 2, RIGHT AXILLARY; EXCISION: - ONE LYMPH NODE NEGA                         TIVE FOR MALIGNANCY (0/1). - DEEPER SECTIONS EXAMINED.  D.  SENTINEL LYMPH NODE 3, RIGHT AXILLARY; EXCISION: - ONE LYMPH NODE NEGATIVE FOR MALIGNANCY (0/1).  E.  BREAST ANTERIOR MARGIN, RIGHT; EXCISION: - BENIGN BREAST TISSUE WITH USUAL DUCTAL HYPERPLASIA AND AREA OF PRIOR BIOPSY SITE CHANGE. - NEGATIVE FOR ATYPIA AND MALIGNANCY.  F.  BREAST SUPERIOR MARGIN, RIGHT; EXCISION: - BENIGN BREAST TISSUE WITH AREA OF PRIOR BIOPSY SITE CHANGE. - NEGATIVE FOR ATYPIA AND MALIGNANCY.  G.  BREAST MEDIAL MARGIN, RIGHT; EXCISION: - BENIGN BREAST TISSUE WITH USUAL DUCTAL HYPERPLASIA AND AREA OF PRIOR BIOPSY SITE CHANGE. - NEGATIVE FOR ATYPIA AND MALIGNANCY.  CANCER CASE SUMMARY: INVASIVE CARCINOMA OF THE BREAST Procedure: Needle localized partial mastectomy Specimen Laterality: Right Tumor Size: 12 mm Histologic Type: Invasive carcinoma of no special type (ductal) Histologic Grade (Nottingham Histologic Score)      Glandular (Acinar)/Tubular Differentiation: 1                                       Nuclear Pleomorphism: 2              Mitotic Rate: 1              Overall  Grade: 1 Ductal Carcinoma In Situ (DCIS): Present, low-grade      Negative for extensive intraductal component (EIC) Margins:              Invasive Carcinoma Margins: Uninvolved by invasive carcinoma                      Distance from closest margin: 4 mm                      Specify closest margin: Posterior              DCIS Margins: Uninvolved by DCIS                      Distance from closest margin: 8 mm                      Specify closest margin: Posterior Regional Lymph Nodes: Involved by tumor cells              Number of Lymph Nodes with Macrometastases (>2 mm): 0              Number of Lymph Nodes with Micrometastases (>0.2 mm to 2 mm): 1              Number of Lymph Nodes with Isolated Tumor Cells (=0.2 mm or =200 cells): 0              Size of Largest Metastatic Deposit: 1.2 mm              Extranodal Extension: Not identified  Total Number of Lymph Nodes Examined: 3                                       Number of Sentinel Nodes Examined: 3 Treatment Effect in the Breast: No known presurgical therapy Lymphovascular Invasion: Present Pathologic Stage Classification (pTNM, AJCC 8th Edition): pT1c pN74m (sn) Breast Biomarker Testing Performed on Previous Biopsy: [id number]     Estrogen Receptor (ER) Status: Positive      Progesterone Receptor (PgR) Status: Positive      HER2 (by immunohistochemistry): Negative  Comment: Prior biopsy site change is present in the inferior and lateral aspects of the re-excised superior and medial margins respectively. A limited panel of immunohistochemical stains was performed on part B to characterize the origin of metastatic carcinoma. The micro metastatic carcinoma is positive for GATA-3 and is negative for PAX-8 and TTF-1. This pattern of staining supports the above diagnosis.  IHC slides [facility], [place]. All controls stained appropriately.  This test was d                          eveloped and its performance characteristics determined by [facility]. It has not been cleared or approved by the [place] and Drug Administration. The FDA does not require this test to go through premarket FDA review. This test is used for clinical purposes. It should not be regarded as investigational or for research. This laboratory is certified under the Clinical Laboratory Improvement Amendments (CLIA) as qualified to perform high complexity clinical laboratory testing.  GROSS DESCRIPTION: Intraoperative Consultation:     Labeled: Right breast mass     Received: Fresh at 12:57 PM     Specimen: Partial mastectomy with needle localized     Pathologic evaluation performed: Gross margin evaluation     Diagnosis: Mass with clip.  Involves superior margin in medial aspect of the specimen     Communicated to: Dr. CWindell Momentby Dr. TQuay Burowat 10 7 PM on 02/06/2019     Tissue submitted: Gross evaluation only A. Labeled: Right breast mass Received: Fresh Accompanying                          specimen radiograph: Yes Radiographic findings: Needle localized metallic J-wire and metallic biopsy clip at L8 Time in fixative: Tissue procedure time  Cold ischemic time: 26 minutes Total fixation time: 9 hours Type of procedure: Needle localized partial mastectomy Location / laterality of specimen: Right breast upper outer quadrant, 9:30 o'clock position and 7 cm from nipple Orientation of specimen: 3 metallic tags: Cranial, lateral, deep Inking: Superior = blue Inferior = green Medial = yellow Lateral = orange Posterior = black Anterior/Superficial = red Size of specimen: 6.5 cm from anterior to posterior, 5.0 cm from superior to inferior, 6.5 cm from medial to lateral Skin: Not present Biopsy site: Coiling shape metallic biopsy clip identified in the center of the tumor Number of discrete masses: one Size of mass(es): 1.0 x 0.8 x 0.7 cm Description of  mass(es): At the medial aspect of the specimen there is a speculated firm tumor surrounded by fibrotic ti                         ssue which is adjacent  to the superior margin. Margins: Lateral margin is 3.5 cm away from the tumor, medial margin is 0.9 cm away from tumor, possibly adjacent to the superior margin, 3.0 cm away from inferior surgical margin, 1.2 cm away from anterior surgical margin, 0.9 away cm from posterior surgical margin Description of remainder of tissue: Grossly unremarkable fibrofatty tissue with minor fibrotic changes (less than 3% of entire tissue) Note: The tumor and adjacent to the tumor tissue is entirely submitted  Block summary: 1-lateral surgical margin 2-3-medial surgical margin 4-8-superior surgical margin 9-10-inferior surgical margin 11-13-posterior surgical margin 14-16-anterior surgical margin  B. Labeled: Right axillary sentinel lymph node #1 Received: In formalin Tissue fragment(s): 1 Size: 1.1 x 0.8 x 0.7 cm Description: Single fatty soft lymph node Entirely submitted in 1 cassette bisected lymph node.  C.  Labeled: Right axillary sentinel lymph node                          #2 Received: In formalin Tissue fragment(s): 1 Size: 1.0 x 0.8 x 0.7 cm Description: Single fatty soft lymph node Entirely submitted in 1 cassette bisected lymph node.  D. Labeled: Right axillary sentinel lymph node #3 Received: In formalin Tissue fragment(s): 1 Size: 0.7 x 0.7 x 0.4 cm Description: Single fatty soft lymph node Entirely submitted in 1 cassette bisected lymph node.  E. Labeled: Right breast anterior margin Received: In formalin Oriented: Short stitch superior, long stitch lateral, black stitch inside cavity Ink: New anterior margin is inked in black Size: 11.0 x 4.5 x 4.0 cm Description: Grossly unremarkable bone fibrofatty tissue fragment with minor fibrotic changes (less than 1% of entire tissue).  There is no hemorrhage necrosis nodules or  other abnormality grossly identified Entirely fibrotic changes are submitted in 6 cassettes.   F. Labeled: Right breast superior margin Received: In formalin Oriented: Short stitch is anterior, long sti                         tch is lateral, black stitch inside cavity Ink: New superior margin is inked in black Size: 6.5 x 6.0 x 1.7 Description: Grossly unremarkable bone fibrofatty tissue fragment with minor fibrotic changes (less than 1% of entire tissue).  There is no hemorrhage necrosis nodules or other abnormality grossly identified Entirely fibrotic changes are submitted in 6 cassettes.   G. Labeled: Right breast medial margin Received: In formalin Oriented: Short stitch superior, long stitch anterior, black stitch inside cavity Ink: New medial margin is inked in black Size: 7.0 x 6.0 x 2.0 cm Description: Grossly unremarkable bone fibrofatty tissue fragment with minor fibrotic changes (less than 1% of entire tissue).  There is no hemorrhage necrosis nodules or other abnormality grossly identified Entirely fibrotic changes are submitted in 6 cassettes.   Final Diagnosis performed by Quay Burow, MD.   Electronically signed 02/13/2019 2:52:47PM The electronic signature i                         ndicates that the named Attending Pathologist has evaluated the specimen Technical component performed at [facility], [address], [place], [place] [address] Lab: [phone] Dir: Rush Farmer, MD, MMM  Professional component performed at [facility], [facility], [place], [place], [place] [address] Lab: [phone] Dir: Dellia Nims. Reuel Derby, MD     Assessment:  Jill Bond is a 68 y.o. female withstage IA right breast cancer s/p lumpectomy on  02/06/2019.  Pathology [phone]) revealed a 1.2 cm grade I invasive carcinoma of no special type.  Low grade DCIS was present.  Closest margin was 4 mm.  One of three sentinel lymph nodes were positive for  micro metastatic carcinoma (> 0.2 to 2 mm). Margins are negative for atypia and malignancy.  Tumor was ER positive (> 90%) PR positive (51-90%) and HER-2/neu negative (1+).  Pathologic stage was pT1c pN16m.  Bilateral screening mammogram on 12/16/2018 revealed a mass in the right breast.  There were no suspicious findings in the left breast.  Right diagnostic mammogram and ultrasound on 01/17/2019 revealed a  1 x 1.2 x 1.1 cm hypoechoic mass in the right breast at the 9:30 position, 7 cm from the nipple.  There were indeterminate calcifications in the upper-outer quadrant of the right breast.  Lymph nodes were unremarkable.  She has sarcoidosis s/p utrasound guided supraclavicular lymph node biopsy on 03/11/2016.  Pathology revealed non-necrotizing granulomatousinflammation with fibrosis. There was no evidence of malignancy. GMS-fungal, AFB and Gram stainswerenegative.She presented withsmall upper abdominal adenopathy,bibasilar lung nodes, a30 pound weight loss,some drenching night sweats, and mild hypercalcemia.   Abdominal and pelvic CTscan on 02/26/2016 revealed multiple new bibasilar lung nodules measuring up to 5 mm in size. There was new para-aortic and gastrohepatic lymph nodes measuring up to 1 cm in size, suspicious for metastatic disease.  She has a history ofmultiple angiomyolipomass/p left nephrectomy on 03/15/2006. Baseline creatinineis 0.9 - 1.1 (CrCl 50-63 ml/min).  Colonoscopyin 2013 revealed polyps (next scheduled 2018). She had a partial hysterectomy at age 68   Symptomatically, she notes increased warmth and tenderness at her incision site since 02/15/2019 or 02/16/2019.  She has no fever.  Exam reveals early cellulitis.  Plan: 1.   Stage IA right breast cancer             Review pathology from lumpectomy.  Pathologic stage is T1cN195m(stage IA)  Discuss no plan for additional axillary surgery based on NCCN guidelines.  Discuss sending tumor for OncoType  DX testing to determine potential benefit from chemotherapy.   Patient in agreement.             Discuss plan for breast radiation followed by endocrine therapy for at least 5 years.   Discuss referral to radiation oncology.   Discuss tamoxifen vs aromatase inhibitors.    Potential side effects re-reviewed.    Follow-up planned bone density on 02/22/2019. 2.   Post-operative breast infection  Discuss concern for post-operative infection at site of incision.  Dr. CiWindell Momentas unable to be reached during clinic.  Review antibiotic choices:   Patient allergic to penicillin.    Patient notes rash and some throat tightness.   Cross reactivity of PCN with cephalosporins ranges between 10-23%.    Patient unsure if she has been on Keflex.   Patient hesitant about Septra secondary to having one kidney.  Differ antibiotics to Dr CiWindell Moment3.   Send Oncotype DX on lumpectomy specimen. 4.   Referral to radiation oncology. 5.   RTC for MD assessment (Doximitry) after OncotypeDX back re: chemotherapy.  I discussed the assessment and treatment plan with the patient.  The patient was provided an opportunity to ask questions and all were answered.  The patient agreed with the plan and demonstrated an understanding of the instructions.  The patient was advised to call back if the symptoms worsen or if the condition fails to improve as anticipated.   MeLequita AsalMD, PhD  02/20/2019, 1:45 PM  I, [employer], am acting as Education administrator for [employer]. Mike Gip, MD, PhD.  I, Charmin Aguiniga C. Mike Gip, MD, have reviewed the above documentation for accuracy and completeness, and I agree with the above.

## 2019-02-20 ENCOUNTER — Encounter: Payer: Self-pay | Admitting: Hematology and Oncology

## 2019-02-20 ENCOUNTER — Other Ambulatory Visit: Payer: Self-pay

## 2019-02-20 ENCOUNTER — Inpatient Hospital Stay: Payer: Medicare HMO | Attending: Hematology and Oncology | Admitting: Hematology and Oncology

## 2019-02-20 VITALS — BP 157/73 | HR 68 | Temp 98.1°F | Resp 18 | Wt 238.2 lb

## 2019-02-20 DIAGNOSIS — N189 Chronic kidney disease, unspecified: Secondary | ICD-10-CM | POA: Diagnosis not present

## 2019-02-20 DIAGNOSIS — Z170 Estrogen receptor positive status [ER+]: Secondary | ICD-10-CM | POA: Insufficient documentation

## 2019-02-20 DIAGNOSIS — Z79899 Other long term (current) drug therapy: Secondary | ICD-10-CM | POA: Diagnosis not present

## 2019-02-20 DIAGNOSIS — L03818 Cellulitis of other sites: Secondary | ICD-10-CM

## 2019-02-20 DIAGNOSIS — I129 Hypertensive chronic kidney disease with stage 1 through stage 4 chronic kidney disease, or unspecified chronic kidney disease: Secondary | ICD-10-CM | POA: Diagnosis not present

## 2019-02-20 DIAGNOSIS — C50411 Malignant neoplasm of upper-outer quadrant of right female breast: Secondary | ICD-10-CM | POA: Insufficient documentation

## 2019-02-20 DIAGNOSIS — Z7189 Other specified counseling: Secondary | ICD-10-CM | POA: Diagnosis not present

## 2019-02-20 NOTE — Progress Notes (Signed)
Patient here for follow up. Right Breast Pain, Heat, and Redness x 2 weeks since surgery. Denies any fever.

## 2019-02-22 ENCOUNTER — Ambulatory Visit
Admission: RE | Admit: 2019-02-22 | Discharge: 2019-02-22 | Disposition: A | Discharge status: Home / Self Care | Payer: Medicare HMO | Source: Ambulatory Visit | Attending: Hematology and Oncology | Admitting: Hematology and Oncology

## 2019-02-22 DIAGNOSIS — C50411 Malignant neoplasm of upper-outer quadrant of right female breast: Secondary | ICD-10-CM | POA: Diagnosis not present

## 2019-02-22 DIAGNOSIS — E2839 Other primary ovarian failure: Secondary | ICD-10-CM | POA: Diagnosis not present

## 2019-02-22 DIAGNOSIS — Z170 Estrogen receptor positive status [ER+]: Secondary | ICD-10-CM | POA: Insufficient documentation

## 2019-02-22 DIAGNOSIS — Z13820 Encounter for screening for osteoporosis: Secondary | ICD-10-CM | POA: Insufficient documentation

## 2019-02-22 DIAGNOSIS — Z780 Asymptomatic menopausal state: Secondary | ICD-10-CM | POA: Insufficient documentation

## 2019-02-27 ENCOUNTER — Ambulatory Visit: Payer: Medicare HMO | Admitting: Hematology and Oncology

## 2019-02-28 ENCOUNTER — Other Ambulatory Visit: Payer: Self-pay

## 2019-03-01 ENCOUNTER — Other Ambulatory Visit: Payer: Self-pay

## 2019-03-01 ENCOUNTER — Ambulatory Visit
Admission: RE | Admit: 2019-03-01 | Discharge: 2019-03-01 | Disposition: A | Discharge status: Home / Self Care | Payer: Medicare HMO | Source: Ambulatory Visit | Attending: Radiation Oncology | Admitting: Radiation Oncology

## 2019-03-01 ENCOUNTER — Encounter: Payer: Self-pay | Admitting: Radiation Oncology

## 2019-03-01 VITALS — BP 142/79 | HR 74 | Temp 97.2°F | Resp 18 | Wt 235.2 lb

## 2019-03-01 DIAGNOSIS — C44519 Basal cell carcinoma of skin of other part of trunk: Secondary | ICD-10-CM | POA: Diagnosis not present

## 2019-03-01 DIAGNOSIS — M129 Arthropathy, unspecified: Secondary | ICD-10-CM | POA: Diagnosis not present

## 2019-03-01 DIAGNOSIS — Z803 Family history of malignant neoplasm of breast: Secondary | ICD-10-CM | POA: Diagnosis not present

## 2019-03-01 DIAGNOSIS — F418 Other specified anxiety disorders: Secondary | ICD-10-CM | POA: Insufficient documentation

## 2019-03-01 DIAGNOSIS — I10 Essential (primary) hypertension: Secondary | ICD-10-CM | POA: Diagnosis not present

## 2019-03-01 DIAGNOSIS — D869 Sarcoidosis, unspecified: Secondary | ICD-10-CM | POA: Diagnosis not present

## 2019-03-01 DIAGNOSIS — Z170 Estrogen receptor positive status [ER+]: Secondary | ICD-10-CM | POA: Diagnosis not present

## 2019-03-01 DIAGNOSIS — C50411 Malignant neoplasm of upper-outer quadrant of right female breast: Secondary | ICD-10-CM | POA: Diagnosis not present

## 2019-03-01 DIAGNOSIS — G4730 Sleep apnea, unspecified: Secondary | ICD-10-CM | POA: Diagnosis not present

## 2019-03-01 DIAGNOSIS — Z8042 Family history of malignant neoplasm of prostate: Secondary | ICD-10-CM | POA: Insufficient documentation

## 2019-03-01 DIAGNOSIS — Z79899 Other long term (current) drug therapy: Secondary | ICD-10-CM | POA: Diagnosis not present

## 2019-03-01 DIAGNOSIS — D492 Neoplasm of unspecified behavior of bone, soft tissue, and skin: Secondary | ICD-10-CM | POA: Diagnosis not present

## 2019-03-01 NOTE — Consult Note (Signed)
NEW PATIENT EVALUATION  Name: Jill Bond  MRN: [id number]  Date:   03/01/2019     DOB: 11/08/1950   This 68 y.o. female patient presents to the clinic for initial evaluation of pathologic stage T1c N1 MI M0) invasive mammary carcinoma status post wide local excision and sentinel node biopsy.  ER/PR positive HER-2 negative with Oncotype DX pending  REFERRING PHYSICIAN: Baxter Hire, MD  CHIEF COMPLAINT:  Chief Complaint  Patient presents with  . Breast Cancer    Initial consultation    DIAGNOSIS: The encounter diagnosis was Malignant neoplasm of upper-outer quadrant of right breast in female, estrogen receptor positive ([place]).   PREVIOUS INVESTIGATIONS:  Mammogram and ultrasound reviewed Pathology report reviewed Clinical notes reviewed  HPI: Patient is a 68 year old female who presented with an abnormal mammogram of her right breast show a spiculated mass in the upper outer posterior right breast measuring approximately 1 cm.  This was confirmed on ultrasound.  She underwent ultrasound-guided biopsy which was positive for ER/PR positive HER-2/neu negative invasive mammary carcinoma.  She went on to have a wide local excision showing a 1.2 cm invasive mammary carcinoma overall grade 1.  There is low-grade ductal carcinoma present also.  Margins were all clear at 4 mm.  There was 1 lymph node with micrometastatic disease as well as 1 lymph node with isolated tumor cells present.  She has done well postoperatively.  She specifically denies breast tenderness cough or bone pain.  Her tumor has been sent off for Oncotype DX testing and that is pending.  She is seen today for radiation oncology consultation.  PLANNED TREATMENT REGIMEN: Right whole breast and peripheral emphatic radiation  PAST MEDICAL HISTORY:  has a past medical history of Anxiety, Arthritis, Breast cancer ([place]) (01/2019), Chronic kidney disease (9528), Complication of anesthesia, Constipation, Depression, GERD  (gastroesophageal reflux disease), Headache, Hypertension, PONV (postoperative nausea and vomiting), Rotator cuff disorder, Sarcoidosis, Shortness of breath dyspnea, and Sleep apnea.    PAST SURGICAL HISTORY:  Past Surgical History:  Procedure Laterality Date  . ABDOMINAL HYSTERECTOMY     PARTIAL  . ACHILLES TENDON SURGERY Left 02/22/2015   Procedure: Secondary ACHILLES TENDON REPAIR;  Surgeon: Albertine Patricia, DPM;  Location: [facility];  Service: Podiatry;  Laterality: Left;  . Bladder tac    . BREAST BIOPSY Right 01/23/2019   affirm stereo bx of calcs with x marker, ductal hyperplasia  . BREAST BIOPSY Right 01/23/2019   [place] bx of mass with coil marker, invasive mammary carcinoma  . BREAST EXCISIONAL BIOPSY Left 2003   neg  . BREAST LUMPECTOMY Right 02/06/2019  . COLONOSCOPY  2013  . JOINT REPLACEMENT Left 2007   Total Knee Replacement  . KIDNEY CYST REMOVAL Left   . OSTECTOMY Left 02/22/2015   Procedure: OSTECTOMY;  Surgeon: Albertine Patricia, DPM;  Location: [facility];  Service: Podiatry;  Laterality: Left;  . PARTIAL MASTECTOMY WITH NEEDLE LOCALIZATION AND AXILLARY SENTINEL LYMPH NODE BX Right 02/06/2019   Procedure: PARTIAL MASTECTOMY WITH NEEDLE LOCALIZATION AND AXILLARY SENTINEL LYMPH NODE BX;  Surgeon: Herbert Pun, MD;  Location: [facility];  Service: General;  Laterality: Right;  . SHOULDER ARTHROSCOPY W/ ROTATOR CUFF REPAIR Left YRS AGO  . TONSILLECTOMY  AGE 46  . TOTAL KNEE ARTHROPLASTY Right 10/22/2015   Procedure: RIGHT TOTAL KNEE ARTHROPLASTY;  Surgeon: Paralee Cancel, MD;  Location: [facility];  Service: Orthopedics;  Laterality: Right;  . TOTAL KNEE ARTHROPLASTY Right    10/22/2015    FAMILY HISTORY: family  history includes Breast cancer (age of onset: 82) in her mother; Heart failure in her father; Prostate cancer in her father.  SOCIAL HISTORY:  reports that she has never smoked. She has never used smokeless tobacco. She reports that she does not drink alcohol or use  drugs.  ALLERGIES: Codeine and Penicillins  MEDICATIONS:  Current Outpatient Medications  Medication Sig Dispense Refill  . amLODipine (NORVASC) 10 MG tablet Take 10 mg by mouth at bedtime.    . diclofenac sodium (VOLTAREN) 1 % GEL Apply 1 application topically 2 (two) times daily as needed (foot pain).    Marland Kitchen docusate sodium (COLACE) 100 MG capsule Take 200 mg by mouth at bedtime.    . methotrexate (RHEUMATREX) 2.5 MG tablet TAKE 4 TABLETS (10 MG TOTAL) BY MOUTH ONCE A WEEK. CAUTION:CHEMOTHERAPY. PROTECT FROM LIGHT. 52 tablet 2  . metoprolol succinate (TOPROL-XL) 100 MG 24 hr tablet Take 100 mg by mouth at bedtime.     . Multiple Vitamin (MULTIVITAMIN WITH MINERALS) TABS tablet Take 0.5 tablets by mouth daily.     . OMEGA-3 FATTY ACIDS PO Take 1,000 mg by mouth daily.     Marland Kitchen omeprazole (PRILOSEC) 20 MG capsule Take 20 mg by mouth daily.     Marland Kitchen oxycodone (OXY-IR) 5 MG capsule TAKE 1 CAPSULE (5 MG TOTAL) BY MOUTH EVERY 4 (FOUR) HOURS AS NEEDED FOR PAIN FOR UP TO 5 DAYS    . vitamin E 400 UNIT capsule Take 400 mg by mouth daily.    Marland Kitchen gabapentin (NEURONTIN) 300 MG capsule Take 1 capsule (300 mg total) by mouth 3 (three) times daily for 10 days. (Patient not taking: Reported on 03/01/2019) 30 capsule 0   No current facility-administered medications for this encounter.     ECOG PERFORMANCE STATUS:  0 - Asymptomatic  REVIEW OF SYSTEMS: Patient denies any weight loss, fatigue, weakness, fever, chills or night sweats. Patient denies any loss of vision, blurred vision. Patient denies any ringing  of the ears or hearing loss. No irregular heartbeat. Patient denies heart murmur or history of fainting. Patient denies any chest pain or pain radiating to her upper extremities. Patient denies any shortness of breath, difficulty breathing at night, cough or hemoptysis. Patient denies any swelling in the lower legs. Patient denies any nausea vomiting, vomiting of blood, or coffee ground material in the vomitus.  Patient denies any stomach pain. Patient states has had normal bowel movements no significant constipation or diarrhea. Patient denies any dysuria, hematuria or significant nocturia. Patient denies any problems walking, swelling in the joints or loss of balance. Patient denies any skin changes, loss of hair or loss of weight. Patient denies any excessive worrying or anxiety or significant depression. Patient denies any problems with insomnia. Patient denies excessive thirst, polyuria, polydipsia. Patient denies any swollen glands, patient denies easy bruising or easy bleeding. Patient denies any recent infections, allergies or URI. Patient "s visual fields have not changed significantly in recent time.   PHYSICAL EXAM: BP (!) 142/79 (BP Location: Left Arm, Patient Position: Sitting)   Pulse 74   Temp (!) 97.2 F (36.2 C) (Tympanic)   Resp 18   Wt 235 lb 3.2 oz (106.7 kg)   BMI 40.37 kg/m  Right breast status post wide local excision in the axillary tail.  Incision is healed well.  Breasts are large and pendulous.  No dominant mass or nodularity is noted in either breast in 2 positions examined.  No axillary or supraclavicular adenopathy is identified.  Well-developed well-nourished patient in NAD. HEENT reveals PERLA, EOMI, discs not visualized.  Oral cavity is clear. No oral mucosal lesions are identified. Neck is clear without evidence of cervical or supraclavicular adenopathy. Lungs are clear to A&P. Cardiac examination is essentially unremarkable with regular rate and rhythm without murmur rub or thrill. Abdomen is benign with no organomegaly or masses noted. Motor sensory and DTR levels are equal and symmetric in the upper and lower extremities. Cranial nerves II through XII are grossly intact. Proprioception is intact. No peripheral adenopathy or edema is identified. No motor or sensory levels are noted. Crude visual fields are within normal range.  LABORATORY DATA: Pathology report reviewed     RADIOLOGY RESULTS: Mammogram and ultrasound reviewed   IMPRESSION: Stage T1c N1 MI M0 invasive mammary carcinoma of the right breast status post wide local excision and sentinel node biopsy in 68 year old female.  Tumor is ER/PR positive HER-2/neu negative.  Oncotype DX is pending.  PLAN: At this time I have recommended whole breast radiation as well as the peripheral emphatic radiation.  We will plan on delivering 5040 cGy 28 fractions to both areas.  Would also boost her scar another 1400 cGy using electron-beam.  Risks and benefits of treatment including skin reaction fatigue alteration of blood counts slight chance of lymphedema of her right upper extremity and possible inclusion of superficial lung all were discussed in detail with the patient.  Of also discussed personally her case with her daughter.  We will await her Oncotype DX before proceeding to simulation.  Patient comprehends my treatment plan well  I would like to take this opportunity to thank you for allowing me to participate in the care of your patient.Noreene Filbert, MD

## 2019-03-08 ENCOUNTER — Other Ambulatory Visit: Payer: Self-pay

## 2019-03-09 ENCOUNTER — Telehealth: Payer: Self-pay | Admitting: *Deleted

## 2019-03-09 ENCOUNTER — Other Ambulatory Visit: Payer: Self-pay

## 2019-03-09 ENCOUNTER — Ambulatory Visit
Admission: RE | Admit: 2019-03-09 | Discharge: 2019-03-09 | Disposition: A | Discharge status: Home / Self Care | Payer: Medicare HMO | Source: Ambulatory Visit | Attending: Radiation Oncology | Admitting: Radiation Oncology

## 2019-03-09 ENCOUNTER — Encounter: Payer: Self-pay | Admitting: Hematology and Oncology

## 2019-03-09 DIAGNOSIS — C50411 Malignant neoplasm of upper-outer quadrant of right female breast: Secondary | ICD-10-CM | POA: Insufficient documentation

## 2019-03-09 DIAGNOSIS — C773 Secondary and unspecified malignant neoplasm of axilla and upper limb lymph nodes: Secondary | ICD-10-CM | POA: Diagnosis not present

## 2019-03-09 DIAGNOSIS — Z170 Estrogen receptor positive status [ER+]: Secondary | ICD-10-CM | POA: Diagnosis not present

## 2019-03-09 NOTE — Telephone Encounter (Signed)
Reached out to Oncotype. No results available. Patient's insurance is still in the approval process. Dr. Olena Leatherwood team and Dr. Mike Gip informed.

## 2019-03-10 ENCOUNTER — Encounter: Payer: Self-pay | Admitting: Hematology and Oncology

## 2019-03-10 DIAGNOSIS — Z170 Estrogen receptor positive status [ER+]: Secondary | ICD-10-CM | POA: Diagnosis not present

## 2019-03-10 DIAGNOSIS — C50411 Malignant neoplasm of upper-outer quadrant of right female breast: Secondary | ICD-10-CM | POA: Diagnosis not present

## 2019-03-10 DIAGNOSIS — C773 Secondary and unspecified malignant neoplasm of axilla and upper limb lymph nodes: Secondary | ICD-10-CM | POA: Diagnosis not present

## 2019-03-14 NOTE — Progress Notes (Signed)
[facility]  [address] [place], [place] [address] Phone: [phone]  Fax: [phone]   Telemedicine Office Visit:  03/16/2019  Referring physician: Baxter Hire, MD  I connected with Jill Bond on 03/16/2019 at 11:13 AM by videoconferencing and verified that I was speaking with the correct person using 2 identifiers.  The patient was at her daughter's hair salon.  I discussed the limitations, risk, security and privacy concerns of performing an evaluation and management service by videoconferencing and the availability of in person appointments.  I also discussed with the patient that there may be a patient responsible charge related to this service.  The patient expressed understanding and agreed to proceed.   Chief Complaint: Jill Bond is a 68 y.o. female with sarcoid andstage IA right breastcancer who is seen for review of OncoType DX testing and discussion regarding direction of therapy.   HPI: The patient was last seen in the medical oncology clinic on 02/20/2019. At that time, she noted increased warmth and tenderness at her incision site since 02/15/2019 or 02/16/2019. She had no fever. Exam revealed early cellulitis. We discussed sending off tumor for OncoType DX testing to determine potential benefit from chemotherapy. Patient was in agreement. I discussed plan for breast radiation followed by endocrine therapy for at least 5 years. I discussed tamoxifen versus aromatase inhibitors. Referral was made to radiation oncology.   Patient was seen by Dr. Windell Moment on 02/21/2019. Patient complained of soreness and redness around the lateral right breast wound area. She denied any drainage. She denied any fevers or chills. Bactrim DS 1 tablet BID x 7 days was prescribed. She had a follow up in 1 week.   She followed up with Dr. Windell Moment on 02/28/2019. Her wound had improved. She was feeling better. She denied any drainage, pain,  fevers, and chills.   Bone density on 02/22/2019 was normal with a T-score of -0.5 at the right femur neck.   She had an initial consultation with Dr. Baruch Gouty on 03/01/2019. Dr. Baruch Gouty recommended a whole breast radiation as well as the peripheral lymphatic radiation. He planned on delivering 5040 cGy 28 fractions to both areas. He wanted to boost her scar another 1400 cGy using electron-beam.   She was seen on 03/07/2019 by Dr. Windell Moment for follow-up of superficial infection of the mastectomy site. She responded to oral antibiotic therapy well. She denied any pain. She denied any drainage. She had no edema of the wound. She had some risk for pain on the axillary area. There was no need for more antibiotic therapy at that time. There was no contraindication to proceed with radiation therapy. She will follow up in 2 months.   Oncotype DX testing on 03/09/2019 revealed a recurrence score of 16 with a distant recurrence risk at 9 years of 15% (95% CI 10-19%) and no apparent benefit of chemotherapy.    She underwent radiation simulation on 03/09/2019.  Breast radiation will start on 03/20/2019; she is scheduled to finish radiation on 05/11/2019.   During the interim, she has felt "a lot better".  She notes being on a CPAP machine at night. She has a stable weight. She has rhinorrhea (normal). She has floaters and she will see an eye doctor. She had chest pain earlier today. She has leg and toe cramps.  She had agressive basal cell carcinoma that is now resolved.   Past Medical History:  Diagnosis Date  . Anxiety   .  Arthritis   . Breast cancer ([place]) 01/2019   invasive mammary carcinoma   . Chronic kidney disease 2006   Left Nephrectomy  . Complication of anesthesia    PONV  . Constipation   . Depression   . GERD (gastroesophageal reflux disease)   . Headache   . Hypertension   . PONV (postoperative nausea and vomiting)   . Rotator cuff disorder    LEFT  . Sarcoidosis   .  Shortness of breath dyspnea    with exertion  . Sleep apnea     Past Surgical History:  Procedure Laterality Date  . ABDOMINAL HYSTERECTOMY     PARTIAL  . ACHILLES TENDON SURGERY Left 02/22/2015   Procedure: Secondary ACHILLES TENDON REPAIR;  Surgeon: Albertine Patricia, DPM;  Location: [facility] ORS;  Service: Podiatry;  Laterality: Left;  . Bladder tac    . BREAST BIOPSY Right 01/23/2019   affirm stereo bx of calcs with x marker, ductal hyperplasia  . BREAST BIOPSY Right 01/23/2019   [place] bx of mass with coil marker, invasive mammary carcinoma  . BREAST EXCISIONAL BIOPSY Left 2003   neg  . BREAST LUMPECTOMY Right 02/06/2019  . COLONOSCOPY  2013  . JOINT REPLACEMENT Left 2007   Total Knee Replacement  . KIDNEY CYST REMOVAL Left   . OSTECTOMY Left 02/22/2015   Procedure: OSTECTOMY;  Surgeon: Albertine Patricia, DPM;  Location: [facility] ORS;  Service: Podiatry;  Laterality: Left;  . PARTIAL MASTECTOMY WITH NEEDLE LOCALIZATION AND AXILLARY SENTINEL LYMPH NODE BX Right 02/06/2019   Procedure: PARTIAL MASTECTOMY WITH NEEDLE LOCALIZATION AND AXILLARY SENTINEL LYMPH NODE BX;  Surgeon: Herbert Pun, MD;  Location: [facility] ORS;  Service: General;  Laterality: Right;  . SHOULDER ARTHROSCOPY W/ ROTATOR CUFF REPAIR Left YRS AGO  . TONSILLECTOMY  AGE 60  . TOTAL KNEE ARTHROPLASTY Right 10/22/2015   Procedure: RIGHT TOTAL KNEE ARTHROPLASTY;  Surgeon: Paralee Cancel, MD;  Location: [facility] ORS;  Service: Orthopedics;  Laterality: Right;  . TOTAL KNEE ARTHROPLASTY Right    10/22/2015    Family History  Problem Relation Age of Onset  . Breast cancer Mother 33  . Heart failure Father   . Prostate cancer Father     Social History:  reports that she has never smoked. She has never used smokeless tobacco. She reports that she does not drink alcohol or use drugs. She denies any exposure to radiation or toxins. 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 Her husband died on Sep 14, 2013.Her first  cousinis [name] [phone]).Her daughter, Pat,is her medical power of attorney. The patient is accompanied by her daughter, [name], today.  Participants in the patient's visit and their role in the encounter included the patient, her daughter, and Vito Berger, CMA, today. The intake visit was provided by Vito Berger, CMA.  Allergies:  Allergies  Allergen Reactions  . Codeine Nausea And Vomiting  . Penicillins Hives and Other (See Comments)    Has patient had a PCN reaction causing immediate rash, facial/tongue/throat swelling, SOB or lightheadedness with hypotension: no Has patient had a PCN reaction causing severe rash involving mucus membranes or skin necrosis: no Has patient had a PCN reaction that required hospitalization no Has patient had a PCN reaction occurring within the last 10 years: no If all of the above answers are "NO", then may proceed with Cephalosporin use.     Current Medications: Current Outpatient Medications  Medication Sig Dispense Refill  . amLODipine (NORVASC) 10 MG tablet Take 10 mg  by mouth at bedtime.    . docusate sodium (COLACE) 100 MG capsule Take 200 mg by mouth at bedtime.    . methotrexate (RHEUMATREX) 2.5 MG tablet TAKE 4 TABLETS (10 MG TOTAL) BY MOUTH ONCE A WEEK. CAUTION:CHEMOTHERAPY. PROTECT FROM LIGHT. 52 tablet 2  . metoprolol succinate (TOPROL-XL) 100 MG 24 hr tablet Take 100 mg by mouth at bedtime.     . Multiple Vitamin (MULTIVITAMIN WITH MINERALS) TABS tablet Take 0.5 tablets by mouth daily.     . OMEGA-3 FATTY ACIDS PO Take 1,000 mg by mouth daily.     Marland Kitchen omeprazole (PRILOSEC) 20 MG capsule Take 20 mg by mouth daily.     . vitamin E 400 UNIT capsule Take 400 mg by mouth daily.    . diclofenac sodium (VOLTAREN) 1 % GEL Apply 1 application topically 2 (two) times daily as needed (foot pain).    Marland Kitchen gabapentin (NEURONTIN) 300 MG capsule Take 1 capsule (300 mg total) by mouth 3 (three) times daily for 10 days. (Patient not taking:  Reported on 03/01/2019) 30 capsule 0  . oxycodone (OXY-IR) 5 MG capsule TAKE 1 CAPSULE (5 MG TOTAL) BY MOUTH EVERY 4 (FOUR) HOURS AS NEEDED FOR PAIN FOR UP TO 5 DAYS     No current facility-administered medications for this visit.     Review of Systems  Constitutional: Negative for chills, diaphoresis, fever, malaise/fatigue and weight loss (no weight today).       Feels "a lot better".  HENT: Negative for congestion, ear pain, hearing loss, nosebleeds, sinus pain and sore throat.        Chronic rhinorrhea.  Eyes: Negative for blurred vision and double vision.       Floaters.  Respiratory: Negative for cough, hemoptysis, sputum production, shortness of breath and wheezing.        Sleep apnea on CPAP at night.  Cardiovascular: Positive for chest pain. Negative for palpitations, orthopnea, claudication and leg swelling.  Gastrointestinal: Negative.  Negative for abdominal pain, blood in stool, constipation, diarrhea, melena, nausea and vomiting.  Genitourinary: Negative.  Negative for dysuria, frequency, hematuria and urgency.  Musculoskeletal: Negative for back pain (chronic x 2 years), falls, joint pain, myalgias and neck pain.       Leg and feet cramps.  Skin: Negative for itching and rash.       Right breast skin infection, improved.  Neurological: Negative.  Negative for dizziness, tingling, sensory change, speech change, focal weakness, weakness and headaches (resolved).  Endo/Heme/Allergies: Negative.  Does not bruise/bleed easily.  Psychiatric/Behavioral: Positive for memory loss. Negative for depression. The patient is not nervous/anxious and does not have insomnia.   All other systems reviewed and are negative.   Performance status (ECOG): 1  Physical Exam  Constitutional: She is oriented to person, place, and time. She appears well-developed and well-nourished. No distress.  HENT:  Head: Normocephalic and atraumatic.  Shoulder length brown graying hair.  Mask.  Eyes:  Conjunctivae and EOM are normal. No scleral icterus.  Glasses.  Abdominal: There is no hepatosplenomegaly.  Neurological: She is alert and oriented to person, place, and time.  Skin: She is not diaphoretic.  Psychiatric: She has a normal mood and affect. Her behavior is normal. Judgment and thought content normal.  Nursing note reviewed.   No visits with results within 3 Day(s) from this visit.  Latest known visit with results is:  Admission on 02/06/2019, Discharged on 02/06/2019  Component Date Value Ref Range Status  . SURGICAL PATHOLOGY  02/06/2019    Final-Edited                   Value:SURGICAL PATHOLOGY CASE: [id number] PATIENT: [facility] Surgical Pathology Report  Specimen Submitted: A. Breast mass, right B. Sentinel lymph node 1, right axillary C. Sentinel lymph node 2, right axillary D. Sentinel lymph node 3, right axillary E. Breast, right, ant margin, short stitch sup F. Breast, right, sup margin, short stitch ant G. Breast, right, med margin, short stitch sup  Clinical History: C50.411 - malignant neoplasm of upper-outer quadrant of RT female breast, unspecified estrogen receptor  DIAGNOSIS: A.  BREAST MASS, RIGHT; NEEDLE LOCALIZED PARTIAL MASTECTOMY: - INVASIVE MAMMARY CARCINOMA. - DUCTAL CARCINOMA IN SITU. - SEE CANCER SUMMARY BELOW. - BIOPSY SITE CHANGE WITH METALLIC CLIP.  B.  SENTINEL LYMPH NODE 1, RIGHT AXILLARY; EXCISION: - ONE LYMPH NODE WITH 1.2 MM FOCUS OF MICRO METASTATIC CARCINOMA. - DEEPER SECTIONS AND IHC SLIDES EXAMINED.  C.  SENTINEL LYMPH NODE 2, RIGHT AXILLARY; EXCISION: - ONE LYMPH NODE NEGA                         TIVE FOR MALIGNANCY (0/1). - DEEPER SECTIONS EXAMINED.  D.  SENTINEL LYMPH NODE 3, RIGHT AXILLARY; EXCISION: - ONE LYMPH NODE NEGATIVE FOR MALIGNANCY (0/1).  E.  BREAST ANTERIOR MARGIN, RIGHT; EXCISION: - BENIGN BREAST TISSUE WITH USUAL DUCTAL HYPERPLASIA AND AREA OF PRIOR BIOPSY SITE CHANGE. - NEGATIVE FOR ATYPIA  AND MALIGNANCY.  F.  BREAST SUPERIOR MARGIN, RIGHT; EXCISION: - BENIGN BREAST TISSUE WITH AREA OF PRIOR BIOPSY SITE CHANGE. - NEGATIVE FOR ATYPIA AND MALIGNANCY.  G.  BREAST MEDIAL MARGIN, RIGHT; EXCISION: - BENIGN BREAST TISSUE WITH USUAL DUCTAL HYPERPLASIA AND AREA OF PRIOR BIOPSY SITE CHANGE. - NEGATIVE FOR ATYPIA AND MALIGNANCY.  CANCER CASE SUMMARY: INVASIVE CARCINOMA OF THE BREAST Procedure: Needle localized partial mastectomy Specimen Laterality: Right Tumor Size: 12 mm Histologic Type: Invasive carcinoma of no special type (ductal) Histologic Grade (Nottingham Histologic Score)      Glandular (Acinar)/Tubular Differentiation: 1                                       Nuclear Pleomorphism: 2              Mitotic Rate: 1              Overall Grade: 1 Ductal Carcinoma In Situ (DCIS): Present, low-grade      Negative for extensive intraductal component (EIC) Margins:              Invasive Carcinoma Margins: Uninvolved by invasive carcinoma                      Distance from closest margin: 4 mm                      Specify closest margin: Posterior              DCIS Margins: Uninvolved by DCIS                      Distance from closest margin: 8 mm                      Specify closest margin: Posterior Regional Lymph Nodes: Involved by tumor cells  Number of Lymph Nodes with Macrometastases (>2 mm): 0              Number of Lymph Nodes with Micrometastases (>0.2 mm to 2 mm): 1              Number of Lymph Nodes with Isolated Tumor Cells (=0.2 mm or =200 cells): 0              Size of Largest Metastatic Deposit: 1.2 mm              Extranodal Extension: Not identified              Total Number of Lymph Nodes Examined: 3                                       Number of Sentinel Nodes Examined: 3 Treatment Effect in the Breast: No known presurgical therapy Lymphovascular Invasion: Present Pathologic Stage Classification (pTNM, AJCC 8th Edition): pT1c pN60m (sn)  Breast Biomarker Testing Performed on Previous Biopsy: [id number]     Estrogen Receptor (ER) Status: Positive      Progesterone Receptor (PgR) Status: Positive      HER2 (by immunohistochemistry): Negative  Comment: Prior biopsy site change is present in the inferior and lateral aspects of the re-excised superior and medial margins respectively. A limited panel of immunohistochemical stains was performed on part B to characterize the origin of metastatic carcinoma. The micro metastatic carcinoma is positive for GATA-3 and is negative for PAX-8 and TTF-1. This pattern of staining supports the above diagnosis.  IHC slides [facility], [place]. All controls stained appropriately.  This test was d                         eveloped and its performance characteristics determined by [facility]. It has not been cleared or approved by the [place] and Drug Administration. The FDA does not require this test to go through premarket FDA review. This test is used for clinical purposes. It should not be regarded as investigational or for research. This laboratory is certified under the Clinical Laboratory Improvement Amendments (CLIA) as qualified to perform high complexity clinical laboratory testing.  GROSS DESCRIPTION: Intraoperative Consultation:     Labeled: Right breast mass     Received: Fresh at 12:57 PM     Specimen: Partial mastectomy with needle localized     Pathologic evaluation performed: Gross margin evaluation     Diagnosis: Mass with clip.  Involves superior margin in medial aspect of the specimen     Communicated to: Dr. CWindell Momentby Dr. TQuay Burowat 10 7 PM on 02/06/2019     Tissue submitted: Gross evaluation only A. Labeled: Right breast mass Received: Fresh Accompanying                          specimen radiograph: Yes Radiographic findings: Needle localized metallic J-wire and metallic biopsy clip at L8 Time  in fixative: Tissue procedure time  Cold ischemic time: 26 minutes Total fixation time: 9 hours Type of procedure: Needle localized partial mastectomy Location / laterality of specimen: Right breast upper outer quadrant, 9:30 o'clock position and 7 cm from nipple Orientation of specimen: 3 metallic tags: Cranial, lateral, deep Inking: Superior = blue Inferior = green Medial =  yellow Lateral = orange Posterior = black Anterior/Superficial = red Size of specimen: 6.5 cm from anterior to posterior, 5.0 cm from superior to inferior, 6.5 cm from medial to lateral Skin: Not present Biopsy site: Coiling shape metallic biopsy clip identified in the center of the tumor Number of discrete masses: one Size of mass(es): 1.0 x 0.8 x 0.7 cm Description of mass(es): At the medial aspect of the specimen there is a speculated firm tumor surrounded by fibrotic ti                         ssue which is adjacent to the superior margin. Margins: Lateral margin is 3.5 cm away from the tumor, medial margin is 0.9 cm away from tumor, possibly adjacent to the superior margin, 3.0 cm away from inferior surgical margin, 1.2 cm away from anterior surgical margin, 0.9 away cm from posterior surgical margin Description of remainder of tissue: Grossly unremarkable fibrofatty tissue with minor fibrotic changes (less than 3% of entire tissue) Note: The tumor and adjacent to the tumor tissue is entirely submitted  Block summary: 1-lateral surgical margin 2-3-medial surgical margin 4-8-superior surgical margin 9-10-inferior surgical margin 11-13-posterior surgical margin 14-16-anterior surgical margin  B. Labeled: Right axillary sentinel lymph node #1 Received: In formalin Tissue fragment(s): 1 Size: 1.1 x 0.8 x 0.7 cm Description: Single fatty soft lymph node Entirely submitted in 1 cassette bisected lymph node.  C.  Labeled: Right axillary sentinel lymph node                          #2 Received: In  formalin Tissue fragment(s): 1 Size: 1.0 x 0.8 x 0.7 cm Description: Single fatty soft lymph node Entirely submitted in 1 cassette bisected lymph node.  D. Labeled: Right axillary sentinel lymph node #3 Received: In formalin Tissue fragment(s): 1 Size: 0.7 x 0.7 x 0.4 cm Description: Single fatty soft lymph node Entirely submitted in 1 cassette bisected lymph node.  E. Labeled: Right breast anterior margin Received: In formalin Oriented: Short stitch superior, long stitch lateral, black stitch inside cavity Ink: New anterior margin is inked in black Size: 11.0 x 4.5 x 4.0 cm Description: Grossly unremarkable bone fibrofatty tissue fragment with minor fibrotic changes (less than 1% of entire tissue).  There is no hemorrhage necrosis nodules or other abnormality grossly identified Entirely fibrotic changes are submitted in 6 cassettes.  F. Labeled: Right breast superior margin Received: In formalin Oriented: Short stitch is anterior, long sti                         tch is lateral, black stitch inside cavity Ink: New superior margin is inked in black Size: 6.5 x 6.0 x 1.7 Description: Grossly unremarkable bone fibrofatty tissue fragment with minor fibrotic changes (less than 1% of entire tissue).  There is no hemorrhage necrosis nodules or other abnormality grossly identified Entirely fibrotic changes are submitted in 6 cassettes.   G. Labeled: Right breast medial margin Received: In formalin Oriented: Short stitch superior, long stitch anterior, black stitch inside cavity Ink: New medial margin is inked in black Size: 7.0 x 6.0 x 2.0 cm Description: Grossly unremarkable bone fibrofatty tissue fragment with minor fibrotic changes (less than 1% of entire tissue).  There is no hemorrhage necrosis nodules or other abnormality grossly identified Entirely fibrotic changes are submitted in 6 cassettes.  Final Diagnosis performed by Quay Burow, MD.  Electronically signed  02/13/2019 2:52:47PM The electronic signature i                         ndicates that the named Attending Pathologist has evaluated the specimen Technical component performed at [facility], [address], [place], [place] [address] Lab: [phone] Dir: Rush Farmer, MD, MMM  Professional component performed at [facility], [facility], [place], [place], [place] [address] Lab: [phone] Dir: Dellia Nims. Reuel Derby, MD    Assessment:  Jill Bond is a 68 y.o. female withstage IA right breast cancers/p lumpectomy on 02/06/2019.  Pathology [phone]) revealed a 1.2 cm grade I invasive carcinoma of no special type.  Low grade DCIS was present.  Closest margin was 4 mm.  One of three sentinel lymph nodes were positive for micro metastatic carcinoma (> 0.2 to 2 mm). Margins are negative for atypia and malignancy.  Tumor was ER positive (>90%) PR positive (51-90%) and HER-2/neu negative (1+).Pathologic stage was pT1c pN61m.  Oncotype DX testing on 03/09/2019 revealed a recurrence score of 16 with a distant recurrence risk at 9 years of 15% (95% CI 10-19%) and no apparent benefit of chemotherapy.    Bilateralscreening mammogramon 12/16/2018 revealed a mass in the right breast.There were no suspicious findings in the left breast.Right diagnosticmammogramand ultrasoundon 01/17/2019 revealed a1 x 1.2 x 1.1 cm hypoechoicmass in the right breast at the 9:30 position, 7 cm from the nipple. There were indeterminate calcifications in the upper-outerquadrant of theright breast. Lymph nodes were unremarkable.  She hassarcoidosiss/p utrasound guided supraclavicular lymph node biopsy on 03/11/2016. Pathologyrevealed non-necrotizing granulomatousinflammation with fibrosis. There was no evidence of malignancy. GMS-fungal, AFB and Gram stainswerenegative.She presented withsmall upper abdominal adenopathy,bibasilar lung nodes, a30 pound weight loss,some  drenching night sweats, and mild hypercalcemia.   Abdominal and pelvic CTscan on 02/26/2016 revealed multiple new bibasilar lung nodules measuring up to 5 mm in size. There was new para-aortic and gastrohepatic lymph nodes measuring up to 1 cm in size, suspicious for metastatic disease.  She has a history ofmultiple angiomyolipomass/p left nephrectomy on 03/15/2006. Baseline creatinineis 0.9 - 1.1 (CrCl 50-63 ml/min).  Bone density on 02/22/2019 was normal with a T-score of -0.5 at the right femur neck.   Colonoscopyin 2013 revealed polyps (next scheduled 2018). She had a partial hysterectomy at age 68  Symptomatically, she is feeling better.  Right breast infection has cleared.  Plan: 1. Stage IA right breast cancer She is s/p lumpectomy and SLN biopsy.             Pathologic stage is T1cN174m(stage IA)             OncoType DX testing revealed a recurrence score of 16.   Risk of recurrence at 9 years with endocrine therapy alone is 15%.   No apparent benefit of chemotherapy.   Copy of report to patient  Discuss plan for radiation.   Patient s/p simulation.   Radiation to begin on 03/20/2019.  Discuss plan endocrine therapy post radiation.                         Anticipate aromatase inhibitor (Femara) as patient has a normal bone density. 2.   Post-operative breast infection             Patient s/p course of Septra. 3.   RTC 05/22/2019 for MD assessment, labs (CBC with diff, CMP), and initiation of Femara post radiation.  I  discussed the assessment and treatment plan with the patient.  The patient was provided an opportunity to ask questions and all were answered.  The patient agreed with the plan and demonstrated an understanding of the instructions.  The patient was advised to call back or seek an in person evaluation if the symptoms worsen or if the condition fails to improve as anticipated.   Nolon Stalls, MD, PhD  03/16/2019, 11:13 AM   I, Selena Batten, am acting as scribe for [employer]. Mike Gip, MD, PhD.  I, Melissa C. Mike Gip, MD, have reviewed the above documentation for accuracy and completeness, and I agree with the above.

## 2019-03-16 ENCOUNTER — Inpatient Hospital Stay: Payer: Medicare HMO | Attending: Hematology and Oncology | Admitting: Hematology and Oncology

## 2019-03-16 ENCOUNTER — Ambulatory Visit
Admission: RE | Admit: 2019-03-16 | Discharge: 2019-03-16 | Disposition: A | Discharge status: Home / Self Care | Payer: Medicare HMO | Source: Ambulatory Visit | Attending: Radiation Oncology | Admitting: Radiation Oncology

## 2019-03-16 ENCOUNTER — Other Ambulatory Visit: Payer: Self-pay

## 2019-03-16 ENCOUNTER — Encounter: Payer: Self-pay | Admitting: Hematology and Oncology

## 2019-03-16 DIAGNOSIS — C50411 Malignant neoplasm of upper-outer quadrant of right female breast: Secondary | ICD-10-CM | POA: Insufficient documentation

## 2019-03-16 DIAGNOSIS — Z170 Estrogen receptor positive status [ER+]: Secondary | ICD-10-CM | POA: Insufficient documentation

## 2019-03-16 DIAGNOSIS — C773 Secondary and unspecified malignant neoplasm of axilla and upper limb lymph nodes: Secondary | ICD-10-CM | POA: Insufficient documentation

## 2019-03-16 DIAGNOSIS — Z79811 Long term (current) use of aromatase inhibitors: Secondary | ICD-10-CM | POA: Insufficient documentation

## 2019-03-16 DIAGNOSIS — Z79899 Other long term (current) drug therapy: Secondary | ICD-10-CM | POA: Insufficient documentation

## 2019-03-16 NOTE — Patient Instructions (Signed)
Letrozole tablets What is this medicine? LETROZOLE (LET roe zole) blocks the production of estrogen. It is used to treat breast cancer. This medicine may be used for other purposes; ask your health care provider or pharmacist if you have questions. COMMON BRAND NAME(S): Femara What should I tell my health care provider before I take this medicine? They need to know if you have any of these conditions:  high cholesterol  liver disease  osteoporosis (weak bones)  an unusual or allergic reaction to letrozole, other medicines, foods, dyes, or preservatives  pregnant or trying to get pregnant  breast-feeding How should I use this medicine? Take this medicine by mouth with a glass of water. You may take it with or without food. Follow the directions on the prescription label. Take your medicine at regular intervals. Do not take your medicine more often than directed. Do not stop taking except on your doctor's advice. Talk to your pediatrician regarding the use of this medicine in children. Special care may be needed. Overdosage: If you think you have taken too much of this medicine contact a poison control center or emergency room at once. NOTE: This medicine is only for you. Do not share this medicine with others. What if I miss a dose? If you miss a dose, take it as soon as you can. If it is almost time for your next dose, take only that dose. Do not take double or extra doses. What may interact with this medicine? Do not take this medicine with any of the following medications:  estrogens, like hormone replacement therapy or birth control pills This medicine may also interact with the following medications:  dietary supplements such as androstenedione or DHEA  prasterone  tamoxifen This list may not describe all possible interactions. Give your health care provider a list of all the medicines, herbs, non-prescription drugs, or dietary supplements you use. Also tell them if you  smoke, drink alcohol, or use illegal drugs. Some items may interact with your medicine. What should I watch for while using this medicine? Tell your doctor or healthcare professional if your symptoms do not start to get better or if they get worse. Do not become pregnant while taking this medicine or for 3 weeks after stopping it. Women should inform their doctor if they wish to become pregnant or think they might be pregnant. There is a potential for serious side effects to an unborn child. Talk to your health care professional or pharmacist for more information. Do not breast-feed while taking this medicine or for 3 weeks after stopping it. This medicine may interfere with the ability to have a child. Talk with your doctor or health care professional if you are concerned about your fertility. Using this medicine for a long time may increase your risk of low bone mass. Talk to your doctor about bone health. You may get drowsy or dizzy. Do not drive, use machinery, or do anything that needs mental alertness until you know how this medicine affects you. Do not stand or sit up quickly, especially if you are an older patient. This reduces the risk of dizzy or fainting spells. You may need blood work done while you are taking this medicine. What side effects may I notice from receiving this medicine? Side effects that you should report to your doctor or health care professional as soon as possible:  allergic reactions like skin rash, itching, or hives  bone fracture  chest pain  signs and symptoms of a blood clot  such as breathing problems; changes in vision; chest pain; severe, sudden headache; pain, swelling, warmth in the leg; trouble speaking; sudden numbness or weakness of the face, arm or leg  vaginal bleeding Side effects that usually do not require medical attention (report to your doctor or health care professional if they continue or are bothersome):  bone, back, joint, or muscle  pain  dizziness  fatigue  fluid retention  headache  hot flashes, night sweats  nausea  weight gain This list may not describe all possible side effects. Call your doctor for medical advice about side effects. You may report side effects to FDA at 1-800-FDA-1088. Where should I keep my medicine? Keep out of the reach of children. Store between 15 and 30 degrees C (59 and 86 degrees F). Throw away any unused medicine after the expiration date. NOTE: This sheet is a summary. It may not cover all possible information. If you have questions about this medicine, talk to your doctor, pharmacist, or health care provider.  2020 Elsevier/Gold Standard (2015-12-02 11:10:41)  

## 2019-03-16 NOTE — Progress Notes (Signed)
No new changes noted today. The patient Name and DOB has been verified by phone today. 

## 2019-03-17 ENCOUNTER — Other Ambulatory Visit: Payer: Self-pay | Admitting: *Deleted

## 2019-03-17 DIAGNOSIS — C50411 Malignant neoplasm of upper-outer quadrant of right female breast: Secondary | ICD-10-CM

## 2019-03-17 DIAGNOSIS — Z170 Estrogen receptor positive status [ER+]: Secondary | ICD-10-CM

## 2019-03-20 ENCOUNTER — Other Ambulatory Visit: Payer: Self-pay

## 2019-03-20 ENCOUNTER — Ambulatory Visit
Admission: RE | Admit: 2019-03-20 | Discharge: 2019-03-20 | Disposition: A | Discharge status: Home / Self Care | Payer: Medicare HMO | Source: Ambulatory Visit | Attending: Radiation Oncology | Admitting: Radiation Oncology

## 2019-03-20 DIAGNOSIS — C773 Secondary and unspecified malignant neoplasm of axilla and upper limb lymph nodes: Secondary | ICD-10-CM | POA: Diagnosis not present

## 2019-03-20 DIAGNOSIS — C50411 Malignant neoplasm of upper-outer quadrant of right female breast: Secondary | ICD-10-CM | POA: Diagnosis not present

## 2019-03-20 DIAGNOSIS — Z170 Estrogen receptor positive status [ER+]: Secondary | ICD-10-CM | POA: Diagnosis not present

## 2019-03-21 ENCOUNTER — Ambulatory Visit
Admission: RE | Admit: 2019-03-21 | Discharge: 2019-03-21 | Disposition: A | Discharge status: Home / Self Care | Payer: Medicare HMO | Source: Ambulatory Visit | Attending: Radiation Oncology | Admitting: Radiation Oncology

## 2019-03-21 ENCOUNTER — Other Ambulatory Visit: Payer: Self-pay

## 2019-03-21 DIAGNOSIS — C50411 Malignant neoplasm of upper-outer quadrant of right female breast: Secondary | ICD-10-CM | POA: Diagnosis not present

## 2019-03-21 DIAGNOSIS — Z170 Estrogen receptor positive status [ER+]: Secondary | ICD-10-CM | POA: Diagnosis not present

## 2019-03-21 DIAGNOSIS — C773 Secondary and unspecified malignant neoplasm of axilla and upper limb lymph nodes: Secondary | ICD-10-CM | POA: Diagnosis not present

## 2019-03-22 ENCOUNTER — Ambulatory Visit
Admission: RE | Admit: 2019-03-22 | Discharge: 2019-03-22 | Disposition: A | Discharge status: Home / Self Care | Payer: Medicare HMO | Source: Ambulatory Visit | Attending: Radiation Oncology | Admitting: Radiation Oncology

## 2019-03-22 ENCOUNTER — Other Ambulatory Visit: Payer: Self-pay

## 2019-03-22 DIAGNOSIS — Z170 Estrogen receptor positive status [ER+]: Secondary | ICD-10-CM | POA: Diagnosis not present

## 2019-03-22 DIAGNOSIS — C50411 Malignant neoplasm of upper-outer quadrant of right female breast: Secondary | ICD-10-CM | POA: Diagnosis not present

## 2019-03-22 DIAGNOSIS — C773 Secondary and unspecified malignant neoplasm of axilla and upper limb lymph nodes: Secondary | ICD-10-CM | POA: Diagnosis not present

## 2019-03-23 ENCOUNTER — Other Ambulatory Visit: Payer: Self-pay

## 2019-03-23 ENCOUNTER — Ambulatory Visit
Admission: RE | Admit: 2019-03-23 | Discharge: 2019-03-23 | Disposition: A | Discharge status: Home / Self Care | Payer: Medicare HMO | Source: Ambulatory Visit | Attending: Radiation Oncology | Admitting: Radiation Oncology

## 2019-03-23 DIAGNOSIS — Z170 Estrogen receptor positive status [ER+]: Secondary | ICD-10-CM | POA: Diagnosis not present

## 2019-03-23 DIAGNOSIS — C773 Secondary and unspecified malignant neoplasm of axilla and upper limb lymph nodes: Secondary | ICD-10-CM | POA: Diagnosis not present

## 2019-03-23 DIAGNOSIS — C50411 Malignant neoplasm of upper-outer quadrant of right female breast: Secondary | ICD-10-CM | POA: Diagnosis not present

## 2019-03-24 ENCOUNTER — Ambulatory Visit
Admission: RE | Admit: 2019-03-24 | Discharge: 2019-03-24 | Disposition: A | Discharge status: Home / Self Care | Payer: Medicare HMO | Source: Ambulatory Visit | Attending: Radiation Oncology | Admitting: Radiation Oncology

## 2019-03-24 ENCOUNTER — Other Ambulatory Visit: Payer: Self-pay

## 2019-03-24 DIAGNOSIS — C773 Secondary and unspecified malignant neoplasm of axilla and upper limb lymph nodes: Secondary | ICD-10-CM | POA: Diagnosis not present

## 2019-03-24 DIAGNOSIS — Z170 Estrogen receptor positive status [ER+]: Secondary | ICD-10-CM | POA: Diagnosis not present

## 2019-03-24 DIAGNOSIS — C50411 Malignant neoplasm of upper-outer quadrant of right female breast: Secondary | ICD-10-CM | POA: Diagnosis not present

## 2019-03-27 ENCOUNTER — Other Ambulatory Visit: Payer: Self-pay

## 2019-03-27 ENCOUNTER — Ambulatory Visit
Admission: RE | Admit: 2019-03-27 | Discharge: 2019-03-27 | Disposition: A | Discharge status: Home / Self Care | Payer: Medicare HMO | Source: Ambulatory Visit | Attending: Radiation Oncology | Admitting: Radiation Oncology

## 2019-03-27 DIAGNOSIS — C50411 Malignant neoplasm of upper-outer quadrant of right female breast: Secondary | ICD-10-CM | POA: Diagnosis not present

## 2019-03-27 DIAGNOSIS — C773 Secondary and unspecified malignant neoplasm of axilla and upper limb lymph nodes: Secondary | ICD-10-CM | POA: Diagnosis not present

## 2019-03-27 DIAGNOSIS — Z170 Estrogen receptor positive status [ER+]: Secondary | ICD-10-CM | POA: Diagnosis not present

## 2019-03-28 ENCOUNTER — Ambulatory Visit
Admission: RE | Admit: 2019-03-28 | Discharge: 2019-03-28 | Disposition: A | Discharge status: Home / Self Care | Payer: Medicare HMO | Source: Ambulatory Visit | Attending: Radiation Oncology | Admitting: Radiation Oncology

## 2019-03-28 ENCOUNTER — Other Ambulatory Visit: Payer: Self-pay

## 2019-03-28 DIAGNOSIS — C50411 Malignant neoplasm of upper-outer quadrant of right female breast: Secondary | ICD-10-CM | POA: Diagnosis not present

## 2019-03-28 DIAGNOSIS — C773 Secondary and unspecified malignant neoplasm of axilla and upper limb lymph nodes: Secondary | ICD-10-CM | POA: Diagnosis not present

## 2019-03-28 DIAGNOSIS — Z170 Estrogen receptor positive status [ER+]: Secondary | ICD-10-CM | POA: Diagnosis not present

## 2019-03-29 ENCOUNTER — Ambulatory Visit
Admission: RE | Admit: 2019-03-29 | Discharge: 2019-03-29 | Disposition: A | Discharge status: Home / Self Care | Payer: Medicare HMO | Source: Ambulatory Visit | Attending: Radiation Oncology | Admitting: Radiation Oncology

## 2019-03-29 ENCOUNTER — Other Ambulatory Visit: Payer: Self-pay

## 2019-03-29 DIAGNOSIS — C50411 Malignant neoplasm of upper-outer quadrant of right female breast: Secondary | ICD-10-CM | POA: Diagnosis not present

## 2019-03-29 DIAGNOSIS — Z170 Estrogen receptor positive status [ER+]: Secondary | ICD-10-CM | POA: Diagnosis not present

## 2019-03-29 DIAGNOSIS — C773 Secondary and unspecified malignant neoplasm of axilla and upper limb lymph nodes: Secondary | ICD-10-CM | POA: Diagnosis not present

## 2019-03-30 ENCOUNTER — Ambulatory Visit
Admission: RE | Admit: 2019-03-30 | Discharge: 2019-03-30 | Disposition: A | Discharge status: Home / Self Care | Payer: Medicare HMO | Source: Ambulatory Visit | Attending: Radiation Oncology | Admitting: Radiation Oncology

## 2019-03-30 ENCOUNTER — Other Ambulatory Visit: Payer: Self-pay

## 2019-03-30 DIAGNOSIS — C773 Secondary and unspecified malignant neoplasm of axilla and upper limb lymph nodes: Secondary | ICD-10-CM | POA: Diagnosis not present

## 2019-03-30 DIAGNOSIS — C50411 Malignant neoplasm of upper-outer quadrant of right female breast: Secondary | ICD-10-CM | POA: Diagnosis not present

## 2019-03-30 DIAGNOSIS — Z170 Estrogen receptor positive status [ER+]: Secondary | ICD-10-CM | POA: Diagnosis not present

## 2019-03-31 ENCOUNTER — Other Ambulatory Visit: Payer: Self-pay

## 2019-03-31 ENCOUNTER — Ambulatory Visit
Admission: RE | Admit: 2019-03-31 | Discharge: 2019-03-31 | Disposition: A | Discharge status: Home / Self Care | Payer: Medicare HMO | Source: Ambulatory Visit | Attending: Radiation Oncology | Admitting: Radiation Oncology

## 2019-03-31 DIAGNOSIS — C773 Secondary and unspecified malignant neoplasm of axilla and upper limb lymph nodes: Secondary | ICD-10-CM | POA: Diagnosis not present

## 2019-03-31 DIAGNOSIS — Z170 Estrogen receptor positive status [ER+]: Secondary | ICD-10-CM | POA: Diagnosis not present

## 2019-03-31 DIAGNOSIS — C50411 Malignant neoplasm of upper-outer quadrant of right female breast: Secondary | ICD-10-CM | POA: Diagnosis not present

## 2019-04-03 ENCOUNTER — Inpatient Hospital Stay: Payer: Medicare HMO

## 2019-04-03 ENCOUNTER — Other Ambulatory Visit: Payer: Self-pay | Admitting: Internal Medicine

## 2019-04-03 ENCOUNTER — Ambulatory Visit
Admission: RE | Admit: 2019-04-03 | Discharge: 2019-04-03 | Disposition: A | Discharge status: Home / Self Care | Payer: Medicare HMO | Source: Ambulatory Visit | Attending: Radiation Oncology | Admitting: Radiation Oncology

## 2019-04-03 ENCOUNTER — Other Ambulatory Visit: Payer: Self-pay

## 2019-04-03 DIAGNOSIS — Z170 Estrogen receptor positive status [ER+]: Secondary | ICD-10-CM | POA: Diagnosis not present

## 2019-04-03 DIAGNOSIS — C50411 Malignant neoplasm of upper-outer quadrant of right female breast: Secondary | ICD-10-CM

## 2019-04-03 DIAGNOSIS — Z79899 Other long term (current) drug therapy: Secondary | ICD-10-CM | POA: Diagnosis not present

## 2019-04-03 DIAGNOSIS — Z79811 Long term (current) use of aromatase inhibitors: Secondary | ICD-10-CM | POA: Diagnosis not present

## 2019-04-03 DIAGNOSIS — C773 Secondary and unspecified malignant neoplasm of axilla and upper limb lymph nodes: Secondary | ICD-10-CM | POA: Diagnosis not present

## 2019-04-03 LAB — CBC
HCT: 43.4 % (ref 36.0–46.0)
Hemoglobin: 13.8 g/dL (ref 12.0–15.0)
MCH: 30.5 pg (ref 26.0–34.0)
MCHC: 31.8 g/dL (ref 30.0–36.0)
MCV: 96 fL (ref 80.0–100.0)
Platelets: 185 10*3/uL (ref 150–400)
RBC: 4.52 MIL/uL (ref 3.87–5.11)
RDW: 13.7 % (ref 11.5–15.5)
WBC: 5.3 10*3/uL (ref 4.0–10.5)
nRBC: 0 % (ref 0.0–0.2)

## 2019-04-04 ENCOUNTER — Other Ambulatory Visit: Payer: Self-pay

## 2019-04-04 ENCOUNTER — Ambulatory Visit
Admission: RE | Admit: 2019-04-04 | Discharge: 2019-04-04 | Disposition: A | Discharge status: Home / Self Care | Payer: Medicare HMO | Source: Ambulatory Visit | Attending: Radiation Oncology | Admitting: Radiation Oncology

## 2019-04-04 DIAGNOSIS — Z170 Estrogen receptor positive status [ER+]: Secondary | ICD-10-CM | POA: Diagnosis not present

## 2019-04-04 DIAGNOSIS — C50411 Malignant neoplasm of upper-outer quadrant of right female breast: Secondary | ICD-10-CM | POA: Diagnosis not present

## 2019-04-04 DIAGNOSIS — C773 Secondary and unspecified malignant neoplasm of axilla and upper limb lymph nodes: Secondary | ICD-10-CM | POA: Diagnosis not present

## 2019-04-05 ENCOUNTER — Ambulatory Visit
Admission: RE | Admit: 2019-04-05 | Discharge: 2019-04-05 | Disposition: A | Discharge status: Home / Self Care | Payer: Medicare HMO | Source: Ambulatory Visit | Attending: Radiation Oncology | Admitting: Radiation Oncology

## 2019-04-05 ENCOUNTER — Other Ambulatory Visit: Payer: Self-pay

## 2019-04-05 DIAGNOSIS — C50411 Malignant neoplasm of upper-outer quadrant of right female breast: Secondary | ICD-10-CM | POA: Diagnosis not present

## 2019-04-05 DIAGNOSIS — C773 Secondary and unspecified malignant neoplasm of axilla and upper limb lymph nodes: Secondary | ICD-10-CM | POA: Diagnosis not present

## 2019-04-10 ENCOUNTER — Ambulatory Visit
Admission: RE | Admit: 2019-04-10 | Discharge: 2019-04-10 | Disposition: A | Discharge status: Home / Self Care | Payer: Medicare HMO | Source: Ambulatory Visit | Attending: Radiation Oncology | Admitting: Radiation Oncology

## 2019-04-10 ENCOUNTER — Other Ambulatory Visit: Payer: Self-pay

## 2019-04-10 ENCOUNTER — Ambulatory Visit: Payer: Medicare HMO | Admitting: Pulmonary Disease

## 2019-04-10 DIAGNOSIS — C50411 Malignant neoplasm of upper-outer quadrant of right female breast: Secondary | ICD-10-CM | POA: Diagnosis not present

## 2019-04-10 DIAGNOSIS — Z170 Estrogen receptor positive status [ER+]: Secondary | ICD-10-CM | POA: Diagnosis not present

## 2019-04-10 DIAGNOSIS — C773 Secondary and unspecified malignant neoplasm of axilla and upper limb lymph nodes: Secondary | ICD-10-CM | POA: Diagnosis not present

## 2019-04-11 ENCOUNTER — Ambulatory Visit
Admission: RE | Admit: 2019-04-11 | Discharge: 2019-04-11 | Disposition: A | Discharge status: Home / Self Care | Payer: Medicare HMO | Source: Ambulatory Visit | Attending: Radiation Oncology | Admitting: Radiation Oncology

## 2019-04-11 ENCOUNTER — Other Ambulatory Visit: Payer: Self-pay

## 2019-04-11 DIAGNOSIS — C50411 Malignant neoplasm of upper-outer quadrant of right female breast: Secondary | ICD-10-CM | POA: Diagnosis not present

## 2019-04-11 DIAGNOSIS — C773 Secondary and unspecified malignant neoplasm of axilla and upper limb lymph nodes: Secondary | ICD-10-CM | POA: Diagnosis not present

## 2019-04-11 DIAGNOSIS — Z170 Estrogen receptor positive status [ER+]: Secondary | ICD-10-CM | POA: Diagnosis not present

## 2019-04-12 ENCOUNTER — Other Ambulatory Visit: Payer: Self-pay

## 2019-04-12 ENCOUNTER — Ambulatory Visit
Admission: RE | Admit: 2019-04-12 | Discharge: 2019-04-12 | Disposition: A | Discharge status: Home / Self Care | Payer: Medicare HMO | Source: Ambulatory Visit | Attending: Radiation Oncology | Admitting: Radiation Oncology

## 2019-04-12 DIAGNOSIS — C773 Secondary and unspecified malignant neoplasm of axilla and upper limb lymph nodes: Secondary | ICD-10-CM | POA: Diagnosis not present

## 2019-04-12 DIAGNOSIS — Z170 Estrogen receptor positive status [ER+]: Secondary | ICD-10-CM | POA: Diagnosis not present

## 2019-04-12 DIAGNOSIS — C50411 Malignant neoplasm of upper-outer quadrant of right female breast: Secondary | ICD-10-CM | POA: Diagnosis not present

## 2019-04-13 ENCOUNTER — Other Ambulatory Visit: Payer: Self-pay

## 2019-04-13 ENCOUNTER — Ambulatory Visit
Admission: RE | Admit: 2019-04-13 | Discharge: 2019-04-13 | Disposition: A | Discharge status: Home / Self Care | Payer: Medicare HMO | Source: Ambulatory Visit | Attending: Radiation Oncology | Admitting: Radiation Oncology

## 2019-04-13 DIAGNOSIS — C773 Secondary and unspecified malignant neoplasm of axilla and upper limb lymph nodes: Secondary | ICD-10-CM | POA: Diagnosis not present

## 2019-04-13 DIAGNOSIS — Z170 Estrogen receptor positive status [ER+]: Secondary | ICD-10-CM | POA: Diagnosis not present

## 2019-04-13 DIAGNOSIS — C50411 Malignant neoplasm of upper-outer quadrant of right female breast: Secondary | ICD-10-CM | POA: Diagnosis not present

## 2019-04-14 ENCOUNTER — Ambulatory Visit
Admission: RE | Admit: 2019-04-14 | Discharge: 2019-04-14 | Disposition: A | Discharge status: Home / Self Care | Payer: Medicare HMO | Source: Ambulatory Visit | Attending: Radiation Oncology | Admitting: Radiation Oncology

## 2019-04-14 ENCOUNTER — Other Ambulatory Visit: Payer: Self-pay

## 2019-04-14 DIAGNOSIS — C773 Secondary and unspecified malignant neoplasm of axilla and upper limb lymph nodes: Secondary | ICD-10-CM | POA: Diagnosis not present

## 2019-04-14 DIAGNOSIS — C50411 Malignant neoplasm of upper-outer quadrant of right female breast: Secondary | ICD-10-CM | POA: Diagnosis not present

## 2019-04-14 DIAGNOSIS — Z170 Estrogen receptor positive status [ER+]: Secondary | ICD-10-CM | POA: Diagnosis not present

## 2019-04-16 ENCOUNTER — Other Ambulatory Visit: Payer: Self-pay

## 2019-04-16 DIAGNOSIS — Z170 Estrogen receptor positive status [ER+]: Secondary | ICD-10-CM

## 2019-04-17 ENCOUNTER — Other Ambulatory Visit: Payer: Self-pay

## 2019-04-17 ENCOUNTER — Ambulatory Visit
Admission: RE | Admit: 2019-04-17 | Discharge: 2019-04-17 | Disposition: A | Discharge status: Home / Self Care | Payer: Medicare HMO | Source: Ambulatory Visit | Attending: Radiation Oncology | Admitting: Radiation Oncology

## 2019-04-17 ENCOUNTER — Inpatient Hospital Stay: Payer: Medicare HMO | Attending: Radiation Oncology

## 2019-04-17 DIAGNOSIS — C773 Secondary and unspecified malignant neoplasm of axilla and upper limb lymph nodes: Secondary | ICD-10-CM | POA: Diagnosis not present

## 2019-04-17 DIAGNOSIS — C50411 Malignant neoplasm of upper-outer quadrant of right female breast: Secondary | ICD-10-CM | POA: Diagnosis not present

## 2019-04-17 DIAGNOSIS — Z170 Estrogen receptor positive status [ER+]: Secondary | ICD-10-CM | POA: Diagnosis not present

## 2019-04-17 LAB — CBC
HCT: 45.1 % (ref 36.0–46.0)
Hemoglobin: 14.3 g/dL (ref 12.0–15.0)
MCH: 30.8 pg (ref 26.0–34.0)
MCHC: 31.7 g/dL (ref 30.0–36.0)
MCV: 97 fL (ref 80.0–100.0)
Platelets: 167 10*3/uL (ref 150–400)
RBC: 4.65 MIL/uL (ref 3.87–5.11)
RDW: 14.2 % (ref 11.5–15.5)
WBC: 5.8 10*3/uL (ref 4.0–10.5)
nRBC: 0 % (ref 0.0–0.2)

## 2019-04-18 ENCOUNTER — Ambulatory Visit
Admission: RE | Admit: 2019-04-18 | Discharge: 2019-04-18 | Disposition: A | Discharge status: Home / Self Care | Payer: Medicare HMO | Source: Ambulatory Visit | Attending: Radiation Oncology | Admitting: Radiation Oncology

## 2019-04-18 ENCOUNTER — Other Ambulatory Visit: Payer: Self-pay

## 2019-04-18 DIAGNOSIS — C50411 Malignant neoplasm of upper-outer quadrant of right female breast: Secondary | ICD-10-CM | POA: Diagnosis not present

## 2019-04-18 DIAGNOSIS — C773 Secondary and unspecified malignant neoplasm of axilla and upper limb lymph nodes: Secondary | ICD-10-CM | POA: Diagnosis not present

## 2019-04-18 DIAGNOSIS — Z170 Estrogen receptor positive status [ER+]: Secondary | ICD-10-CM | POA: Diagnosis not present

## 2019-04-19 ENCOUNTER — Ambulatory Visit
Admission: RE | Admit: 2019-04-19 | Discharge: 2019-04-19 | Disposition: A | Discharge status: Home / Self Care | Payer: Medicare HMO | Source: Ambulatory Visit | Attending: Radiation Oncology | Admitting: Radiation Oncology

## 2019-04-19 ENCOUNTER — Other Ambulatory Visit: Payer: Self-pay

## 2019-04-19 DIAGNOSIS — Z170 Estrogen receptor positive status [ER+]: Secondary | ICD-10-CM | POA: Diagnosis not present

## 2019-04-19 DIAGNOSIS — C50411 Malignant neoplasm of upper-outer quadrant of right female breast: Secondary | ICD-10-CM | POA: Diagnosis not present

## 2019-04-19 DIAGNOSIS — C773 Secondary and unspecified malignant neoplasm of axilla and upper limb lymph nodes: Secondary | ICD-10-CM | POA: Diagnosis not present

## 2019-04-20 ENCOUNTER — Ambulatory Visit
Admission: RE | Admit: 2019-04-20 | Discharge: 2019-04-20 | Disposition: A | Discharge status: Home / Self Care | Payer: Medicare HMO | Source: Ambulatory Visit | Attending: Radiation Oncology | Admitting: Radiation Oncology

## 2019-04-20 ENCOUNTER — Other Ambulatory Visit: Payer: Self-pay

## 2019-04-20 ENCOUNTER — Other Ambulatory Visit: Payer: Self-pay | Admitting: *Deleted

## 2019-04-20 DIAGNOSIS — Z170 Estrogen receptor positive status [ER+]: Secondary | ICD-10-CM | POA: Diagnosis not present

## 2019-04-20 DIAGNOSIS — C773 Secondary and unspecified malignant neoplasm of axilla and upper limb lymph nodes: Secondary | ICD-10-CM | POA: Diagnosis not present

## 2019-04-20 DIAGNOSIS — C50411 Malignant neoplasm of upper-outer quadrant of right female breast: Secondary | ICD-10-CM | POA: Diagnosis not present

## 2019-04-20 MED ORDER — SILVER SULFADIAZINE 1 % EX CREA: 1.0000 "application " | TOPICAL_CREAM | Freq: Two times a day (BID) | CUTANEOUS | 2 refills | Status: DC

## 2019-04-21 ENCOUNTER — Other Ambulatory Visit: Payer: Self-pay

## 2019-04-21 ENCOUNTER — Ambulatory Visit
Admission: RE | Admit: 2019-04-21 | Discharge: 2019-04-21 | Disposition: A | Discharge status: Home / Self Care | Payer: Medicare HMO | Source: Ambulatory Visit | Attending: Radiation Oncology | Admitting: Radiation Oncology

## 2019-04-21 DIAGNOSIS — Z170 Estrogen receptor positive status [ER+]: Secondary | ICD-10-CM | POA: Diagnosis not present

## 2019-04-21 DIAGNOSIS — C50411 Malignant neoplasm of upper-outer quadrant of right female breast: Secondary | ICD-10-CM | POA: Diagnosis not present

## 2019-04-21 DIAGNOSIS — C773 Secondary and unspecified malignant neoplasm of axilla and upper limb lymph nodes: Secondary | ICD-10-CM | POA: Diagnosis not present

## 2019-04-24 ENCOUNTER — Other Ambulatory Visit: Payer: Self-pay

## 2019-04-24 ENCOUNTER — Ambulatory Visit
Admission: RE | Admit: 2019-04-24 | Discharge: 2019-04-24 | Disposition: A | Discharge status: Home / Self Care | Payer: Medicare HMO | Source: Ambulatory Visit | Attending: Radiation Oncology | Admitting: Radiation Oncology

## 2019-04-24 DIAGNOSIS — C773 Secondary and unspecified malignant neoplasm of axilla and upper limb lymph nodes: Secondary | ICD-10-CM | POA: Diagnosis not present

## 2019-04-24 DIAGNOSIS — C50411 Malignant neoplasm of upper-outer quadrant of right female breast: Secondary | ICD-10-CM | POA: Diagnosis not present

## 2019-04-25 ENCOUNTER — Ambulatory Visit: Payer: Medicare HMO

## 2019-04-25 ENCOUNTER — Other Ambulatory Visit: Payer: Self-pay

## 2019-04-25 DIAGNOSIS — C773 Secondary and unspecified malignant neoplasm of axilla and upper limb lymph nodes: Secondary | ICD-10-CM | POA: Diagnosis not present

## 2019-04-25 DIAGNOSIS — C50411 Malignant neoplasm of upper-outer quadrant of right female breast: Secondary | ICD-10-CM | POA: Diagnosis not present

## 2019-04-26 ENCOUNTER — Ambulatory Visit: Payer: Medicare HMO

## 2019-04-26 ENCOUNTER — Other Ambulatory Visit: Payer: Self-pay

## 2019-04-26 DIAGNOSIS — C773 Secondary and unspecified malignant neoplasm of axilla and upper limb lymph nodes: Secondary | ICD-10-CM | POA: Diagnosis not present

## 2019-04-26 DIAGNOSIS — C50411 Malignant neoplasm of upper-outer quadrant of right female breast: Secondary | ICD-10-CM | POA: Diagnosis not present

## 2019-04-26 DIAGNOSIS — Z170 Estrogen receptor positive status [ER+]: Secondary | ICD-10-CM | POA: Diagnosis not present

## 2019-04-27 ENCOUNTER — Ambulatory Visit: Payer: Medicare HMO

## 2019-04-27 ENCOUNTER — Other Ambulatory Visit: Payer: Self-pay

## 2019-04-27 DIAGNOSIS — C50411 Malignant neoplasm of upper-outer quadrant of right female breast: Secondary | ICD-10-CM | POA: Diagnosis not present

## 2019-04-27 DIAGNOSIS — C773 Secondary and unspecified malignant neoplasm of axilla and upper limb lymph nodes: Secondary | ICD-10-CM | POA: Diagnosis not present

## 2019-04-28 ENCOUNTER — Ambulatory Visit: Payer: Medicare HMO

## 2019-04-28 ENCOUNTER — Other Ambulatory Visit: Payer: Self-pay

## 2019-04-28 DIAGNOSIS — C773 Secondary and unspecified malignant neoplasm of axilla and upper limb lymph nodes: Secondary | ICD-10-CM | POA: Diagnosis not present

## 2019-04-28 DIAGNOSIS — C50411 Malignant neoplasm of upper-outer quadrant of right female breast: Secondary | ICD-10-CM | POA: Diagnosis not present

## 2019-05-01 ENCOUNTER — Other Ambulatory Visit: Payer: Self-pay

## 2019-05-01 ENCOUNTER — Ambulatory Visit: Payer: Medicare HMO

## 2019-05-01 ENCOUNTER — Inpatient Hospital Stay: Payer: Medicare HMO

## 2019-05-01 ENCOUNTER — Telehealth: Payer: Self-pay

## 2019-05-01 DIAGNOSIS — C50411 Malignant neoplasm of upper-outer quadrant of right female breast: Secondary | ICD-10-CM

## 2019-05-01 LAB — CBC
HCT: 46.1 % — ABNORMAL HIGH (ref 36.0–46.0)
Hemoglobin: 14.3 g/dL (ref 12.0–15.0)
MCH: 30.6 pg (ref 26.0–34.0)
MCHC: 31 g/dL (ref 30.0–36.0)
MCV: 98.7 fL (ref 80.0–100.0)
Platelets: 154 10*3/uL (ref 150–400)
RBC: 4.67 MIL/uL (ref 3.87–5.11)
RDW: 14.3 % (ref 11.5–15.5)
WBC: 4.6 10*3/uL (ref 4.0–10.5)
nRBC: 0 % (ref 0.0–0.2)

## 2019-05-01 NOTE — Telephone Encounter (Signed)
Call received from [facility] stating that patient's blood sample failed and that patient will need a new sample collected in order to finish testing.   Contacted patient and she agrees to come in tomorrow for lab draw.

## 2019-05-02 ENCOUNTER — Inpatient Hospital Stay: Payer: Medicare HMO | Attending: Hematology and Oncology

## 2019-05-02 ENCOUNTER — Other Ambulatory Visit: Payer: Self-pay | Admitting: *Deleted

## 2019-05-02 ENCOUNTER — Ambulatory Visit: Payer: Medicare HMO

## 2019-05-02 DIAGNOSIS — C50411 Malignant neoplasm of upper-outer quadrant of right female breast: Secondary | ICD-10-CM | POA: Diagnosis not present

## 2019-05-03 ENCOUNTER — Ambulatory Visit: Payer: Medicare HMO

## 2019-05-04 ENCOUNTER — Ambulatory Visit: Payer: Medicare HMO

## 2019-05-08 ENCOUNTER — Ambulatory Visit
Admission: RE | Admit: 2019-05-08 | Discharge: 2019-05-08 | Disposition: A | Discharge status: Home / Self Care | Payer: Medicare HMO | Source: Ambulatory Visit | Attending: Radiation Oncology | Admitting: Radiation Oncology

## 2019-05-08 ENCOUNTER — Other Ambulatory Visit: Payer: Self-pay

## 2019-05-08 ENCOUNTER — Ambulatory Visit: Payer: Medicare HMO

## 2019-05-08 DIAGNOSIS — C50411 Malignant neoplasm of upper-outer quadrant of right female breast: Secondary | ICD-10-CM | POA: Diagnosis not present

## 2019-05-08 DIAGNOSIS — C773 Secondary and unspecified malignant neoplasm of axilla and upper limb lymph nodes: Secondary | ICD-10-CM | POA: Diagnosis not present

## 2019-05-08 DIAGNOSIS — Z170 Estrogen receptor positive status [ER+]: Secondary | ICD-10-CM | POA: Diagnosis not present

## 2019-05-09 ENCOUNTER — Ambulatory Visit
Admission: RE | Admit: 2019-05-09 | Discharge: 2019-05-09 | Disposition: A | Discharge status: Home / Self Care | Payer: Medicare HMO | Source: Ambulatory Visit | Attending: Radiation Oncology | Admitting: Radiation Oncology

## 2019-05-09 ENCOUNTER — Ambulatory Visit: Payer: Medicare HMO

## 2019-05-09 ENCOUNTER — Other Ambulatory Visit: Payer: Self-pay

## 2019-05-09 DIAGNOSIS — C773 Secondary and unspecified malignant neoplasm of axilla and upper limb lymph nodes: Secondary | ICD-10-CM | POA: Diagnosis not present

## 2019-05-09 DIAGNOSIS — Z170 Estrogen receptor positive status [ER+]: Secondary | ICD-10-CM | POA: Diagnosis not present

## 2019-05-09 DIAGNOSIS — Z853 Personal history of malignant neoplasm of breast: Secondary | ICD-10-CM | POA: Diagnosis not present

## 2019-05-09 DIAGNOSIS — C50411 Malignant neoplasm of upper-outer quadrant of right female breast: Secondary | ICD-10-CM | POA: Diagnosis not present

## 2019-05-10 ENCOUNTER — Other Ambulatory Visit: Payer: Self-pay

## 2019-05-10 ENCOUNTER — Ambulatory Visit: Payer: Medicare HMO

## 2019-05-10 ENCOUNTER — Ambulatory Visit
Admission: RE | Admit: 2019-05-10 | Discharge: 2019-05-10 | Disposition: A | Discharge status: Home / Self Care | Payer: Medicare HMO | Source: Ambulatory Visit | Attending: Radiation Oncology | Admitting: Radiation Oncology

## 2019-05-10 DIAGNOSIS — C773 Secondary and unspecified malignant neoplasm of axilla and upper limb lymph nodes: Secondary | ICD-10-CM | POA: Diagnosis not present

## 2019-05-10 DIAGNOSIS — Z170 Estrogen receptor positive status [ER+]: Secondary | ICD-10-CM | POA: Diagnosis not present

## 2019-05-10 DIAGNOSIS — C50411 Malignant neoplasm of upper-outer quadrant of right female breast: Secondary | ICD-10-CM | POA: Diagnosis not present

## 2019-05-11 ENCOUNTER — Ambulatory Visit
Admission: RE | Admit: 2019-05-11 | Discharge: 2019-05-11 | Disposition: A | Discharge status: Home / Self Care | Payer: Medicare HMO | Source: Ambulatory Visit | Attending: Radiation Oncology | Admitting: Radiation Oncology

## 2019-05-11 ENCOUNTER — Ambulatory Visit: Payer: Medicare HMO

## 2019-05-11 ENCOUNTER — Other Ambulatory Visit: Payer: Self-pay

## 2019-05-11 DIAGNOSIS — C50411 Malignant neoplasm of upper-outer quadrant of right female breast: Secondary | ICD-10-CM | POA: Diagnosis not present

## 2019-05-11 DIAGNOSIS — C773 Secondary and unspecified malignant neoplasm of axilla and upper limb lymph nodes: Secondary | ICD-10-CM | POA: Diagnosis not present

## 2019-05-11 DIAGNOSIS — Z170 Estrogen receptor positive status [ER+]: Secondary | ICD-10-CM | POA: Diagnosis not present

## 2019-05-15 ENCOUNTER — Other Ambulatory Visit: Payer: Self-pay

## 2019-05-15 ENCOUNTER — Ambulatory Visit
Admission: RE | Admit: 2019-05-15 | Discharge: 2019-05-15 | Disposition: A | Discharge status: Home / Self Care | Payer: Medicare HMO | Source: Ambulatory Visit | Attending: Radiation Oncology | Admitting: Radiation Oncology

## 2019-05-15 DIAGNOSIS — Z170 Estrogen receptor positive status [ER+]: Secondary | ICD-10-CM | POA: Diagnosis not present

## 2019-05-15 DIAGNOSIS — C773 Secondary and unspecified malignant neoplasm of axilla and upper limb lymph nodes: Secondary | ICD-10-CM | POA: Insufficient documentation

## 2019-05-15 DIAGNOSIS — C50411 Malignant neoplasm of upper-outer quadrant of right female breast: Secondary | ICD-10-CM | POA: Insufficient documentation

## 2019-05-21 NOTE — Progress Notes (Signed)
[facility]  [address] [place], [place] [address] Phone: [phone]  Fax: [phone]   Clinic Day:  05/22/2019  Referring physician: Baxter Hire, MD  Chief Complaint: Jill Bond is a 69 y.o. female with sarcoid andstage IAright breastcancer who is seen for assessment s/p radiation and prior to initiation of Femara.    HPI: The patient was last seen in the medical oncology clinic via telemedicine on 03/16/2019. At that time, she was feeling better. Right breast infection had cleared.  She was seen for follow up with Dr. Windell Moment on 05/09/2019. She was to continue using silvadene cream and was to follow up in 3 months.  She had been seen by Dr Baruch Gouty on 03/01/2019.  Plan was for 5040 cGy in 28 fractions with a scar boost of 1400 cGy using electron beam.  She received right breast radiation from 03/20/2019 - 05/15/2019.  CBC followed 04/03/2019: Hematocrit 43.3, hemoglobin 13.8, MCV 96.0, platelets 185,000, WBC 5300.  04/17/2019: Hematocrit 45.1, hemoglobin 14.3, MCV 97.0, platelets 167,000, WBC 5800.  05/01/2019:Hematocrit 46.1, hemoglobin 14.3, MCV 98.7, platelets 154,000, WBC 4600.   She had a right shoulder skin cancer removed 2 days before initiation of radiation.  She believes that this was a squamous cell skin cancer.  During the interim, she has felt "alright".  She states that she developed open blistering during radiation.  She had continued her methotrexate during radiation.  Her methotrexate has been on hold for the past 2 to 3 weeks.  Chest wall blistering has improved.   Past Medical History:  Diagnosis Date  . Anxiety   . Arthritis   . Breast cancer ([place]) 01/2019   invasive mammary carcinoma   . Chronic kidney disease 2006   Left Nephrectomy  . Complication of anesthesia    PONV  . Constipation   . Depression   . GERD (gastroesophageal reflux disease)   . Headache   . Hypertension   . PONV  (postoperative nausea and vomiting)   . Rotator cuff disorder    LEFT  . Sarcoidosis   . Shortness of breath dyspnea    with exertion  . Sleep apnea     Past Surgical History:  Procedure Laterality Date  . ABDOMINAL HYSTERECTOMY     PARTIAL  . ACHILLES TENDON SURGERY Left 02/22/2015   Procedure: Secondary ACHILLES TENDON REPAIR;  Surgeon: Albertine Patricia, DPM;  Location: [facility] ORS;  Service: Podiatry;  Laterality: Left;  . Bladder tac    . BREAST BIOPSY Right 01/23/2019   affirm stereo bx of calcs with x marker, ductal hyperplasia  . BREAST BIOPSY Right 01/23/2019   [place] bx of mass with coil marker, invasive mammary carcinoma  . BREAST EXCISIONAL BIOPSY Left 2003   neg  . BREAST LUMPECTOMY Right 02/06/2019  . COLONOSCOPY  2013  . JOINT REPLACEMENT Left 2007   Total Knee Replacement  . KIDNEY CYST REMOVAL Left   . OSTECTOMY Left 02/22/2015   Procedure: OSTECTOMY;  Surgeon: Albertine Patricia, DPM;  Location: [facility] ORS;  Service: Podiatry;  Laterality: Left;  . PARTIAL MASTECTOMY WITH NEEDLE LOCALIZATION AND AXILLARY SENTINEL LYMPH NODE BX Right 02/06/2019   Procedure: PARTIAL MASTECTOMY WITH NEEDLE LOCALIZATION AND AXILLARY SENTINEL LYMPH NODE BX;  Surgeon: Herbert Pun, MD;  Location: [facility] ORS;  Service: General;  Laterality: Right;  . SHOULDER ARTHROSCOPY W/ ROTATOR CUFF REPAIR Left YRS AGO  . TONSILLECTOMY  AGE 38  . TOTAL KNEE ARTHROPLASTY Right 10/22/2015   Procedure: RIGHT  TOTAL KNEE ARTHROPLASTY;  Surgeon: Paralee Cancel, MD;  Location: [facility] ORS;  Service: Orthopedics;  Laterality: Right;  . TOTAL KNEE ARTHROPLASTY Right    10/22/2015    Family History  Problem Relation Age of Onset  . Breast cancer Mother 36  . Heart failure Father   . Prostate cancer Father     Social History:  reports that she has never smoked. She has never used smokeless tobacco. She reports that she does not drink alcohol or use drugs. She denies any exposure to radiation or toxins. 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 Her husband died on 13-Sep-2013.Her first cousinis [name] [phone]).Her daughter, Pat,is her medical power of attorney.  She lives in [place].  The patient is alone today.  Allergies:  Allergies  Allergen Reactions  . Codeine Nausea And Vomiting  . Penicillins Hives and Other (See Comments)    Has patient had a PCN reaction causing immediate rash, facial/tongue/throat swelling, SOB or lightheadedness with hypotension: no Has patient had a PCN reaction causing severe rash involving mucus membranes or skin necrosis: no Has patient had a PCN reaction that required hospitalization no Has patient had a PCN reaction occurring within the last 10 years: no If all of the above answers are "NO", then may proceed with Cephalosporin use.     Current Medications: Current Outpatient Medications  Medication Sig Dispense Refill  . amLODipine (NORVASC) 10 MG tablet Take 10 mg by mouth at bedtime.    . cyclobenzaprine (FLEXERIL) 10 MG tablet Take 1 tablet by mouth as needed.    . diclofenac sodium (VOLTAREN) 1 % GEL Apply 1 application topically 2 (two) times daily as needed (foot pain).    Marland Kitchen docusate sodium (COLACE) 100 MG capsule Take 250 mg by mouth at bedtime.     . metoprolol succinate (TOPROL-XL) 100 MG 24 hr tablet Take 100 mg by mouth at bedtime.     . OMEGA-3 FATTY ACIDS PO Take 1,000 mg by mouth daily.     Marland Kitchen omeprazole (PRILOSEC) 20 MG capsule Take 20 mg by mouth daily.     . silver sulfADIAZINE (SILVADENE) 1 % cream Apply 1 application topically 2 (two) times daily. 50 g 2  . gabapentin (NEURONTIN) 300 MG capsule Take 1 capsule (300 mg total) by mouth 3 (three) times daily for 10 days. (Patient not taking: Reported on 03/01/2019) 30 capsule 0  . methotrexate (RHEUMATREX) 2.5 MG tablet TAKE 4 TABLETS (10 MG TOTAL) BY MOUTH ONCE A WEEK. CAUTION:CHEMOTHERAPY. PROTECT FROM LIGHT. (Patient not taking: Reported on 05/22/2019) 52 tablet  2  . Multiple Vitamin (MULTIVITAMIN WITH MINERALS) TABS tablet Take 0.5 tablets by mouth daily.      No current facility-administered medications for this visit.    Review of Systems  Constitutional: Positive for weight loss (4 pounds). Negative for chills, diaphoresis, fever and malaise/fatigue.       Feels "alright".  HENT: Negative for congestion, ear pain, hearing loss, nosebleeds, sinus pain and sore throat.        Chronic rhinorrhea.  Eyes: Negative for blurred vision and double vision.       Floaters.  Respiratory: Negative for hemoptysis, sputum production, shortness of breath and wheezing.        Sleep apnea on CPAP at night.  Plans to see a new pulmonologist.  Cardiovascular: Negative.  Negative for chest pain, palpitations, orthopnea, claudication and leg swelling.  Gastrointestinal: Negative.  Negative for abdominal pain, blood in stool,  constipation, diarrhea, melena, nausea and vomiting.  Genitourinary: Negative.  Negative for dysuria, frequency, hematuria and urgency.  Musculoskeletal: Positive for joint pain (right knee bothered by the cold). Negative for back pain (chronic), falls, myalgias and neck pain.  Skin: Negative for itching and rash.       Blistering right breast s/p radiation.  Neurological: Negative.  Negative for dizziness, tingling, sensory change, speech change, focal weakness, weakness and headaches (resolved).  Endo/Heme/Allergies: Negative.  Does not bruise/bleed easily.  Psychiatric/Behavioral: Positive for memory loss. Negative for depression. The patient is not nervous/anxious and does not have insomnia.   All other systems reviewed and are negative.  Performance status (ECOG):  1  Vitals Blood pressure 138/83, pulse 88, temperature 98.1 F (36.7 C), temperature source Oral, resp. rate 18, weight 234 lb 7.4 oz (106.4 kg), SpO2 100 %.   Physical Exam  Constitutional: She is oriented to person, place, and time. She appears well-developed and  well-nourished. No distress.  HENT:  Head: Atraumatic.  Mouth/Throat: Oropharynx is clear and moist. No oropharyngeal exudate.  Long brown hair.  Mask.  Eyes: Conjunctivae and EOM are normal. No scleral icterus.  Glasses.  Blue eyes.  Neck: No JVD present.  Cardiovascular: Normal rate, regular rhythm and normal heart sounds. Exam reveals no gallop.  No murmur heard. Pulmonary/Chest: Effort normal and breath sounds normal. No respiratory distress. She has no wheezes. She has no rales.  Abdominal: Soft. She exhibits no distension and no mass. There is no abdominal tenderness. There is no rebound and no guarding.  Musculoskeletal:        General: No edema. Normal range of motion.     Cervical back: Normal range of motion and neck supple.  Lymphadenopathy:       Head (right side): No preauricular, no posterior auricular and no occipital adenopathy present.       Head (left side): No preauricular, no posterior auricular and no occipital adenopathy present.    She has no cervical adenopathy.    She has no axillary adenopathy.       Right: No inguinal and no supraclavicular adenopathy present.       Left: No inguinal and no supraclavicular adenopathy present.  Neurological: She is alert and oriented to person, place, and time.  Skin: Skin is warm and dry. She is not diaphoretic. No pallor.  Psychiatric: She has a normal mood and affect. Her behavior is normal. Judgment and thought content normal.  Nursing note and vitals reviewed.   No visits with results within 3 Day(s) from this visit.  Latest known visit with results is:  Appointment on 05/01/2019  Component Date Value Ref Range Status  . WBC 05/01/2019 4.6  4.0 - 10.5 K/uL Final  . RBC 05/01/2019 4.67  3.87 - 5.11 MIL/uL Final  . Hemoglobin 05/01/2019 14.3  12.0 - 15.0 g/dL Final  . HCT 05/01/2019 46.1* 36.0 - 46.0 % Final  . MCV 05/01/2019 98.7  80.0 - 100.0 fL Final  . MCH 05/01/2019 30.6  26.0 - 34.0 pg Final  . MCHC 05/01/2019  31.0  30.0 - 36.0 g/dL Final  . RDW 05/01/2019 14.3  11.5 - 15.5 % Final  . Platelets 05/01/2019 154  150 - 400 K/uL Final  . nRBC 05/01/2019 0.0  0.0 - 0.2 % Final   Performed at [facility], [address], [place], [place] [address]    Assessment:  Jill Bond is a 69 y.o. female withstage IAright breast cancers/plumpectomy on  02/06/2019. Pathology([id number]) revealed a1.2 cm grade I invasive carcinoma of no special type. Low grade DCIS was present. Closest margin was 4 mm. One of threesentinellymph nodes were positive for micro metastatic carcinoma(> 0.2 to 2 mm). Margins are negative for atypia and malignancy.Tumor was ER positive (>90%) PR positive (51-90%) and HER-2/neu negative (1+).Pathologic stagewas pT1c pN70m.  Oncotype DX testing on 03/09/2019 revealed a recurrence score of 16 with a distant recurrence risk at 9 years of 15% (95% CI 10-19%) and no apparent benefit of chemotherapy.    Bilateralscreening mammogramon 12/16/2018 revealed a mass in the right breast.There were no suspicious findings in the left breast.Right diagnosticmammogramand ultrasoundon 01/17/2019 revealed a1 x 1.2 x 1.1 cm hypoechoicmass in the right breast at the 9:30 position, 7 cm from the nipple. There were indeterminate calcifications in the upper-outerquadrant of theright breast. Lymph nodes were unremarkable.  She received right breast radiation from 03/20/2019 - 05/15/2019.  She hassarcoidosiss/p utrasound guided supraclavicular lymph node biopsy on 03/11/2016. Pathologyrevealed non-necrotizing granulomatousinflammation with fibrosis. There was no evidence of malignancy. GMS-fungal, AFB and Gram stainswerenegative.She presented withsmall upper abdominal adenopathy,bibasilar lung nodes, a30 pound weight loss,some drenching night sweats, and mild hypercalcemia.   Abdominal and pelvic CTscan on 02/26/2016 revealed multiple new bibasilar lung  nodules measuring up to 5 mm in size. There was new para-aortic and gastrohepatic lymph nodes measuring up to 1 cm in size, suspicious for metastatic disease.  She has a history ofmultiple angiomyolipomass/p left nephrectomy on 03/15/2006. Baseline creatinineis 0.9 - 1.1 (CrCl 50-63 ml/min).  Bone density on 02/22/2019 was normal with a T-score of -0.5 at the right femur neck.   Colonoscopyin 2013 revealed polyps (next scheduled 2018). She had a partial hysterectomy at age 69  Symptomatically, she is doing well.  Right breast blistering s/p radiation is resolving.  Plan: 1.   Labs today CBC with diff, CMP. 2.   Stage IA right breast cancer She is s/p lumpectomy and SLN biopsy on 02/06/2019. Pathologic stage is T1cN111m(stage IA) OncoType DX testing revealed a recurrence score of 16.                         No benefit of chemotherapy.             Patient s/p right breast radiation (completed on 05/15/2019). Re-review plans for an aromatase inhibitor (Femara) adjuvantly for at least 5 years.   Potential side effects reviewed.   Information provided. 3.   RTC on 06/13/2019 for labs (CBC with diff, CMP, CA27.29). 4.   RN to call patient with results and initiation of Femara. 5.   RTC on 07/11/2019 for MD assessment including breast exam and labs (LFTs).  I discussed the assessment and treatment plan with the patient.  The patient was provided an opportunity to ask questions and all were answered.  The patient agreed with the plan and demonstrated an understanding of the instructions.  The patient was advised to call back if the symptoms worsen or if the condition fails to improve as anticipated.   MeLequita AsalMD, PhD    05/22/2019, 3:00 PM

## 2019-05-22 ENCOUNTER — Encounter: Payer: Self-pay | Admitting: Hematology and Oncology

## 2019-05-22 ENCOUNTER — Inpatient Hospital Stay: Payer: Medicare HMO | Attending: Hematology and Oncology | Admitting: Hematology and Oncology

## 2019-05-22 ENCOUNTER — Inpatient Hospital Stay: Payer: Medicare HMO

## 2019-05-22 ENCOUNTER — Other Ambulatory Visit: Payer: Self-pay

## 2019-05-22 VITALS — BP 138/83 | HR 88 | Temp 98.1°F | Resp 18 | Wt 234.5 lb

## 2019-05-22 DIAGNOSIS — Z79811 Long term (current) use of aromatase inhibitors: Secondary | ICD-10-CM | POA: Diagnosis not present

## 2019-05-22 DIAGNOSIS — R918 Other nonspecific abnormal finding of lung field: Secondary | ICD-10-CM | POA: Diagnosis not present

## 2019-05-22 DIAGNOSIS — C50411 Malignant neoplasm of upper-outer quadrant of right female breast: Secondary | ICD-10-CM | POA: Diagnosis not present

## 2019-05-22 DIAGNOSIS — Z923 Personal history of irradiation: Secondary | ICD-10-CM | POA: Diagnosis not present

## 2019-05-22 DIAGNOSIS — Z170 Estrogen receptor positive status [ER+]: Secondary | ICD-10-CM

## 2019-05-22 DIAGNOSIS — C50911 Malignant neoplasm of unspecified site of right female breast: Secondary | ICD-10-CM | POA: Insufficient documentation

## 2019-05-22 DIAGNOSIS — Z79899 Other long term (current) drug therapy: Secondary | ICD-10-CM | POA: Insufficient documentation

## 2019-05-22 LAB — COMPREHENSIVE METABOLIC PANEL
ALT: 35 U/L (ref 0–44)
AST: 32 U/L (ref 15–41)
Albumin: 3.7 g/dL (ref 3.5–5.0)
Alkaline Phosphatase: 135 U/L — ABNORMAL HIGH (ref 38–126)
Anion gap: 9 (ref 5–15)
BUN: 20 mg/dL (ref 8–23)
CO2: 27 mmol/L (ref 22–32)
Calcium: 9.4 mg/dL (ref 8.9–10.3)
Chloride: 102 mmol/L (ref 98–111)
Creatinine, Ser: 1.08 mg/dL — ABNORMAL HIGH (ref 0.44–1.00)
GFR calc Af Amer: >60 mL/min (ref >60)
GFR calc non Af Amer: 53 mL/min — ABNORMAL LOW (ref >60)
Glucose, Bld: 124 mg/dL — ABNORMAL HIGH (ref 70–99)
Potassium: 3.8 mmol/L (ref 3.5–5.1)
Sodium: 138 mmol/L (ref 135–145)
Total Bilirubin: 1 mg/dL (ref 0.3–1.2)
Total Protein: 7.2 g/dL (ref 6.5–8.1)

## 2019-05-22 LAB — CBC WITH DIFFERENTIAL/PLATELET
Abs Immature Granulocytes: 0.01 10*3/uL (ref 0.00–0.07)
Basophils Absolute: 0 10*3/uL (ref 0.0–0.1)
Basophils Relative: 1 %
Eosinophils Absolute: 0.3 10*3/uL (ref 0.0–0.5)
Eosinophils Relative: 7 %
HCT: 43.5 % (ref 36.0–46.0)
Hemoglobin: 14.3 g/dL (ref 12.0–15.0)
Immature Granulocytes: 0 %
Lymphocytes Relative: 11 %
Lymphs Abs: 0.4 10*3/uL — ABNORMAL LOW (ref 0.7–4.0)
MCH: 31.3 pg (ref 26.0–34.0)
MCHC: 32.9 g/dL (ref 30.0–36.0)
MCV: 95.2 fL (ref 80.0–100.0)
Monocytes Absolute: 0.7 10*3/uL (ref 0.1–1.0)
Monocytes Relative: 17 %
Neutro Abs: 2.7 10*3/uL (ref 1.7–7.7)
Neutrophils Relative %: 64 %
Platelets: 169 10*3/uL (ref 150–400)
RBC: 4.57 MIL/uL (ref 3.87–5.11)
RDW: 13.4 % (ref 11.5–15.5)
WBC: 4.2 10*3/uL (ref 4.0–10.5)
nRBC: 0 % (ref 0.0–0.2)

## 2019-05-22 NOTE — Patient Instructions (Signed)
Letrozole tablets What is this medicine? LETROZOLE (LET roe zole) blocks the production of estrogen. It is used to treat breast cancer. This medicine may be used for other purposes; ask your health care provider or pharmacist if you have questions. COMMON BRAND NAME(S): Femara What should I tell my health care provider before I take this medicine? They need to know if you have any of these conditions:  high cholesterol  liver disease  osteoporosis (weak bones)  an unusual or allergic reaction to letrozole, other medicines, foods, dyes, or preservatives  pregnant or trying to get pregnant  breast-feeding How should I use this medicine? Take this medicine by mouth with a glass of water. You may take it with or without food. Follow the directions on the prescription label. Take your medicine at regular intervals. Do not take your medicine more often than directed. Do not stop taking except on your doctor's advice. Talk to your pediatrician regarding the use of this medicine in children. Special care may be needed. Overdosage: If you think you have taken too much of this medicine contact a poison control center or emergency room at once. NOTE: This medicine is only for you. Do not share this medicine with others. What if I miss a dose? If you miss a dose, take it as soon as you can. If it is almost time for your next dose, take only that dose. Do not take double or extra doses. What may interact with this medicine? Do not take this medicine with any of the following medications:  estrogens, like hormone replacement therapy or birth control pills This medicine may also interact with the following medications:  dietary supplements such as androstenedione or DHEA  prasterone  tamoxifen This list may not describe all possible interactions. Give your health care provider a list of all the medicines, herbs, non-prescription drugs, or dietary supplements you use. Also tell them if you  smoke, drink alcohol, or use illegal drugs. Some items may interact with your medicine. What should I watch for while using this medicine? Tell your doctor or healthcare professional if your symptoms do not start to get better or if they get worse. Do not become pregnant while taking this medicine or for 3 weeks after stopping it. Women should inform their doctor if they wish to become pregnant or think they might be pregnant. There is a potential for serious side effects to an unborn child. Talk to your health care professional or pharmacist for more information. Do not breast-feed while taking this medicine or for 3 weeks after stopping it. This medicine may interfere with the ability to have a child. Talk with your doctor or health care professional if you are concerned about your fertility. Using this medicine for a long time may increase your risk of low bone mass. Talk to your doctor about bone health. You may get drowsy or dizzy. Do not drive, use machinery, or do anything that needs mental alertness until you know how this medicine affects you. Do not stand or sit up quickly, especially if you are an older patient. This reduces the risk of dizzy or fainting spells. You may need blood work done while you are taking this medicine. What side effects may I notice from receiving this medicine? Side effects that you should report to your doctor or health care professional as soon as possible:  allergic reactions like skin rash, itching, or hives  bone fracture  chest pain  signs and symptoms of a blood clot  such as breathing problems; changes in vision; chest pain; severe, sudden headache; pain, swelling, warmth in the leg; trouble speaking; sudden numbness or weakness of the face, arm or leg  vaginal bleeding Side effects that usually do not require medical attention (report to your doctor or health care professional if they continue or are bothersome):  bone, back, joint, or muscle  pain  dizziness  fatigue  fluid retention  headache  hot flashes, night sweats  nausea  weight gain This list may not describe all possible side effects. Call your doctor for medical advice about side effects. You may report side effects to FDA at 1-800-FDA-1088. Where should I keep my medicine? Keep out of the reach of children. Store between 15 and 30 degrees C (59 and 86 degrees F). Throw away any unused medicine after the expiration date. NOTE: This sheet is a summary. It may not cover all possible information. If you have questions about this medicine, talk to your doctor, pharmacist, or health care provider.  2020 Elsevier/Gold Standard (2015-12-02 11:10:41)  

## 2019-05-22 NOTE — Progress Notes (Signed)
Patient here for follow up. Currently has blistering and redness s/p radiation x 3-4 weeks. Reports she was taking methotrexate when she started radiation and this is what caused it. Denies any other concerns.

## 2019-06-06 ENCOUNTER — Telehealth: Payer: Self-pay | Admitting: Internal Medicine

## 2019-06-06 DIAGNOSIS — D860 Sarcoidosis of lung: Secondary | ICD-10-CM | POA: Diagnosis not present

## 2019-06-06 DIAGNOSIS — G4733 Obstructive sleep apnea (adult) (pediatric): Secondary | ICD-10-CM | POA: Diagnosis not present

## 2019-06-06 DIAGNOSIS — Z9989 Dependence on other enabling machines and devices: Secondary | ICD-10-CM | POA: Diagnosis not present

## 2019-06-06 NOTE — Telephone Encounter (Signed)
Per Dimas Chyle at [employer] she will forward message to [employer]'s Billing Department.   Called and spoke with pt's daughter, Tonette Bihari to advise and she stated that she originally contacted [employer]'s billing department and was advised that this issue needed to go through her primary care or pulmonary physician. Daughter also stated that this issue has been going on since July 2020.  Message sent back to Dimas Chyle and Darlina Guys with [employer] of the above. Advised pt's daughter to call us back if this issue is not corrected. Rhonda J [place]

## 2019-06-06 NOTE — Telephone Encounter (Signed)
Jill Bond, can you help with this. Thanks 

## 2019-06-06 NOTE — Telephone Encounter (Signed)
CM sent to Darlina Guys and Dimas Chyle with [employer] regarding this issue.  We do not handle PA's for DME Equipment. Waiting on response from [employer]. Rhonda J [place]

## 2019-06-12 DIAGNOSIS — N1832 Chronic kidney disease, stage 3b: Secondary | ICD-10-CM | POA: Diagnosis not present

## 2019-06-12 DIAGNOSIS — I129 Hypertensive chronic kidney disease with stage 1 through stage 4 chronic kidney disease, or unspecified chronic kidney disease: Secondary | ICD-10-CM | POA: Diagnosis not present

## 2019-06-12 DIAGNOSIS — D869 Sarcoidosis, unspecified: Secondary | ICD-10-CM | POA: Diagnosis not present

## 2019-06-12 DIAGNOSIS — Z6841 Body Mass Index (BMI) 40.0 and over, adult: Secondary | ICD-10-CM | POA: Diagnosis not present

## 2019-06-12 DIAGNOSIS — Z0000 Encounter for general adult medical examination without abnormal findings: Secondary | ICD-10-CM | POA: Diagnosis not present

## 2019-06-12 DIAGNOSIS — Z853 Personal history of malignant neoplasm of breast: Secondary | ICD-10-CM | POA: Diagnosis not present

## 2019-06-12 DIAGNOSIS — Z0001 Encounter for general adult medical examination with abnormal findings: Secondary | ICD-10-CM | POA: Diagnosis not present

## 2019-06-12 DIAGNOSIS — Z85528 Personal history of other malignant neoplasm of kidney: Secondary | ICD-10-CM | POA: Diagnosis not present

## 2019-06-13 ENCOUNTER — Inpatient Hospital Stay: Payer: Medicare HMO | Attending: Hematology and Oncology

## 2019-06-13 ENCOUNTER — Other Ambulatory Visit: Payer: Self-pay

## 2019-06-13 DIAGNOSIS — Z853 Personal history of malignant neoplasm of breast: Secondary | ICD-10-CM | POA: Insufficient documentation

## 2019-06-13 DIAGNOSIS — C50411 Malignant neoplasm of upper-outer quadrant of right female breast: Secondary | ICD-10-CM

## 2019-06-13 LAB — CBC WITH DIFFERENTIAL/PLATELET
Abs Immature Granulocytes: 0.01 10*3/uL (ref 0.00–0.07)
Basophils Absolute: 0 10*3/uL (ref 0.0–0.1)
Basophils Relative: 1 %
Eosinophils Absolute: 0.3 10*3/uL (ref 0.0–0.5)
Eosinophils Relative: 8 %
HCT: 41.8 % (ref 36.0–46.0)
Hemoglobin: 13.6 g/dL (ref 12.0–15.0)
Immature Granulocytes: 0 %
Lymphocytes Relative: 14 %
Lymphs Abs: 0.5 10*3/uL — ABNORMAL LOW (ref 0.7–4.0)
MCH: 30.4 pg (ref 26.0–34.0)
MCHC: 32.5 g/dL (ref 30.0–36.0)
MCV: 93.3 fL (ref 80.0–100.0)
Monocytes Absolute: 0.7 10*3/uL (ref 0.1–1.0)
Monocytes Relative: 18 %
Neutro Abs: 2.3 10*3/uL (ref 1.7–7.7)
Neutrophils Relative %: 59 %
Platelets: 149 10*3/uL — ABNORMAL LOW (ref 150–400)
RBC: 4.48 MIL/uL (ref 3.87–5.11)
RDW: 13.2 % (ref 11.5–15.5)
WBC: 3.8 10*3/uL — ABNORMAL LOW (ref 4.0–10.5)
nRBC: 0 % (ref 0.0–0.2)

## 2019-06-13 LAB — COMPREHENSIVE METABOLIC PANEL
ALT: 50 U/L — ABNORMAL HIGH (ref 0–44)
AST: 51 U/L — ABNORMAL HIGH (ref 15–41)
Albumin: 3.7 g/dL (ref 3.5–5.0)
Alkaline Phosphatase: 136 U/L — ABNORMAL HIGH (ref 38–126)
Anion gap: 11 (ref 5–15)
BUN: 19 mg/dL (ref 8–23)
CO2: 28 mmol/L (ref 22–32)
Calcium: 9.7 mg/dL (ref 8.9–10.3)
Chloride: 101 mmol/L (ref 98–111)
Creatinine, Ser: 1.18 mg/dL — ABNORMAL HIGH (ref 0.44–1.00)
GFR calc Af Amer: 55 mL/min — ABNORMAL LOW (ref >60)
GFR calc non Af Amer: 48 mL/min — ABNORMAL LOW (ref >60)
Glucose, Bld: 159 mg/dL — ABNORMAL HIGH (ref 70–99)
Potassium: 3.7 mmol/L (ref 3.5–5.1)
Sodium: 140 mmol/L (ref 135–145)
Total Bilirubin: 0.7 mg/dL (ref 0.3–1.2)
Total Protein: 7 g/dL (ref 6.5–8.1)

## 2019-06-14 ENCOUNTER — Telehealth: Payer: Self-pay

## 2019-06-14 DIAGNOSIS — G4733 Obstructive sleep apnea (adult) (pediatric): Secondary | ICD-10-CM | POA: Diagnosis not present

## 2019-06-14 DIAGNOSIS — D696 Thrombocytopenia, unspecified: Secondary | ICD-10-CM

## 2019-06-14 DIAGNOSIS — R7989 Other specified abnormal findings of blood chemistry: Secondary | ICD-10-CM

## 2019-06-14 LAB — CANCER ANTIGEN 27.29: CA 27.29: 25.8 U/mL (ref 0.0–38.6)

## 2019-06-14 NOTE — Telephone Encounter (Signed)
-----   Message from Lequita Asal, MD sent at 06/14/2019 12:47 PM EST ----- Regarding: Please call patient  Liver function tests are slightly elevated.  Platelets are a little low.  Any new medication or herbal product?  Let's check labs next week (CBC with diff, LFTs, hepatitis B core antibody total, hepatitis B surface antigen, and hepatitis C antibody) with f/u visit 2 days later (can be Doximity).  M ----- Message ----- From: Buel Ream, Lab [facility] [place] Sent: 06/13/2019   9:47 AM EST To: Lequita Asal, MD

## 2019-06-14 NOTE — Telephone Encounter (Signed)
Informed patient of LFT and Platelet results. Patient denies starting any new medications or herbal products. Informed patient Dr. Mike Gip would like for her to come in next week for labs and then Doximity 2 days later. Patient agrees and was transferred to [facility] to be scheduled.

## 2019-06-15 ENCOUNTER — Encounter: Payer: Self-pay | Admitting: Radiation Oncology

## 2019-06-15 ENCOUNTER — Other Ambulatory Visit: Payer: Self-pay

## 2019-06-15 NOTE — Progress Notes (Signed)
Patient pre screened for office appointment, no questions or concerns today. Patient reminded of upcoming appointment time and date. Daughter supplied information for pre-screening. 

## 2019-06-16 ENCOUNTER — Ambulatory Visit
Admission: RE | Admit: 2019-06-16 | Discharge: 2019-06-16 | Disposition: A | Discharge status: Home / Self Care | Payer: Medicare HMO | Source: Ambulatory Visit | Attending: Radiation Oncology | Admitting: Radiation Oncology

## 2019-06-16 ENCOUNTER — Other Ambulatory Visit: Payer: Self-pay

## 2019-06-16 VITALS — BP 140/85 | HR 99 | Temp 97.5°F | Resp 20 | Wt 234.0 lb

## 2019-06-16 DIAGNOSIS — C50411 Malignant neoplasm of upper-outer quadrant of right female breast: Secondary | ICD-10-CM | POA: Diagnosis not present

## 2019-06-16 DIAGNOSIS — Z923 Personal history of irradiation: Secondary | ICD-10-CM | POA: Insufficient documentation

## 2019-06-16 DIAGNOSIS — Z170 Estrogen receptor positive status [ER+]: Secondary | ICD-10-CM | POA: Diagnosis not present

## 2019-06-16 NOTE — Progress Notes (Signed)
Radiation Oncology Follow up Note  Name: Jill Bond   Date:   06/16/2019 MRN:  [id number] DOB: 1950/12/13    This 69 y.o. female presents to the clinic today for 1 month follow-up status post whole breast and peripheral lymphatic radiation radiation.  For stage T1CN1 MI M0 invasive mammary carcinoma ER/PR positive  REFERRING PROVIDER: Baxter Hire, MD  HPI: Patient is a 69 year old female now out 1 month having completed whole breast and peripheral emphatic radiation.  To her right breast for stage T1N1 MI M0 ER/PR positive HER-2 negative invasive mammary carcinoma seen today in routine follow-up she is doing well.  Breast is completely healed at this point.  She is waiting for Dr. Mike Gip to put her on antiestrogen therapy.  She specifically [place] breast tenderness cough or bone pain.  COMPLICATIONS OF TREATMENT: none  FOLLOW UP COMPLIANCE: keeps appointments   PHYSICAL EXAM:  BP 140/85 (BP Location: Left Wrist, Patient Position: Sitting, Cuff Size: Normal)   Pulse 99   Temp (!) 97.5 F (36.4 C) (Tympanic)   Resp 20   Wt 234 lb (106.1 kg)   SpO2 97%   BMI 40.17 kg/m  Lungs are clear to A&P cardiac examination essentially unremarkable with regular rate and rhythm. No dominant mass or nodularity is noted in either breast in 2 positions examined. Incision is well-healed. No axillary or supraclavicular adenopathy is appreciated. Cosmetic result is excellent.  Well-developed well-nourished patient in NAD. HEENT reveals PERLA, EOMI, discs not visualized.  Oral cavity is clear. No oral mucosal lesions are identified. Neck is clear without evidence of cervical or supraclavicular adenopathy. Lungs are clear to A&P. Cardiac examination is essentially unremarkable with regular rate and rhythm without murmur rub or thrill. Abdomen is benign with no organomegaly or masses noted. Motor sensory and DTR levels are equal and symmetric in the upper and lower extremities. Cranial nerves II through  XII are grossly intact. Proprioception is intact. No peripheral adenopathy or edema is identified. No motor or sensory levels are noted. Crude visual fields are within normal range.  RADIOLOGY RESULTS: No current films to review  PLAN: Present time patient is doing well her skin reactions have completely resolved at this point.  And pleased with her overall progress.  She is a virtual visit with Dr. Mike Gip next week at which time of asked her to discuss antiestrogen therapy.  I have asked to see her back in 4 to 5 months for follow-up.  Patient knows to call with any concerns.  I would like to take this opportunity to thank you for allowing me to participate in the care of your patient.Noreene Filbert, MD

## 2019-06-19 NOTE — Progress Notes (Signed)
[facility]  [address], [address] [place],  [address] Phone: [phone]  Fax: [phone]   Telemedicine Office Visit:  06/22/2019  Referring physician: Baxter Hire, MD  I connected with Jill Bond on 06/22/2019 at 8:54 AM by videoconferencing and verified that I was speaking with the correct person using 2 identifiers.  The patient was at home.  I discussed the limitations, risk, security and privacy concerns of performing an evaluation and management service by videoconferencing and the availability of in person appointments.  I also discussed with the patient that there may be a patient responsible charge related to this service.  The patient expressed understanding and agreed to proceed.   Chief Complaint: Jill Bond is a 69 y.o. female with sarcoid andstage IAright breastcancer who is seen for 1 month assessment.   HPI: The patient was last seen in the medical oncology clinic on 05/22/2019. At that time, she was doing well. Right breast blistering s/p radiation was resolving. Hematocrit 43.5, hemoglobin 14.3, platelets 169,000, WBC 4,200 (ALC 400). Creatinine 1.08. Alkaline phosphatase 135. We planned for an aromatase inhibitor (Femara) adjuvantly for a least 5 years.   She had an initial consult with Dr. Ottie Bond, pulmonologist, on 06/06/2019. She denied any cough or worsening symptoms. She noted weight gain. She continued on methotrexate 10 mg weekly. She remained on CPAP.   She had 1 month follow up with Dr. Baruch Bond on 06/16/2019. She was doing well. Her breast were completely healed. She denied any breast tenderness, cough or bone pain. She will follow up in 4-5 months.   Labs followed: 06/13/2019: Hematocrit 41.8, hemoglobin 13.6, platelets 149,000, WBC 3,800. AST 51, ALT 50, alkaline phosphatase 136. CA 27.29 was 25.8. 06/20/2019: Hematocrit 43.0, hemoglobin 14.1, platelets 158,000, WBC 3,900. AST 53, ALT 55, alkaline  phosphatase 132, total bilirubin 1.4 (direc 0.3).  Hepatitis C antibody, hepatitis B surface antigen, and hepatitis B core total antibody were negative.   During the interim, she has felt good. She notes that Dr. Baruch Bond took her off methotrexate. Her methotrexate was being controlled by Dr. Ashby Bond. She notes that the new pulmonologist is Dr. Lanney Bond.   Patient agreed to abdomen and pelvis CT.  We discussed initiating Femara.    Past Medical History:  Diagnosis Date   Anxiety    Arthritis    Breast cancer ([place]) 01/2019   invasive mammary carcinoma    Chronic kidney disease 2006   Left Nephrectomy   Complication of anesthesia    PONV   Constipation    Depression    GERD (gastroesophageal reflux disease)    Headache    Hypertension    PONV (postoperative nausea and vomiting)    Rotator cuff disorder    LEFT   Sarcoidosis    Shortness of breath dyspnea    with exertion   Sleep apnea     Past Surgical History:  Procedure Laterality Date   ABDOMINAL HYSTERECTOMY     PARTIAL   ACHILLES TENDON SURGERY Left 02/22/2015   Procedure: Secondary ACHILLES TENDON REPAIR;  Surgeon: Albertine Patricia, DPM;  Location: [facility] ORS;  Service: Podiatry;  Laterality: Left;   Bladder tac     BREAST BIOPSY Right 01/23/2019   affirm stereo bx of calcs with x marker, ductal hyperplasia   BREAST BIOPSY Right 01/23/2019   [place] bx of mass with coil marker, invasive mammary carcinoma   BREAST EXCISIONAL BIOPSY Left 2003   neg   BREAST LUMPECTOMY Right 02/06/2019  COLONOSCOPY  2013   JOINT REPLACEMENT Left 2007   Total Knee Replacement   KIDNEY CYST REMOVAL Left    OSTECTOMY Left 02/22/2015   Procedure: OSTECTOMY;  Surgeon: Albertine Patricia, DPM;  Location: [facility] ORS;  Service: Podiatry;  Laterality: Left;   PARTIAL MASTECTOMY WITH NEEDLE LOCALIZATION AND AXILLARY SENTINEL LYMPH NODE BX Right 02/06/2019   Procedure: PARTIAL MASTECTOMY WITH NEEDLE LOCALIZATION AND  AXILLARY SENTINEL LYMPH NODE BX;  Surgeon: Herbert Pun, MD;  Location: [facility] ORS;  Service: General;  Laterality: Right;   SHOULDER ARTHROSCOPY W/ ROTATOR CUFF REPAIR Left YRS AGO   TONSILLECTOMY  AGE 89   TOTAL KNEE ARTHROPLASTY Right 10/22/2015   Procedure: RIGHT TOTAL KNEE ARTHROPLASTY;  Surgeon: Paralee Cancel, MD;  Location: [facility];  Service: Orthopedics;  Laterality: Right;   TOTAL KNEE ARTHROPLASTY Right    10/22/2015    Family History  Problem Relation Age of Onset   Breast cancer Mother 56   Heart failure Father    Prostate cancer Father     Social History:  reports that she has never smoked. She has never used smokeless tobacco. She reports that she does not drink alcohol or use drugs. She denies any exposure to radiation or toxins. 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 Her husband died on 2013/09/14.Her first cousinis Jill Bond([phone]).Her daughter, Jill Bond,is her medical power of attorney. The patient is accompanied by Jill Bond today.  Participants in the patient's visit and their role in the encounter included the patient, Jill Bond, and Jill Bond, Therapist, sports, today.  The intake visit was provided by Jill Budge, RN.  Allergies:  Allergies  Allergen Reactions   Codeine Nausea And Vomiting   Penicillins Hives and Other (See Comments)    Has patient had a PCN reaction causing immediate rash, facial/tongue/throat swelling, SOB or lightheadedness with hypotension: no Has patient had a PCN reaction causing severe rash involving mucus membranes or skin necrosis: no Has patient had a PCN reaction that required hospitalization no Has patient had a PCN reaction occurring within the last 10 years: no If all of the above answers are "NO", then may proceed with Cephalosporin use.     Current Medications: Current Outpatient Medications  Medication Sig Dispense Refill   amLODipine (NORVASC) 10 MG tablet Take 10 mg by mouth at bedtime.      cyclobenzaprine (FLEXERIL) 10 MG tablet Take 1 tablet by mouth as needed.     diclofenac sodium (VOLTAREN) 1 % GEL Apply 1 application topically 2 (two) times daily as needed (foot pain).     docusate sodium (COLACE) 100 MG capsule Take 250 mg by mouth at bedtime.      metoprolol succinate (TOPROL-XL) 100 MG 24 hr tablet Take 100 mg by mouth at bedtime.      omeprazole (PRILOSEC) 20 MG capsule Take 20 mg by mouth daily.      methotrexate (RHEUMATREX) 2.5 MG tablet TAKE 4 TABLETS (10 MG TOTAL) BY MOUTH ONCE A WEEK. CAUTION:CHEMOTHERAPY. PROTECT FROM LIGHT. (Patient not taking: Reported on 06/21/2019) 52 tablet 2   Multiple Vitamin (MULTIVITAMIN WITH MINERALS) TABS tablet Take 0.5 tablets by mouth daily.      No current facility-administered medications for this visit.    Review of Systems  Constitutional: Negative for chills, diaphoresis, fever, malaise/fatigue and weight loss.       Feels good.  HENT: Negative for congestion, ear pain, hearing loss, nosebleeds, sinus pain and sore throat.        Chronic  rhinorrhea.  Eyes: Negative for blurred vision and double vision.       Floaters.  Respiratory: Negative for hemoptysis, sputum production, shortness of breath and wheezing.        Sleep apnea on CPAP at night.  Plans to see a new pulmonologist.  Cardiovascular: Negative.  Negative for chest pain, palpitations, orthopnea, claudication and leg swelling.  Gastrointestinal: Negative.  Negative for abdominal pain, blood in stool, constipation, diarrhea, melena, nausea and vomiting.  Genitourinary: Negative.  Negative for dysuria, frequency, hematuria and urgency.  Musculoskeletal: Negative for back pain (chronic), falls, joint pain (right knee bothered by the cold), myalgias and neck pain.  Skin: Negative for itching and rash.       Right breast blistering s/p radiation has healed.  Neurological: Negative.  Negative for dizziness, tingling, sensory change, speech change, focal weakness,  weakness and headaches.  Endo/Heme/Allergies: Negative.  Does not bruise/bleed easily.  Psychiatric/Behavioral: Positive for memory loss. Negative for depression. The patient is not nervous/anxious and does not have insomnia.   All other systems reviewed and are negative.  Performance status (ECOG):  1  Physical Exam  Constitutional: She is oriented to person, place, and time. She appears well-developed and well-nourished. No distress.  HENT:  Head: Normocephalic and atraumatic.  Long brown hair.  Eyes: Conjunctivae and EOM are normal. No scleral icterus.  Glasses.  Blue eyes.  Neurological: She is alert and oriented to person, place, and time.  Skin: She is not diaphoretic.  Psychiatric: She has a normal mood and affect. Her behavior is normal. Judgment and thought content normal.  Nursing note reviewed.   Appointment on 06/20/2019  Component Date Value Ref Range Status   HCV Ab 06/20/2019 NON REACTIVE  NON REACTIVE Final   Comment: (NOTE) Nonreactive HCV antibody screen is consistent with no HCV infections,  unless recent infection is suspected or other evidence exists to indicate HCV infection. Performed at [facility], [place] [address], [place], [place] [address]    Hepatitis B Surface Ag 06/20/2019 NON REACTIVE  NON REACTIVE Final   Performed at [facility], [place] [address] [place], [place] [address]   Hep B Core Total Ab 06/20/2019 NON REACTIVE  NON REACTIVE Final   Performed at [facility], [place] [address] [place], [place] [address]   Total Protein 06/20/2019 7.3  6.5 - 8.1 g/dL Final   Albumin 06/20/2019 3.9  3.5 - 5.0 g/dL Final   AST 06/20/2019 53* 15 - 41 U/L Final   ALT 06/20/2019 55* 0 - 44 U/L Final   Alkaline Phosphatase 06/20/2019 132* 38 - 126 U/L Final   Total Bilirubin 06/20/2019 1.4* 0.3 - 1.2 mg/dL Final   Bilirubin, Direct 06/20/2019 0.3* 0.0 - 0.2 mg/dL Final   Indirect Bilirubin 06/20/2019 1.1* 0.3 - 0.9 mg/dL Final    Performed at [facility], [address], [place], [place] [address]   WBC 06/20/2019 3.9* 4.0 - 10.5 K/uL Final   RBC 06/20/2019 4.65  3.87 - 5.11 MIL/uL Final   Hemoglobin 06/20/2019 14.1  12.0 - 15.0 g/dL Final   HCT 06/20/2019 43.0  36.0 - 46.0 % Final   MCV 06/20/2019 92.5  80.0 - 100.0 fL Final   MCH 06/20/2019 30.3  26.0 - 34.0 pg Final   MCHC 06/20/2019 32.8  30.0 - 36.0 g/dL Final   RDW 06/20/2019 13.2  11.5 - 15.5 % Final   Platelets 06/20/2019 158  150 - 400 K/uL Final  nRBC 06/20/2019 0.0  0.0 - 0.2 % Final   Neutrophils Relative % 06/20/2019 62  % Final   Neutro Abs 06/20/2019 2.4  1.7 - 7.7 K/uL Final   Lymphocytes Relative 06/20/2019 13  % Final   Lymphs Abs 06/20/2019 0.5* 0.7 - 4.0 K/uL Final   Monocytes Relative 06/20/2019 17  % Final   Monocytes Absolute 06/20/2019 0.7  0.1 - 1.0 K/uL Final   Eosinophils Relative 06/20/2019 7  % Final   Eosinophils Absolute 06/20/2019 0.3  0.0 - 0.5 K/uL Final   Basophils Relative 06/20/2019 1  % Final   Basophils Absolute 06/20/2019 0.0  0.0 - 0.1 K/uL Final   Immature Granulocytes 06/20/2019 0  % Final   Abs Immature Granulocytes 06/20/2019 0.01  0.00 - 0.07 K/uL Final   Performed at [facility], [address] [place], [place] [address]    Assessment:  MIZANI DILDAY is a 69 y.o. female withstage IAright breast cancers/plumpectomy on 02/06/2019. Pathology([id number]) revealed a1.2 cm grade I invasive carcinoma of no special type. Low grade DCIS was present. Closest margin was 4 mm. One of threesentinellymph nodes were positive for micro metastatic carcinoma(>0.2 to 2 mm). Margins are negative for atypia and malignancy.Tumor was ER positive (>90%) PR positive (51-90%) and HER-2/neu negative (1+).Pathologic stagewas pT1c pN101m.  Oncotype DX testingon 03/09/2019 revealed a recurrence score of 16 with a distant recurrence risk at 9 years of 15% (95% CI 10-19%)  and no apparent benefit of chemotherapy.   Bilateralscreening mammogramon 12/16/2018 revealed a mass in the right breast.There were no suspicious findings in the left breast.Right diagnosticmammogramand ultrasoundon 01/17/2019 revealed a1 x 1.2 x 1.1 cm hypoechoicmass in the right breast at the 9:30 position, 7 cm from the nipple. There were indeterminate calcifications in the upper-outerquadrant of theright breast. Lymph nodes were unremarkable.  She received right breast radiation from 03/20/2019 - 05/15/2019.  She hassarcoidosiss/p utrasound guided supraclavicular lymph node biopsy on 03/11/2016. Pathologyrevealed non-necrotizing granulomatousinflammation with fibrosis. There was no evidence of malignancy. GMS-fungal, AFB and Gram stainswerenegative.She presented withsmall upper abdominal adenopathy,bibasilar lung nodes, a30 pound weight loss,some drenching night sweats, and mild hypercalcemia.   Abdominal and pelvic CTscan on 02/26/2016 revealed multiple new bibasilar lung nodules measuring up to 5 mm in size. There was new para-aortic and gastrohepatic lymph nodes measuring up to 1 cm in size, suspicious for metastatic disease.  She has elevated liver function tests.  AST was 53 and ALT 55 on 06/20/2019.  Hepatitis B and C serologies were negative on 06/20/2019.  She has a history ofmultiple angiomyolipomass/p left nephrectomy on 03/15/2006. Baseline creatinineis 0.9 - 1.1 (CrCl 50-63 ml/min).  Bone densityon 02/22/2019 was normal with a T-score of -0.5 at the right femur neck.   Colonoscopyin 2013 revealed polyps (next scheduled 2018). She had a partial hysterectomy at age [age]  Symptomatically, she is doing well.  Right breast changes s/p radiation have healed.  Plan: 1.   Review labs from 06/20/2019. 2.   Stage IA right breast cancer She is s/plumpectomyand SLN biopsy on 02/06/2019. Pathologic stage was [id number] (stage IA) OncoType DX testingrevealed a recurrence score of 16. No benefit of chemotherapy. Patient s/p right breast radiation (completed on 05/15/2019). Review plans for letrozole (Femara) adjuvantly for at least 5 years.                         Potential side effects reviewed.  Information provided. 3.   Elevated LFTs  Patient has midly elevated LFTs.  Hepatitis B and C serologies were negative.  Discuss consideration of an abdomen and pelvis CT.   Patient in agreement.  Avoid contrast secondary to one kidney. 4.   Schedule abdomen and pelvic CT on 06/23/2019. 5.   RTC after CT scan (Doximity) for MD assessment and review of imaging.  I discussed the assessment and treatment plan with the patient.  The patient was provided an opportunity to ask questions and all were answered.  The patient agreed with the plan and demonstrated an understanding of the instructions.  The patient was advised to call back if the symptoms worsen or if the condition fails to improve as anticipated.  I provided 16 minutes (8:54 AM - 9:10 AM) of face-to-face video visit time during this this encounter and > 50% was spent counseling as documented under my assessment and plan.  I provided these services from the [facility] office.  An additional 10 minutes were spent reviewing herchart (Epic and Care Everywhere) including notes, labs, and imaging studies.    Lequita Asal, MD, PhD    06/22/2019, 8:54 AM  I, Selena Batten, am acting as scribe for [employer]. Mike Gip, MD, PhD.  I, Anija Brickner C. Mike Gip, MD, have reviewed the above documentation for accuracy and completeness, and I agree with the above.

## 2019-06-20 ENCOUNTER — Inpatient Hospital Stay: Payer: Medicare HMO

## 2019-06-20 ENCOUNTER — Other Ambulatory Visit: Payer: Self-pay

## 2019-06-20 DIAGNOSIS — Z853 Personal history of malignant neoplasm of breast: Secondary | ICD-10-CM | POA: Diagnosis not present

## 2019-06-20 DIAGNOSIS — R7989 Other specified abnormal findings of blood chemistry: Secondary | ICD-10-CM

## 2019-06-20 DIAGNOSIS — D696 Thrombocytopenia, unspecified: Secondary | ICD-10-CM

## 2019-06-20 LAB — HEPATIC FUNCTION PANEL
ALT: 55 U/L — ABNORMAL HIGH (ref 0–44)
AST: 53 U/L — ABNORMAL HIGH (ref 15–41)
Albumin: 3.9 g/dL (ref 3.5–5.0)
Alkaline Phosphatase: 132 U/L — ABNORMAL HIGH (ref 38–126)
Bilirubin, Direct: 0.3 mg/dL — ABNORMAL HIGH (ref 0.0–0.2)
Indirect Bilirubin: 1.1 mg/dL — ABNORMAL HIGH (ref 0.3–0.9)
Total Bilirubin: 1.4 mg/dL — ABNORMAL HIGH (ref 0.3–1.2)
Total Protein: 7.3 g/dL (ref 6.5–8.1)

## 2019-06-20 LAB — CBC WITH DIFFERENTIAL/PLATELET
Abs Immature Granulocytes: 0.01 10*3/uL (ref 0.00–0.07)
Basophils Absolute: 0 10*3/uL (ref 0.0–0.1)
Basophils Relative: 1 %
Eosinophils Absolute: 0.3 10*3/uL (ref 0.0–0.5)
Eosinophils Relative: 7 %
HCT: 43 % (ref 36.0–46.0)
Hemoglobin: 14.1 g/dL (ref 12.0–15.0)
Immature Granulocytes: 0 %
Lymphocytes Relative: 13 %
Lymphs Abs: 0.5 10*3/uL — ABNORMAL LOW (ref 0.7–4.0)
MCH: 30.3 pg (ref 26.0–34.0)
MCHC: 32.8 g/dL (ref 30.0–36.0)
MCV: 92.5 fL (ref 80.0–100.0)
Monocytes Absolute: 0.7 10*3/uL (ref 0.1–1.0)
Monocytes Relative: 17 %
Neutro Abs: 2.4 10*3/uL (ref 1.7–7.7)
Neutrophils Relative %: 62 %
Platelets: 158 10*3/uL (ref 150–400)
RBC: 4.65 MIL/uL (ref 3.87–5.11)
RDW: 13.2 % (ref 11.5–15.5)
WBC: 3.9 10*3/uL — ABNORMAL LOW (ref 4.0–10.5)
nRBC: 0 % (ref 0.0–0.2)

## 2019-06-20 LAB — HEPATITIS B CORE ANTIBODY, TOTAL: Hep B Core Total Ab: NONREACTIVE

## 2019-06-20 LAB — HEPATITIS B SURFACE ANTIGEN: Hepatitis B Surface Ag: NONREACTIVE

## 2019-06-20 LAB — HEPATITIS C ANTIBODY: HCV Ab: NONREACTIVE

## 2019-06-21 NOTE — Progress Notes (Signed)
Confirmed Name and DOB. Patient has questions regarding Methotrexate and Multivitamin. Denies any other concerns.

## 2019-06-22 ENCOUNTER — Inpatient Hospital Stay (HOSPITAL_BASED_OUTPATIENT_CLINIC_OR_DEPARTMENT_OTHER): Payer: Medicare HMO | Admitting: Hematology and Oncology

## 2019-06-22 ENCOUNTER — Telehealth: Payer: Medicare HMO | Admitting: Hematology and Oncology

## 2019-06-22 ENCOUNTER — Encounter: Payer: Self-pay | Admitting: Hematology and Oncology

## 2019-06-22 DIAGNOSIS — C50411 Malignant neoplasm of upper-outer quadrant of right female breast: Secondary | ICD-10-CM

## 2019-06-22 DIAGNOSIS — R7989 Other specified abnormal findings of blood chemistry: Secondary | ICD-10-CM | POA: Diagnosis not present

## 2019-06-22 DIAGNOSIS — Z170 Estrogen receptor positive status [ER+]: Secondary | ICD-10-CM | POA: Diagnosis not present

## 2019-06-25 NOTE — Progress Notes (Signed)
[facility]  [address] [place], [place] [address] Phone: [phone]  Fax: [phone]   Telephone Office Visit:  06/25/2019  Referring physician: Baxter Hire, MD  I connected with Jill Bond on 06/22/2019 at 7:47 PM by telephone and verified that I was speaking with the correct person using 2 identifiers.  The patient was at home.  I discussed the limitations, risk, security and privacy concerns of performing an evaluation and management service by telephone and the availability of in person appointments.  I also discussed with the patient that there may be a patient responsible charge related to this service.  The patient expressed understanding and agreed to proceed.   Chief Complaint: Jill Bond is a 69 y.o. female with sarcoid andstage IAright breastcancer who is seen for review of interval CT scan.  HPI: The patient was last seen in the medical oncology clinic on 06/22/2019. At that time, he was doing well.  Right breast changes status post radiation had healed.  We discussed plans for an aromatase inhibitor (Femara) adjuvantly for a least 5 years. Liver functions tests were elevated.  Hepatitis serologies were negative.  We discussed plans for an abdominal and pelvic CT.  Abdomen and pelvis CT on 06/28/2019 showed no acute abdominal/pelvic findings, mass lesions or adenopathy.  There was cholelithiasis and s/p nephrectomy with no findings for recurrent tumor or metastatic disease.  There was a small bulging midline pelvic hernia containing part of the dome of the bladder. There were stable small basilar pulmonary nodules.  During the interim, she has felt good.  She denies any abdominal complaints.  Review of labs dating back to 10/18/2017 revealed a fluctuating mildly elevated alkaline phosphatase between 126-136.  AST and ALT have been predominantly been normal except for 04/09/2017 and recent labs.   Past Medical History:    Diagnosis Date  . Anxiety   . Arthritis   . Breast cancer ([place]) 01/2019   invasive mammary carcinoma   . Chronic kidney disease 2006   Left Nephrectomy  . Complication of anesthesia    PONV  . Constipation   . Depression   . GERD (gastroesophageal reflux disease)   . Headache   . Hypertension   . PONV (postoperative nausea and vomiting)   . Rotator cuff disorder    LEFT  . Sarcoidosis   . Shortness of breath dyspnea    with exertion  . Sleep apnea     Past Surgical History:  Procedure Laterality Date  . ABDOMINAL HYSTERECTOMY     PARTIAL  . ACHILLES TENDON SURGERY Left 02/22/2015   Procedure: Secondary ACHILLES TENDON REPAIR;  Surgeon: Albertine Patricia, DPM;  Location: [facility] ORS;  Service: Podiatry;  Laterality: Left;  . Bladder tac    . BREAST BIOPSY Right 01/23/2019   affirm stereo bx of calcs with x marker, ductal hyperplasia  . BREAST BIOPSY Right 01/23/2019   [place] bx of mass with coil marker, invasive mammary carcinoma  . BREAST EXCISIONAL BIOPSY Left 2003   neg  . BREAST LUMPECTOMY Right 02/06/2019  . COLONOSCOPY  2013  . JOINT REPLACEMENT Left 2007   Total Knee Replacement  . KIDNEY CYST REMOVAL Left   . OSTECTOMY Left 02/22/2015   Procedure: OSTECTOMY;  Surgeon: Albertine Patricia, DPM;  Location: [facility] ORS;  Service: Podiatry;  Laterality: Left;  . PARTIAL MASTECTOMY WITH NEEDLE LOCALIZATION AND AXILLARY SENTINEL LYMPH NODE BX Right 02/06/2019   Procedure: PARTIAL MASTECTOMY WITH NEEDLE LOCALIZATION AND AXILLARY  SENTINEL LYMPH NODE BX;  Surgeon: Herbert Pun, MD;  Location: [facility] ORS;  Service: General;  Laterality: Right;  . SHOULDER ARTHROSCOPY W/ ROTATOR CUFF REPAIR Left YRS AGO  . TONSILLECTOMY  AGE 62  . TOTAL KNEE ARTHROPLASTY Right 10/22/2015   Procedure: RIGHT TOTAL KNEE ARTHROPLASTY;  Surgeon: Paralee Cancel, MD;  Location: [facility] ORS;  Service: Orthopedics;  Laterality: Right;  . TOTAL KNEE ARTHROPLASTY Right    10/22/2015    Family History  Problem  Relation Age of Onset  . Breast cancer Mother 7  . Heart failure Father   . Prostate cancer Father     Social History:  reports that she has never smoked. She has never used smokeless tobacco. She reports that she does not drink alcohol or use drugs. She denies any exposure to radiation or toxins. 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 Her husband died on Sep 24, 2013.Her first cousinis [name]([phone]).Her daughter, Pat,is her medical power of attorney. The patient is alone/accompanied by her 2 daughters today.  Participants in the patient's visit and their role in the encounter included the patient and Vito Berger, CMA, today.  The intake visit was provided by Vito Berger, CMA.   Allergies:  Allergies  Allergen Reactions  . Codeine Nausea And Vomiting  . Penicillins Hives and Other (See Comments)    Has patient had a PCN reaction causing immediate rash, facial/tongue/throat swelling, SOB or lightheadedness with hypotension: no Has patient had a PCN reaction causing severe rash involving mucus membranes or skin necrosis: no Has patient had a PCN reaction that required hospitalization no Has patient had a PCN reaction occurring within the last 10 years: no If all of the above answers are "NO", then may proceed with Cephalosporin use.     Current Medications: Current Outpatient Medications  Medication Sig Dispense Refill  . amLODipine (NORVASC) 10 MG tablet Take 10 mg by mouth at bedtime.    . cyclobenzaprine (FLEXERIL) 10 MG tablet Take 1 tablet by mouth as needed.    . diclofenac sodium (VOLTAREN) 1 % GEL Apply 1 application topically 2 (two) times daily as needed (foot pain).    Marland Kitchen docusate sodium (COLACE) 100 MG capsule Take 250 mg by mouth at bedtime.     . methotrexate (RHEUMATREX) 2.5 MG tablet TAKE 4 TABLETS (10 MG TOTAL) BY MOUTH ONCE A WEEK. CAUTION:CHEMOTHERAPY. PROTECT FROM LIGHT. (Patient not taking: Reported on 06/21/2019) 52 tablet  2  . metoprolol succinate (TOPROL-XL) 100 MG 24 hr tablet Take 100 mg by mouth at bedtime.     . Multiple Vitamin (MULTIVITAMIN WITH MINERALS) TABS tablet Take 0.5 tablets by mouth daily.     Marland Kitchen omeprazole (PRILOSEC) 20 MG capsule Take 20 mg by mouth daily.      No current facility-administered medications for this visit.    Review of Systems  Constitutional: Negative for chills, diaphoresis, fever, malaise/fatigue and weight loss (no new weight).       Feels "good".  HENT: Negative for congestion, ear pain, hearing loss, sinus pain and sore throat.        Chronic rhinorrhea.  Eyes: Negative for blurred vision and double vision.       Floaters.  Respiratory: Negative for hemoptysis, sputum production, shortness of breath and wheezing.        Sleep apnea on CPAP.  Cardiovascular: Negative.  Negative for chest pain, palpitations, orthopnea, claudication and leg swelling.  Gastrointestinal: Negative.  Negative for abdominal pain, blood in stool, constipation,  diarrhea, melena, nausea and vomiting.  Genitourinary: Negative for dysuria, frequency, hematuria and urgency.       Urine has a metallic smell.  Musculoskeletal: Negative for back pain (chronic), falls, joint pain (right knee bothered by the cold), myalgias and neck pain.  Skin: Negative.  Negative for itching and rash.  Neurological: Negative.  Negative for dizziness, tingling, sensory change, speech change, focal weakness, weakness and headaches.  Endo/Heme/Allergies: Negative.   Psychiatric/Behavioral: Positive for memory loss. Negative for depression. The patient is not nervous/anxious and does not have insomnia.   All other systems reviewed and are negative.  Performance status (ECOG): 0-1  Physical Exam  Constitutional: She is oriented to person, place, and time.  Neurological: She is alert and oriented to person, place, and time.  Psychiatric: She has a normal mood and affect. Her behavior is normal. Judgment and thought  content normal.  Nursing note reviewed.   No visits with results within 3 Day(s) from this visit.  Latest known visit with results is:  Appointment on 06/20/2019  Component Date Value Ref Range Status  . HCV Ab 06/20/2019 NON REACTIVE  NON REACTIVE Final   Comment: (NOTE) Nonreactive HCV antibody screen is consistent with no HCV infections,  unless recent infection is suspected or other evidence exists to indicate HCV infection. Performed at [facility], [name] [address] [place], [place] [address]   . Hepatitis B Surface Ag 06/20/2019 NON REACTIVE  NON REACTIVE Final   Performed at [facility], [place] [address] [place], [place] [address]  . Hep B Core Total Ab 06/20/2019 NON REACTIVE  NON REACTIVE Final   Performed at [facility], [place] [address], [place], [place] [address]  . Total Protein 06/20/2019 7.3  6.5 - 8.1 g/dL Final  . Albumin 06/20/2019 3.9  3.5 - 5.0 g/dL Final  . AST 06/20/2019 53* 15 - 41 U/L Final  . ALT 06/20/2019 55* 0 - 44 U/L Final  . Alkaline Phosphatase 06/20/2019 132* 38 - 126 U/L Final  . Total Bilirubin 06/20/2019 1.4* 0.3 - 1.2 mg/dL Final  . Bilirubin, Direct 06/20/2019 0.3* 0.0 - 0.2 mg/dL Final  . Indirect Bilirubin 06/20/2019 1.1* 0.3 - 0.9 mg/dL Final   Performed at [facility], [address] [place],  [address]  . WBC 06/20/2019 3.9* 4.0 - 10.5 K/uL Final  . RBC 06/20/2019 4.65  3.87 - 5.11 MIL/uL Final  . Hemoglobin 06/20/2019 14.1  12.0 - 15.0 g/dL Final  . HCT 06/20/2019 43.0  36.0 - 46.0 % Final  . MCV 06/20/2019 92.5  80.0 - 100.0 fL Final  . MCH 06/20/2019 30.3  26.0 - 34.0 pg Final  . MCHC 06/20/2019 32.8  30.0 - 36.0 g/dL Final  . RDW 06/20/2019 13.2  11.5 - 15.5 % Final  . Platelets 06/20/2019 158  150 - 400 K/uL Final  . nRBC 06/20/2019 0.0  0.0 - 0.2 % Final  . Neutrophils Relative % 06/20/2019 62  % Final  . Neutro Abs 06/20/2019 2.4  1.7 - 7.7 K/uL Final  . Lymphocytes Relative 06/20/2019 13   % Final  . Lymphs Abs 06/20/2019 0.5* 0.7 - 4.0 K/uL Final  . Monocytes Relative 06/20/2019 17  % Final  . Monocytes Absolute 06/20/2019 0.7  0.1 - 1.0 K/uL Final  . Eosinophils Relative 06/20/2019 7  % Final  . Eosinophils Absolute 06/20/2019 0.3  0.0 - 0.5 K/uL Final  . Basophils Relative 06/20/2019 1  %  Final  . Basophils Absolute 06/20/2019 0.0  0.0 - 0.1 K/uL Final  . Immature Granulocytes 06/20/2019 0  % Final  . Abs Immature Granulocytes 06/20/2019 0.01  0.00 - 0.07 K/uL Final   Performed at [facility], [address], [place], [place] [address]    Assessment:  Jill Bond is a 69 y.o. female withstage IAright breast cancers/plumpectomy on 02/06/2019. Pathology([id number]) revealed a1.2 cm grade I invasive carcinoma of no special type. Low grade DCIS was present. Closest margin was 4 mm. One of threesentinellymph nodes were positive for micro metastatic carcinoma(>0.2 to 2 mm). Margins are negative for atypia and malignancy.Tumor was ER positive (>90%) PR positive (51-90%) and HER-2/neu negative (1+).Pathologic stagewas pT1c pN17m.  Oncotype DX testingon 03/09/2019 revealed a recurrence score of 16 with a distant recurrence risk at 9 years of 15% (95% CI 10-19%) and no apparent benefit of chemotherapy.   Bilateralscreening mammogramon 12/16/2018 revealed a mass in the right breast.There were no suspicious findings in the left breast.Right diagnosticmammogramand ultrasoundon 01/17/2019 revealed a1 x 1.2 x 1.1 cm hypoechoicmass in the right breast at the 9:30 position, 7 cm from the nipple. There were indeterminate calcifications in the upper-outerquadrant of theright breast. Lymph nodes were unremarkable.  She received right breast radiation from 03/20/2019 - 05/15/2019.  She hassarcoidosiss/p utrasound guided supraclavicular lymph node biopsy on 03/11/2016. Pathologyrevealed non-necrotizing granulomatousinflammation  with fibrosis. There was no evidence of malignancy. GMS-fungal, AFB and Gram stainswerenegative.She presented withsmall upper abdominal adenopathy,bibasilar lung nodes, a30 pound weight loss,some drenching night sweats, and mild hypercalcemia.   Abdominal and pelvis CTscan on 02/26/2016 revealed multiple new bibasilar lung nodules measuring up to 5 mm in size. There was new para-aortic and gastrohepatic lymph nodes measuring up to 1 cm in size, suspicious for metastatic disease.  Abdomen and pelvis CT on 06/28/2019 showed no acute abdominal/pelvic findings, mass lesions or adenopathy.  There was cholelithiasis and s/p nephrectomy with no findings for recurrent tumor or metastatic disease.  There was a small bulging midline pelvic hernia containing part of the dome of the bladder. There were stable small basilar pulmonary nodules.  She has a history ofmultiple angiomyolipomass/p left nephrectomy on 03/15/2006. Baseline creatinineis 0.9 - 1.1 (CrCl 50-63 ml/min).  Bone densityon 02/22/2019 was normal with a T-score of -0.5 at the right femur neck.   Colonoscopyin 2013 revealed polyps (next scheduled 2018). She had a partial hysterectomy at age [id number]  Symptomatically, she is doing well.  She denies any concerns.  Plan: 1.   Review labs from 06/20/2019. 2.   Stage IA right breast cancer She is s/plumpectomyand SLN biopsy on 02/06/2019. OncoType DX testingrevealed a recurrence score of 16. No benefit of chemotherapy. She is s/p right breast radiation (completed on 05/15/2019). Review plans for initiation of Femara.   Side effects reviewed.  Rx:  Femara 2.5 mg po q day (dis:#30; 1 refill). 3.   Elevated LFTs  AST 53.  ALT 55.  Bilirubin 1.4 (direct 0.3).  Alkaline phosphatase 132.  Abdomen and pelvis CT on 06/28/2019 was personally reviewed.  Agree with radiology interpretation.   No evidence of  metastatic disease.   There are stable small bibasilar pulmonary nodules.  [facility]. 4.   Pulmonary sarcoid  Patient was seen in the pulmonary clinic by Dr FOttie Glazieron 06/06/2019.   She was on MTX 10 mg/week.  Patient to call pulmonary clinic for follow-up.  5.   RTC in 1 month for MD assessment  and labs (CMP).  I discussed the assessment and treatment plan with the patient.  The patient was provided an opportunity to ask questions and all were answered.  The patient agreed with the plan and demonstrated an understanding of the instructions.  The patient was advised to call back if the symptoms worsen or if the condition fails to improve as anticipated.  I provided 17 minutes (9:12 AM - 9:29 AM) of non-face-to-face telephone time during this encounter.  I provided these services from the [facility] office.   Lequita Asal, MD, PhD    06/30/2019, 7:47 PM  I, Samul Dada, am acting as a scribe for Lequita Asal, MD.  I, Hodges Mike Gip, MD, have reviewed the above documentation for accuracy and completeness, and I agree with the above.

## 2019-06-26 ENCOUNTER — Ambulatory Visit: Payer: Medicare HMO | Admitting: Hematology and Oncology

## 2019-06-28 ENCOUNTER — Ambulatory Visit
Admission: RE | Admit: 2019-06-28 | Discharge: 2019-06-28 | Disposition: A | Discharge status: Home / Self Care | Payer: Medicare HMO | Source: Ambulatory Visit | Attending: Hematology and Oncology | Admitting: Hematology and Oncology

## 2019-06-28 ENCOUNTER — Other Ambulatory Visit: Payer: Self-pay

## 2019-06-28 ENCOUNTER — Encounter: Payer: Self-pay | Admitting: Hematology and Oncology

## 2019-06-28 DIAGNOSIS — C50411 Malignant neoplasm of upper-outer quadrant of right female breast: Secondary | ICD-10-CM | POA: Insufficient documentation

## 2019-06-28 DIAGNOSIS — R195 Other fecal abnormalities: Secondary | ICD-10-CM | POA: Diagnosis not present

## 2019-06-28 DIAGNOSIS — R590 Localized enlarged lymph nodes: Secondary | ICD-10-CM | POA: Diagnosis not present

## 2019-06-28 DIAGNOSIS — N3289 Other specified disorders of bladder: Secondary | ICD-10-CM | POA: Diagnosis not present

## 2019-06-28 DIAGNOSIS — Z170 Estrogen receptor positive status [ER+]: Secondary | ICD-10-CM | POA: Insufficient documentation

## 2019-06-28 DIAGNOSIS — K8020 Calculus of gallbladder without cholecystitis without obstruction: Secondary | ICD-10-CM | POA: Diagnosis not present

## 2019-06-28 DIAGNOSIS — R7989 Other specified abnormal findings of blood chemistry: Secondary | ICD-10-CM | POA: Insufficient documentation

## 2019-06-28 NOTE — Progress Notes (Signed)
No new changes noted today. The patient name and DOB has been verified by phone today. 

## 2019-06-29 ENCOUNTER — Ambulatory Visit: Admission: RE | Admit: 2019-06-29 | Payer: Medicare HMO | Source: Ambulatory Visit

## 2019-06-30 ENCOUNTER — Inpatient Hospital Stay (HOSPITAL_BASED_OUTPATIENT_CLINIC_OR_DEPARTMENT_OTHER): Payer: Medicare HMO | Admitting: Hematology and Oncology

## 2019-06-30 ENCOUNTER — Encounter: Payer: Self-pay | Admitting: Hematology and Oncology

## 2019-06-30 ENCOUNTER — Telehealth: Payer: Self-pay

## 2019-06-30 DIAGNOSIS — Z170 Estrogen receptor positive status [ER+]: Secondary | ICD-10-CM | POA: Diagnosis not present

## 2019-06-30 DIAGNOSIS — R7989 Other specified abnormal findings of blood chemistry: Secondary | ICD-10-CM | POA: Diagnosis not present

## 2019-06-30 DIAGNOSIS — C50411 Malignant neoplasm of upper-outer quadrant of right female breast: Secondary | ICD-10-CM

## 2019-06-30 MED ORDER — LETROZOLE 2.5 MG PO TABS: 2.5000 mg | ORAL_TABLET | Freq: Every day | ORAL | 1 refills | Status: DC

## 2019-06-30 NOTE — Telephone Encounter (Signed)
Survivorship Care Plan visit completed.  Treatment summary reviewed and mailed to patient.  ASCO answers booklet reviewed and mailed to patient.  CARE program and Cancer Transitions discussed with patient along with other resources cancer center offers to patients and caregivers.  Patient verbalized understanding.  Pt in agreement for APP to call her on March 1st in am to discuss Survivorship Clinic.   Packet mailed 

## 2019-07-10 ENCOUNTER — Other Ambulatory Visit: Payer: Self-pay | Admitting: Oncology

## 2019-07-10 ENCOUNTER — Inpatient Hospital Stay: Payer: Medicare HMO | Attending: Nurse Practitioner | Admitting: Oncology

## 2019-07-10 DIAGNOSIS — Z170 Estrogen receptor positive status [ER+]: Secondary | ICD-10-CM | POA: Diagnosis not present

## 2019-07-10 DIAGNOSIS — C50411 Malignant neoplasm of upper-outer quadrant of right female breast: Secondary | ICD-10-CM | POA: Diagnosis not present

## 2019-07-10 NOTE — Progress Notes (Signed)
CLINIC:  Survivorship   REASON FOR VISIT:  Routine follow-up for history of breast cancer.   I connected with Jill Bond on 07/10/19 at  9:30 AM EST by telephone visit and verified that I am speaking with the correct person using two identifiers.   I discussed the limitations, risks, security and privacy concerns of performing an evaluation and management service by telemedicine and the availability of in-person appointments. I also discussed with the patient that there may be a patient responsible charge related to this service. The patient expressed understanding and agreed to proceed.   Other persons participating in the visit and their role in the encounter: None  Patient's location: Home Provider's location: Office  BRIEF ONCOLOGIC HISTORY:  Oncology History Overview Note   Jill Bond is a 69 y.o. female with stage IA right breast cancer s/p lumpectomy on 02/06/2019.  Pathology [phone]) revealed a 1.2 cm grade I invasive carcinoma of no special type.  Low grade DCIS was present.  Closest margin was 4 mm.  One of three sentinel lymph nodes were positive for micro metastatic carcinoma (> 0.2 to 2 mm). Margins are negative for atypia and malignancy.  Tumor was ER positive (> 90%) PR positive (51-90%) and HER-2/neu negative (1+).  Pathologic stage was pT1c pN23m.   Oncotype DX testing on 03/09/2019 revealed a recurrence score of 16 with a distant recurrence risk at 9 years of 15% (95% CI 10-19%) and no apparent benefit of chemotherapy.     Bilateral screening mammogram on 12/16/2018 revealed a mass in the right breast.  There were no suspicious findings in the left breast.  Right diagnostic mammogram and ultrasound on 01/17/2019 revealed a  1 x 1.2 x 1.1 cm hypoechoic mass in the right breast at the 9:30 position, 7 cm from the nipple.  There were indeterminate calcifications in the upper-outer quadrant of the right breast.  Lymph nodes were unremarkable.   She received  right breast radiation from 03/20/2019 - 05/15/2019.   She has sarcoidosis s/p utrasound guided supraclavicular lymph node biopsy on 03/11/2016.  Pathology revealed non-necrotizing granulomatous inflammation with fibrosis.  There was no evidence of malignancy.  GMS-fungal, AFB and Gram stains were negative.  She presented with small upper abdominal adenopathy, bibasilar lung nodes, a 30 pound weight loss, some drenching night sweats, and mild hypercalcemia.     Abdominal and pelvic CT scan on 02/26/2016 revealed multiple new bibasilar lung nodules measuring up to 5 mm in size. There was new para-aortic and gastrohepatic lymph nodes measuring up to 1 cm in size, suspicious for metastatic disease.   She has a history of multiple angiomyolipomas s/p left nephrectomy on 03/15/2006.  Baseline creatinine is 0.9 - 1.1 (CrCl 50-63 ml/min).   Bone density on 02/22/2019 was normal with a T-score of -0.5 at the right femur neck.    Colonoscopy in 2013 revealed polyps (next scheduled 2018).  She had a partial hysterectomy at age 69    Malignant neoplasm of upper-outer quadrant of right breast in female, estrogen receptor positive ([place]  01/31/2019 Initial Diagnosis   Malignant neoplasm of upper-outer quadrant of right breast in female, estrogen receptor positive ([place]    INTERVAL HISTORY:  [facility] of breast cancer.  Overall, she reports feeling quite well since completing lumpectomy, SLN biopsy and radiation.  She does admit to some right breast tenderness at the lumpectomy scar as well as some axillary  tenderness and discomfort.  She will be seeing her surgeon tomorrow Dr. Windell Moment to express her concerns.  She denies any concerns on Femara at this time.    REVIEW OF SYSTEMS:  Review of Systems  Constitutional: Positive for fatigue. Negative for appetite change, fever and unexpected weight change.  HENT:   Negative  for nosebleeds, sore throat and trouble swallowing.   Eyes: Negative.   Respiratory: Negative.  Negative for cough, shortness of breath and wheezing.   Cardiovascular: Negative.  Negative for chest pain and leg swelling.       Right breast tenderness-at lumpectomy scar  Gastrointestinal: Negative for abdominal pain, blood in stool, constipation, diarrhea, nausea and vomiting.  Endocrine: Negative.   Genitourinary: Negative.  Negative for bladder incontinence, hematuria and nocturia.   Musculoskeletal: Negative.  Negative for back pain and flank pain.       Right axillary pain  Skin: Negative.   Neurological: Negative.  Negative for dizziness, headaches, light-headedness and numbness.  Hematological: Negative.   Psychiatric/Behavioral: Negative.  Negative for confusion. The patient is not nervous/anxious.     PAST MEDICAL/SURGICAL HISTORY:  Past Medical History:  Diagnosis Date  . Anxiety   . Arthritis   . Breast cancer ([place]) 01/2019   invasive mammary carcinoma   . Chronic kidney disease 2006   Left Nephrectomy  . Complication of anesthesia    PONV  . Constipation   . Depression   . GERD (gastroesophageal reflux disease)   . Headache   . Hypertension   . PONV (postoperative nausea and vomiting)   . Rotator cuff disorder    LEFT  . Sarcoidosis   . Shortness of breath dyspnea    with exertion  . Sleep apnea    Past Surgical History:  Procedure Laterality Date  . ABDOMINAL HYSTERECTOMY     PARTIAL  . ACHILLES TENDON SURGERY Left 02/22/2015   Procedure: Secondary ACHILLES TENDON REPAIR;  Surgeon: Albertine Patricia, DPM;  Location: [facility] ORS;  Service: Podiatry;  Laterality: Left;  . Bladder tac    . BREAST BIOPSY Right 01/23/2019   affirm stereo bx of calcs with x marker, ductal hyperplasia  . BREAST BIOPSY Right 01/23/2019   [place] bx of mass with coil marker, invasive mammary carcinoma  . BREAST EXCISIONAL BIOPSY Left 2003   neg  . BREAST LUMPECTOMY Right 02/06/2019  .  COLONOSCOPY  2013  . JOINT REPLACEMENT Left 2007   Total Knee Replacement  . KIDNEY CYST REMOVAL Left   . OSTECTOMY Left 02/22/2015   Procedure: OSTECTOMY;  Surgeon: Albertine Patricia, DPM;  Location: [facility] ORS;  Service: Podiatry;  Laterality: Left;  . PARTIAL MASTECTOMY WITH NEEDLE LOCALIZATION AND AXILLARY SENTINEL LYMPH NODE BX Right 02/06/2019   Procedure: PARTIAL MASTECTOMY WITH NEEDLE LOCALIZATION AND AXILLARY SENTINEL LYMPH NODE BX;  Surgeon: Herbert Pun, MD;  Location: [facility] ORS;  Service: General;  Laterality: Right;  . SHOULDER ARTHROSCOPY W/ ROTATOR CUFF REPAIR Left YRS AGO  . TONSILLECTOMY  AGE 76  . TOTAL KNEE ARTHROPLASTY Right 10/22/2015   Procedure: RIGHT TOTAL KNEE ARTHROPLASTY;  Surgeon: Paralee Cancel, MD;  Location: [facility];  Service: Orthopedics;  Laterality: Right;  . TOTAL KNEE ARTHROPLASTY Right    10/22/2015     ALLERGIES:  Allergies  Allergen Reactions  . Codeine Nausea And Vomiting  . Penicillins Hives and Other (See Comments)    Has patient had a PCN reaction causing immediate rash, facial/tongue/throat swelling, SOB or lightheadedness with hypotension: no Has patient had a  PCN reaction causing severe rash involving mucus membranes or skin necrosis: no Has patient had a PCN reaction that required hospitalization no Has patient had a PCN reaction occurring within the last 10 years: no If all of the above answers are "NO", then may proceed with Cephalosporin use.      CURRENT MEDICATIONS:  Outpatient Encounter Medications as of 07/10/2019  Medication Sig  . amLODipine (NORVASC) 10 MG tablet Take 10 mg by mouth at bedtime.  . cyclobenzaprine (FLEXERIL) 10 MG tablet Take 1 tablet by mouth as needed.  . diclofenac sodium (VOLTAREN) 1 % GEL Apply 1 application topically 2 (two) times daily as needed (foot pain).  Marland Kitchen docusate sodium (COLACE) 100 MG capsule Take 250 mg by mouth at bedtime.   Marland Kitchen letrozole (FEMARA) 2.5 MG tablet Take 1 tablet (2.5 mg total) by  mouth daily.  . methotrexate (RHEUMATREX) 2.5 MG tablet TAKE 4 TABLETS (10 MG TOTAL) BY MOUTH ONCE A WEEK. CAUTION:CHEMOTHERAPY. PROTECT FROM LIGHT. (Patient not taking: Reported on 06/21/2019)  . metoprolol succinate (TOPROL-XL) 100 MG 24 hr tablet Take 100 mg by mouth at bedtime.   . Multiple Vitamin (MULTIVITAMIN WITH MINERALS) TABS tablet Take 0.5 tablets by mouth daily.   Marland Kitchen omeprazole (PRILOSEC) 20 MG capsule Take 20 mg by mouth daily.    No facility-administered encounter medications on file as of 07/10/2019.    ONCOLOGIC FAMILY HISTORY:  Family History  Problem Relation Age of Onset  . Breast cancer Mother 61  . Heart failure Father   . Prostate cancer Father     GENETIC COUNSELING/TESTING: None  SOCIAL HISTORY:  LARESHA BACORN is single and lives with her daughter in [place].     PHYSICAL EXAMINATION:  Vital Signs: Limited d/t Virtual Platform There were no vitals filed for this visit. There were no vitals filed for this visit.  LABORATORY DATA:  Lab Results  Component Value Date   WBC 3.9 (L) 06/20/2019   HGB 14.1 06/20/2019   HCT 43.0 06/20/2019   MCV 92.5 06/20/2019   PLT 158 06/20/2019      Chemistry      Component Value Date/Time   NA 140 06/13/2019 0928   K 3.7 06/13/2019 0928   CL 101 06/13/2019 0928   CO2 28 06/13/2019 0928   BUN 19 06/13/2019 0928   CREATININE 1.18 (H) 06/13/2019 0928      Component Value Date/Time   CALCIUM 9.7 06/13/2019 0928   ALKPHOS 132 (H) 06/20/2019 0819   AST 53 (H) 06/20/2019 0819   ALT 55 (H) 06/20/2019 0819   BILITOT 1.4 (H) 06/20/2019 0819      DIAGNOSTIC IMAGING:  CT abdomen/pelvis  IMPRESSION: 1. No acute abdominal/pelvic findings, mass lesions or adenopathy. 2. Cholelithiasis. 3. Status post left nephrectomy. No findings for recurrent tumor or metastatic disease. 4. Small bulging midline pelvic hernia containing part of the dome of the bladder. 5. Moderate stool throughout the colon and down into  the rectum may suggest constipation. 6. Stable small basilar pulmonary nodules.  Mammogram-01/17/19 IMPRESSION: 1.  Suspicious mass in the right breast at the 9:30 position.  2.  Indeterminate calcifications in the upper-outer right breast.  RECOMMENDATION: 1. Ultrasound-guided biopsy of the suspicious mass in the upper-outer right breast.  2. Recommend stereotactic guided biopsy of the indeterminate calcifications in the upper-outer right breast.  I have discussed the findings and recommendations with the patient. If applicable, a reminder letter will be sent to the patient regarding the next appointment.  BI-RADS CATEGORY  5: Highly suggestive of malignancy.  ASSESSMENT AND PLAN:  Ms.. Pugsley is a pleasant 69 y.o. female with history of Stage 1A right breast cancer who is status post lumpectomy and SLN biopsy on 02/06/2019.  Oncotype revealed a recurrence score of 16.  No benefit for chemotherapy.  She is also status post right breast radiation completed on 05/15/2019.  She began Femara in January 2021 for 5 years. She has history of a right shoulder skin cancer status post removal by Dr Verlee Rossetti.  She developed some blistering of right breast with radiation which has completely resolved. She presents to the Survivorship [facility] for surveillance and routine follow-up.   1. History of breast cancer:  Ms. Rudy is currently clinically and radiographically without evidence of disease or recurrence of breast cancer. She will be due for mammogram in September 2020.  She will take letrozole for approximately 5 years.  She will return to the cancer center is to see Dr. Mike Gip on 08/01/2019 for lab work and to assess tolerance of letrozole.  I encouraged her to call with any questions or concerns before the next visit.  #. Problem(s) at Visit  1. Breast incision and axilla tenderness/discomfort: This is not an acute concern.  She has had the same incision tenderness and axillary discomfort  since she had the lumpectomy and SLN dissection.  Patient will be seeing Dr. Windell Moment tomorrow to discuss her concerns of tenderness at her lumpectomy site and axillary discomfort.  I explained that this is fairly normal after lumpectomy and sentinel node dissection but given I was unable to perform a breast exam, I agreed with follow-up with her surgeon.  2.  Patient has questions regarding dietary supplements.  She is currently not taking her multivitamin as prior to her diagnosis.  I recommend she resume her multivitamin which includes calcium and vitamin D.  She needs to make sure she is getting enough calcium and vitamin D.  Instructed her to look at the label and figure out how many milligrams of calcium she is getting daily.  She also had questions about initiating fish oil.  States she was worried that given her history of kidney cancer status post nephrectomy, she was told it may affect her good kidney.  I reviewed up-to-date and it appears to be absorbed in the small intestines.  There is no renal impairment adjustment per up-to-date.  Only contraindications are for those who have experience hypersensitivity to omega-3 fatty acids or component of formulation.  Other side effects include bleeding, fish allergy hypersensitivity and hepatic effects.  She will have labs with Dr. Mike Gip at the end of the month.  #. Bone health:  Given Ms. Aldava's age, history of breast cancer, and her current anti-estrogen therapy with letrozole, she is at risk for bone demineralization. Her last DEXA scan was on 02/22/2019 revealing a T score of -0.5 which is normal.  She will be due to for a repeat DEXA in approximately 2 years from previous..  In the meantime, she was encouraged to increase her consumption of foods rich in calcium, as well as increase her weight-bearing activities.  She was given education on specific food and activities to promote bone health.  Patient had questions regarding initiation of her  multivitamin and or calcium/vitamin D supplementation.   Recommended amounts of calcium is 1000 mg (ages 56-50) with an increase to 1200 mg daily when over the age of 77.  This equals 2 servings of a calcium rich food like  dairy or foods and beverages fortified with calcium.  Adults who can eat cheese, yogurt, milk and fortified beverages daily are likely getting sufficient calcium from their food and do not need a supplement.  As we get older and hormone levels drop more calcium is needed to reduce bone loss that can lead to the risk of fractures.  Meeting this calcium requirement can be challenging in older adults.  Only about 30% of women in the [place] get enough calcium from their diet.  Sometimes even if consuming sufficient calcium from their food they are not absorbing due to vitamin D deficiency.  Without vitamin D, calcium will not be absorbed and benefit the body.   #. Cancer screening:  Due to Ms. Vonada's history and her age, she should receive screening for skin cancers, colon cancer, and gynecologic cancers. She was encouraged to follow-up with her PCP for appropriate cancer screenings.   #. Health maintenance and wellness promotion: Ms. Olberding was encouraged to consume 5-7 servings of fruits and vegetables per day. She was also encouraged to engage in moderate to vigorous exercise for 30 minutes per day most days of the week. She was instructed to limit her alcohol consumption and continue to abstain from tobacco use.   We reviewed NCCN guidelines for DCIS postsurgical treatment and surveillance.     Per guidelines her follow-up is appropriate and scheduled.  Dispo:  RTC this month for history and physical exam with Dr. Mike Gip. RTC 11/22/19  for radiation oncology follow-up. Next mammogram will be due in September 2021-this will be scheduled at her next visit. Return to the survivorship clinic as needed; no additional follow-up needed at this time.  I provided 15 minutes of non  face-to-face telephone visit time during this encounter, and > 50% was spent counseling as documented under my assessment & plan.  Rulon Abide, AGNP-C [place] at [facility] [phone] (office) 07/06/19 10:05 AM    Note: PRIMARY CARE PROVIDER [place], [place] [phone]

## 2019-07-11 ENCOUNTER — Other Ambulatory Visit: Payer: Medicare HMO

## 2019-07-11 ENCOUNTER — Ambulatory Visit: Payer: Medicare HMO | Admitting: Hematology and Oncology

## 2019-07-11 DIAGNOSIS — C50411 Malignant neoplasm of upper-outer quadrant of right female breast: Secondary | ICD-10-CM | POA: Diagnosis not present

## 2019-07-14 NOTE — Addendum Note (Signed)
Addended by: Malcome Ambrocio E on: 07/14/2019 12:42 PM   Modules accepted: Level of Service  

## 2019-07-18 ENCOUNTER — Encounter: Payer: Self-pay | Admitting: Hematology and Oncology

## 2019-07-22 ENCOUNTER — Other Ambulatory Visit: Payer: Self-pay | Admitting: Hematology and Oncology

## 2019-07-22 DIAGNOSIS — Z170 Estrogen receptor positive status [ER+]: Secondary | ICD-10-CM

## 2019-07-22 DIAGNOSIS — C50411 Malignant neoplasm of upper-outer quadrant of right female breast: Secondary | ICD-10-CM

## 2019-07-30 NOTE — Progress Notes (Signed)
[facility]  [address] [place], [place] [address] Phone: [phone]  Fax: [phone]   Clinic Day:  08/01/2019  Referring physician: Baxter Hire, MD  Chief Complaint: Jill Bond is a 69 y.o. female with sarcoid andstage IAright breastcancer who is seen for 1 month assessment on Femara.   HPI: The patient was last seen in the medical oncology clinic on 06/29/2019 via telephone. At that time, she was doing well.  Right breast changes s/p radiation had healed. LFTs on 06/20/2019 included an AST 53, ALT 55, alkaline phosphatase 132, total bilirubin 1.4 (direct 0.3). Hepatitis B surface antigen , hepatitis B core total antibody, and  Hepatitis C antibody were non reactive.    Abdomen and pelvis CT on 06/28/2019 showed no acute abdominal/pelvic findings, mass lesions or adenopathy.  There was cholelithiasis and s/p nephrectomy with no findings for recurrent tumor or metastatic disease.  There was a small bulging midline pelvic hernia containing part of the dome of the bladder. There were stable small basilar pulmonary nodules.  She began Femara on 06/29/2019.  She was seen in the survivorship clinic by Faythe Casa, NP on 07/10/2019. She was feeling well since completing lumpectomy, SLN biopsy and radiation. She noted some fatigue.   She was seen by Dr Windell Moment on 07/11/2019.  She was tolerating antiestrogen therapy. She denied any pain or skin changes. She was scheduled for a follow up and mammogram in 01/2020.   During the interim, the patient has been doing great. Patient is tolerating Femara well. She notes having one or two hot flashes.  She has a knot on her hand but denies trauma. Denies any joint or bone pain. Reports right arm edema x 2-3 weeks with associated pain the has resolved. She noticed that the sleeves to her tops are fitting tighter than usual. She notes a blister on her left and right breast.   Physical exam showed a  blister on the left breast that appeared not to be healing. I recommended keeping it dry and using Nystatin powder. She reports being back on methotrexate x 1 month. Her blood counts have not been monitored. She is not on any folic acid.    Past Medical History:  Diagnosis Date  . Anxiety   . Arthritis   . Breast cancer ([place]) 01/2019   invasive mammary carcinoma   . Chronic kidney disease 2006   Left Nephrectomy  . Complication of anesthesia    PONV  . Constipation   . Depression   . GERD (gastroesophageal reflux disease)   . Headache   . Hypertension   . PONV (postoperative nausea and vomiting)   . Rotator cuff disorder    LEFT  . Sarcoidosis   . Shortness of breath dyspnea    with exertion  . Sleep apnea     Past Surgical History:  Procedure Laterality Date  . ABDOMINAL HYSTERECTOMY     PARTIAL  . ACHILLES TENDON SURGERY Left 02/22/2015   Procedure: Secondary ACHILLES TENDON REPAIR;  Surgeon: Albertine Patricia, DPM;  Location: [facility] ORS;  Service: Podiatry;  Laterality: Left;  . Bladder tac    . BREAST BIOPSY Right 01/23/2019   affirm stereo bx of calcs with x marker, ductal hyperplasia  . BREAST BIOPSY Right 01/23/2019   [place] bx of mass with coil marker, invasive mammary carcinoma  . BREAST EXCISIONAL BIOPSY Left 2003   neg  . BREAST LUMPECTOMY Right 02/06/2019  . COLONOSCOPY  2013  . JOINT  REPLACEMENT Left 2007   Total Knee Replacement  . KIDNEY CYST REMOVAL Left   . OSTECTOMY Left 02/22/2015   Procedure: OSTECTOMY;  Surgeon: Albertine Patricia, DPM;  Location: [facility] ORS;  Service: Podiatry;  Laterality: Left;  . PARTIAL MASTECTOMY WITH NEEDLE LOCALIZATION AND AXILLARY SENTINEL LYMPH NODE BX Right 02/06/2019   Procedure: PARTIAL MASTECTOMY WITH NEEDLE LOCALIZATION AND AXILLARY SENTINEL LYMPH NODE BX;  Surgeon: Herbert Pun, MD;  Location: [facility] ORS;  Service: General;  Laterality: Right;  . SHOULDER ARTHROSCOPY W/ ROTATOR CUFF REPAIR Left YRS AGO  . TONSILLECTOMY   AGE 89  . TOTAL KNEE ARTHROPLASTY Right 10/22/2015   Procedure: RIGHT TOTAL KNEE ARTHROPLASTY;  Surgeon: Paralee Cancel, MD;  Location: [facility];  Service: Orthopedics;  Laterality: Right;  . TOTAL KNEE ARTHROPLASTY Right    10/22/2015    Family History  Problem Relation Age of Onset  . Breast cancer Mother 36  . Heart failure Father   . Prostate cancer Father     Social History:  reports that she has never smoked. She has never used smokeless tobacco. She reports that she does not drink alcohol or use drugs. She denies any exposure to radiation or toxins. 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 Her husband died on 09/25/2013.Her first cousinis [name]([phone]).Her daughter, [name],is her medical power of attorney. The patient is alone today.  Allergies:  Allergies  Allergen Reactions  . Codeine Nausea And Vomiting  . Penicillins Hives and Other (See Comments)    Has patient had a PCN reaction causing immediate rash, facial/tongue/throat swelling, SOB or lightheadedness with hypotension: no Has patient had a PCN reaction causing severe rash involving mucus membranes or skin necrosis: no Has patient had a PCN reaction that required hospitalization no Has patient had a PCN reaction occurring within the last 10 years: no If all of the above answers are "NO", then may proceed with Cephalosporin use.     Current Medications: Current Outpatient Medications  Medication Sig Dispense Refill  . amLODipine (NORVASC) 10 MG tablet Take 10 mg by mouth at bedtime.    . cyclobenzaprine (FLEXERIL) 10 MG tablet Take 1 tablet by mouth as needed.    . diclofenac sodium (VOLTAREN) 1 % GEL Apply 1 application topically 2 (two) times daily as needed (foot pain).    Marland Kitchen docusate sodium (COLACE) 100 MG capsule Take 250 mg by mouth at bedtime.     Marland Kitchen letrozole (FEMARA) 2.5 MG tablet TAKE 1 TABLET BY MOUTH EVERY DAY 30 tablet 1  . methotrexate (RHEUMATREX) 2.5 MG tablet TAKE 4 TABLETS  (10 MG TOTAL) BY MOUTH ONCE A WEEK. CAUTION:CHEMOTHERAPY. PROTECT FROM LIGHT. 52 tablet 2  . metoprolol succinate (TOPROL-XL) 100 MG 24 hr tablet Take 100 mg by mouth at bedtime.     . Multiple Vitamin (MULTIVITAMIN WITH MINERALS) TABS tablet Take 0.5 tablets by mouth daily.     Marland Kitchen omeprazole (PRILOSEC) 20 MG capsule Take 20 mg by mouth daily.      No current facility-administered medications for this visit.    Review of Systems  Constitutional: Negative for chills, diaphoresis, fever, malaise/fatigue and weight loss (stable).       Doing great.  HENT: Negative for congestion, ear discharge, ear pain, hearing loss, nosebleeds, sinus pain and sore throat.        Chronic rhinorrhea.  Eyes: Negative for blurred vision.       Floaters.  Respiratory: Negative for cough, hemoptysis, sputum production and shortness of  breath.        Sleep apnea on CPAP at night.  Plans to see a new pulmonologist.   Cardiovascular: Positive for leg swelling (right arm x 2-3 weeks). Negative for chest pain and palpitations.  Gastrointestinal: Negative for abdominal pain, blood in stool, constipation, diarrhea, heartburn, melena, nausea and vomiting.  Genitourinary: Negative for dysuria, frequency, hematuria and urgency.  Musculoskeletal: Negative for back pain (chronic), joint pain (right knee bothered by cold), myalgias and neck pain.  Skin: Negative for itching and rash.       Blistering right and left breast s/p radiation. Knot on hand.  Neurological: Negative for dizziness, tingling, sensory change, weakness and headaches.  Endo/Heme/Allergies: Does not bruise/bleed easily.       2 episodes of hot flashes.  Psychiatric/Behavioral: Positive for memory loss. Negative for depression. The patient is not nervous/anxious and does not have insomnia.   All other systems reviewed and are negative.   Performance status (ECOG): 1  Vitals Blood pressure (!) 154/85, pulse 86, temperature (!) 96.3 F (35.7 C),  temperature source Tympanic, resp. rate 18, weight 233 lb 2.2 oz (105.7 kg), SpO2 98 %.   Physical Exam  Constitutional: She is oriented to person, place, and time. She appears well-developed and well-nourished. No distress.  HENT:  Head: Normocephalic and atraumatic.  Mouth/Throat: Oropharynx is clear and moist. No oropharyngeal exudate.  Long brown hair. Mask.  Eyes: Pupils are equal, round, and reactive to light. Conjunctivae and EOM are normal. No scleral icterus.  Glasses. Blue eyes.  Cardiovascular: Normal rate, regular rhythm and normal heart sounds.  No murmur heard. Pulmonary/Chest: Effort normal and breath sounds normal. No respiratory distress. She has no wheezes. She has no rales. She exhibits edema (right breast). She exhibits no tenderness. Right breast exhibits no inverted nipple, no mass, no nipple discharge, no skin change and no tenderness. Left breast exhibits skin change (skin break down 3 mm x 10 mm; linear). Left breast exhibits no inverted nipple, no mass, no nipple discharge and no tenderness.  RIGHT breast with moisture trapped under breast. LEFT breast blister appears to not be healing.  Abdominal: Soft. Bowel sounds are normal. She exhibits no distension and no mass. There is no abdominal tenderness. There is no rebound and no guarding.  Musculoskeletal:        General: No edema. Normal range of motion.     Right upper arm: Tenderness present.     Cervical back: Normal range of motion and neck supple.  Lymphadenopathy:    She has no cervical adenopathy.    She has no axillary adenopathy.       Right: No supraclavicular adenopathy present.       Left: No supraclavicular adenopathy present.  Neurological: She is alert and oriented to person, place, and time.  Skin: Skin is warm and dry. She is not diaphoretic.  Psychiatric: She has a normal mood and affect. Her behavior is normal. Judgment and thought content normal.  Nursing note and vitals  reviewed.   Appointment on 08/01/2019  Component Date Value Ref Range Status  . Sodium 08/01/2019 138  135 - 145 mmol/L Final  . Potassium 08/01/2019 3.8  3.5 - 5.1 mmol/L Final  . Chloride 08/01/2019 103  98 - 111 mmol/L Final  . CO2 08/01/2019 26  22 - 32 mmol/L Final  . Glucose, Bld 08/01/2019 188* 70 - 99 mg/dL Final   Glucose reference range applies only to samples taken after fasting for at least 8  hours.  . BUN 08/01/2019 19  8 - 23 mg/dL Final  . Creatinine, Ser 08/01/2019 1.08* 0.44 - 1.00 mg/dL Final  . Calcium 08/01/2019 9.6  8.9 - 10.3 mg/dL Final  . Total Protein 08/01/2019 6.7  6.5 - 8.1 g/dL Final  . Albumin 08/01/2019 3.7  3.5 - 5.0 g/dL Final  . AST 08/01/2019 64* 15 - 41 U/L Final  . ALT 08/01/2019 74* 0 - 44 U/L Final  . Alkaline Phosphatase 08/01/2019 140* 38 - 126 U/L Final  . Total Bilirubin 08/01/2019 0.8  0.3 - 1.2 mg/dL Final  . GFR calc non Af Amer 08/01/2019 53* >60 mL/min Final  . GFR calc Af Amer 08/01/2019 >60  >60 mL/min Final  . Anion gap 08/01/2019 9  5 - 15 Final   Performed at [facility], [address] [place], [place] [address]    Assessment:  ROSHAWNA Bond is a 69 y.o. female withstage IAright breast cancers/plumpectomy on 02/06/2019. Pathology([id number]) revealed a1.2 cm grade I invasive carcinoma of no special type. Low grade DCIS was present. Closest margin was 4 mm. One of threesentinellymph nodes were positive for micro metastatic carcinoma(>0.2 to 2 mm). Margins are negative for atypia and malignancy.Tumor was ER positive (>90%) PR positive (51-90%) and HER-2/neu negative (1+).Pathologic stagewas pT1c pN52m.  Oncotype DX testingon 03/09/2019 revealed a recurrence score of 16 with a distant recurrence risk at 9 years of 15% (95% CI 10-19%) and no apparent benefit of chemotherapy.   Bilateralscreening mammogramon 12/16/2018 revealed a mass in the right breast.There were no suspicious findings in  the left breast.Right diagnosticmammogramand ultrasoundon 01/17/2019 revealed a1 x 1.2 x 1.1 cm hypoechoicmass in the right breast at the 9:30 position, 7 cm from the nipple. There were indeterminate calcifications in the upper-outerquadrant of theright breast. Lymph nodes were unremarkable.  Shereceived right breastradiationfrom11/01/2019-05/15/2019.  She began Femara on 06/29/2019.  She hassarcoidosiss/p utrasound guided supraclavicular lymph node biopsy on 03/11/2016. Pathologyrevealed non-necrotizing granulomatousinflammation with fibrosis. There was no evidence of malignancy. GMS-fungal, AFB and Gram stainswerenegative.She presented withsmall upper abdominal adenopathy,bibasilar lung nodes, a30 pound weight loss,some drenching night sweats, and mild hypercalcemia.   Abdominal and pelvic CTscan on 02/26/2016 revealed multiple new bibasilar lung nodules measuring up to 5 mm in size. There was new para-aortic and gastrohepatic lymph nodes measuring up to 1 cm in size, suspicious for metastatic disease. Abdomen and pelvis CT on 06/28/2019 revealed no acute abdominal/pelvic findings, mass lesions or adenopathy.  There was cholelithiasis and s/p nephrectomy with no findings for recurrent tumor or metastatic disease.  There was a small bulging midline pelvic hernia containing part of the dome of the bladder. There were stable small basilar pulmonary nodules.  She has a history ofmultiple angiomyolipomass/p left nephrectomy on 03/15/2006. Baseline creatinineis 0.9 - 1.1 (CrCl 50-63 ml/min).  Bone densityon 02/22/2019 was normal with a T-score of -0.5 at the right femur neck.   Colonoscopyin 2013 revealed polyps (next scheduled 2018). She had a partial hysterectomy at age 69  Symptomatically, she is doing well.  She has some trapped moisture under her breasts.  She has a blister on her left breast.  WBC 3600 ([id number]).  Plan: 1.   Labs today:  CBC with  diff, CMP, folate. 2.Stage IA right breast cancer She is s/plumpectomyand SLN biopsyon 02/06/2019. Pathologic stage was T1cN186m(stage IA) OncoType DX testingrevealed a recurrence score of 16. No benefit of chemotherapy. Patient s/p right breastradiation(completed on 05/15/2019). She began Femara on 06/29/2019.  She is tolerating it well.  Rx:  Femara 90 day supply with 3 refills with [employer]. 3.Elevated LFTs             AST 64.  ALT 74.  Alkaline phosphatase 140.  Patient has midly elevated LFTs.             Hepatitis B and C serologies were negative.             Review abdomen and pelvis CT from on 06/28/2019.  Images personally reviewed.  Agree with radiology interpretation.   No evidence of liver lesions or recurrent disease.                         Patient in agreement.  Avoid contrast secondary to one kidney.  RN to contact GI [facility] re: follow-up. 4.   Right arm lmphedema   Referral to lymphedema clinic. 5.   Moisture trapped under breasts  Discuss keeping area clean and dry.  Patient at risk for Candida infection.  Rx:  Nystatin powder. 6.   Mild leukopenia  Patient on MTX.  Check folate level. 7.   RTC in 3 months for MD assessment and labs (CBC with diff, CMP, CA27.29).  I discussed the assessment and treatment plan with the patient.  The patient was provided an opportunity to ask questions and all were answered.  The patient agreed with the plan and demonstrated an understanding of the instructions.  The patient was advised to call back if the symptoms worsen or if the condition fails to improve as anticipated.   Lequita Asal, MD, PhD    08/01/2019, 9:23 AM  I, Selena Batten, am acting as scribe for [employer]. Mike Gip, MD, PhD.  I, Anas Reister C. Mike Gip, MD, have reviewed the above documentation for accuracy and completeness, and I agree with the above.

## 2019-07-31 ENCOUNTER — Other Ambulatory Visit: Payer: Medicare HMO

## 2019-07-31 ENCOUNTER — Ambulatory Visit: Payer: Medicare HMO | Admitting: Hematology and Oncology

## 2019-07-31 ENCOUNTER — Telehealth: Payer: Self-pay

## 2019-07-31 NOTE — Telephone Encounter (Signed)
New patient referral was faxed  to Dr Alice Reichert office [phone]. Fax conformation received.

## 2019-08-01 ENCOUNTER — Other Ambulatory Visit: Payer: Self-pay

## 2019-08-01 ENCOUNTER — Inpatient Hospital Stay (HOSPITAL_BASED_OUTPATIENT_CLINIC_OR_DEPARTMENT_OTHER): Payer: Medicare HMO | Admitting: Hematology and Oncology

## 2019-08-01 ENCOUNTER — Encounter: Payer: Self-pay | Admitting: Hematology and Oncology

## 2019-08-01 ENCOUNTER — Telehealth: Payer: Self-pay

## 2019-08-01 ENCOUNTER — Inpatient Hospital Stay: Payer: Medicare HMO

## 2019-08-01 VITALS — BP 154/85 | HR 86 | Temp 96.3°F | Resp 18 | Wt 233.1 lb

## 2019-08-01 DIAGNOSIS — Z170 Estrogen receptor positive status [ER+]: Secondary | ICD-10-CM

## 2019-08-01 DIAGNOSIS — C50411 Malignant neoplasm of upper-outer quadrant of right female breast: Secondary | ICD-10-CM

## 2019-08-01 DIAGNOSIS — R7989 Other specified abnormal findings of blood chemistry: Secondary | ICD-10-CM | POA: Diagnosis not present

## 2019-08-01 DIAGNOSIS — I890 Lymphedema, not elsewhere classified: Secondary | ICD-10-CM

## 2019-08-01 LAB — CBC WITH DIFFERENTIAL/PLATELET
Abs Immature Granulocytes: 0 10*3/uL (ref 0.00–0.07)
Basophils Absolute: 0 10*3/uL (ref 0.0–0.1)
Basophils Relative: 1 %
Eosinophils Absolute: 0.3 10*3/uL (ref 0.0–0.5)
Eosinophils Relative: 8 %
HCT: 41.6 % (ref 36.0–46.0)
Hemoglobin: 13.4 g/dL (ref 12.0–15.0)
Immature Granulocytes: 0 %
Lymphocytes Relative: 13 %
Lymphs Abs: 0.5 10*3/uL — ABNORMAL LOW (ref 0.7–4.0)
MCH: 30.2 pg (ref 26.0–34.0)
MCHC: 32.2 g/dL (ref 30.0–36.0)
MCV: 93.7 fL (ref 80.0–100.0)
Monocytes Absolute: 0.6 10*3/uL (ref 0.1–1.0)
Monocytes Relative: 15 %
Neutro Abs: 2.3 10*3/uL (ref 1.7–7.7)
Neutrophils Relative %: 63 %
Platelets: 166 10*3/uL (ref 150–400)
RBC: 4.44 MIL/uL (ref 3.87–5.11)
RDW: 14.2 % (ref 11.5–15.5)
WBC: 3.6 10*3/uL — ABNORMAL LOW (ref 4.0–10.5)
nRBC: 0 % (ref 0.0–0.2)

## 2019-08-01 LAB — COMPREHENSIVE METABOLIC PANEL
ALT: 74 U/L — ABNORMAL HIGH (ref 0–44)
AST: 64 U/L — ABNORMAL HIGH (ref 15–41)
Albumin: 3.7 g/dL (ref 3.5–5.0)
Alkaline Phosphatase: 140 U/L — ABNORMAL HIGH (ref 38–126)
Anion gap: 9 (ref 5–15)
BUN: 19 mg/dL (ref 8–23)
CO2: 26 mmol/L (ref 22–32)
Calcium: 9.6 mg/dL (ref 8.9–10.3)
Chloride: 103 mmol/L (ref 98–111)
Creatinine, Ser: 1.08 mg/dL — ABNORMAL HIGH (ref 0.44–1.00)
GFR calc Af Amer: >60 mL/min (ref >60)
GFR calc non Af Amer: 53 mL/min — ABNORMAL LOW (ref >60)
Glucose, Bld: 188 mg/dL — ABNORMAL HIGH (ref 70–99)
Potassium: 3.8 mmol/L (ref 3.5–5.1)
Sodium: 138 mmol/L (ref 135–145)
Total Bilirubin: 0.8 mg/dL (ref 0.3–1.2)
Total Protein: 6.7 g/dL (ref 6.5–8.1)

## 2019-08-01 LAB — FOLATE: Folate: 58 ng/mL (ref >5.9)

## 2019-08-01 MED ORDER — LETROZOLE 2.5 MG PO TABS: 2.5000 mg | ORAL_TABLET | Freq: Every day | ORAL | 3 refills | Status: DC

## 2019-08-01 MED ORDER — NYSTATIN 100000 UNIT/GM EX POWD: 1.0000 "application " | Freq: Three times a day (TID) | CUTANEOUS | 0 refills | Status: AC

## 2019-08-01 NOTE — Telephone Encounter (Signed)
Contacted Dr. Ricky Stabs office to request patient be seen sooner than 09/21/2019 d/t LFTs continuing to increase. Receptionist states she will make nurse aware and will look at schedule to see if patient can be moved to a sooner date. Labs faxed to office and fax confirmation received. Requested Dr. Ricky Stabs office to contact our office to inform us if date was moved sooner.

## 2019-08-01 NOTE — Progress Notes (Signed)
Patient here for follow up. Patient is c/o right arm edema x 2-3 weeks. Denies any other concerns.

## 2019-08-02 ENCOUNTER — Telehealth: Payer: Self-pay

## 2019-08-02 NOTE — Telephone Encounter (Signed)
Told patient about her recent folate results (58) per doctor corcoran. Patient also asked when her next appointment was, I reminded her that it is 10/31/19 @ 10:45. Pt verbalizes understanding.

## 2019-08-02 NOTE — Telephone Encounter (Signed)
Recived a call form GI Dr Alice Reichert office to inform the office that the patient has been scheduel for Wednesday 08/09/2019 @ 11:30 the office informed me that have made the patient aware of her appointment time and date.

## 2019-08-08 ENCOUNTER — Ambulatory Visit: Payer: Medicare HMO | Attending: Hematology and Oncology | Admitting: Occupational Therapy

## 2019-08-08 ENCOUNTER — Other Ambulatory Visit: Payer: Self-pay

## 2019-08-08 ENCOUNTER — Encounter: Payer: Self-pay | Admitting: Occupational Therapy

## 2019-08-08 DIAGNOSIS — I890 Lymphedema, not elsewhere classified: Secondary | ICD-10-CM | POA: Diagnosis not present

## 2019-08-08 DIAGNOSIS — M6281 Muscle weakness (generalized): Secondary | ICD-10-CM | POA: Diagnosis not present

## 2019-08-09 DIAGNOSIS — N1832 Chronic kidney disease, stage 3b: Secondary | ICD-10-CM | POA: Diagnosis not present

## 2019-08-09 DIAGNOSIS — R748 Abnormal levels of other serum enzymes: Secondary | ICD-10-CM | POA: Diagnosis not present

## 2019-08-09 DIAGNOSIS — D869 Sarcoidosis, unspecified: Secondary | ICD-10-CM | POA: Diagnosis not present

## 2019-08-09 DIAGNOSIS — Z170 Estrogen receptor positive status [ER+]: Secondary | ICD-10-CM | POA: Diagnosis not present

## 2019-08-09 DIAGNOSIS — C50411 Malignant neoplasm of upper-outer quadrant of right female breast: Secondary | ICD-10-CM | POA: Diagnosis not present

## 2019-08-09 DIAGNOSIS — Z800 Family history of malignant neoplasm of digestive organs: Secondary | ICD-10-CM | POA: Diagnosis not present

## 2019-08-09 NOTE — Therapy (Signed)
[facility] [address] [place], [address] Phone: [phone]   Fax:  [phone]  Occupational Therapy Evaluation  Patient Details  Name: Jill Bond MRN: [id number] Date of Birth: 1951/02/09 No data recorded  Encounter Date: 08/08/2019  OT End of Session - 08/09/19 1737    Visit Number  1    Number of Visits  1    OT Start Time  0915    OT Stop Time  1015    OT Time Calculation (min)  60 min    Activity Tolerance  Patient tolerated treatment well    Behavior During Therapy  [facility] for tasks assessed/performed       Past Medical History:  Diagnosis Date  . Anxiety   . Arthritis   . Breast cancer ([place]) 01/2019   invasive mammary carcinoma   . Chronic kidney disease 2006   Left Nephrectomy  . Complication of anesthesia    PONV  . Constipation   . Depression   . GERD (gastroesophageal reflux disease)   . Headache   . Hypertension   . PONV (postoperative nausea and vomiting)   . Rotator cuff disorder    LEFT  . Sarcoidosis   . Shortness of breath dyspnea    with exertion  . Sleep apnea     Past Surgical History:  Procedure Laterality Date  . ABDOMINAL HYSTERECTOMY     PARTIAL  . ACHILLES TENDON SURGERY Left 02/22/2015   Procedure: Secondary ACHILLES TENDON REPAIR;  Surgeon: Albertine Patricia, DPM;  Location: [facility] ORS;  Service: Podiatry;  Laterality: Left;  . Bladder tac    . BREAST BIOPSY Right 01/23/2019   affirm stereo bx of calcs with x marker, ductal hyperplasia  . BREAST BIOPSY Right 01/23/2019   [place] bx of mass with coil marker, invasive mammary carcinoma  . BREAST EXCISIONAL BIOPSY Left 2003   neg  . BREAST LUMPECTOMY Right 02/06/2019  . COLONOSCOPY  2013  . JOINT REPLACEMENT Left 2007   Total Knee Replacement  . KIDNEY CYST REMOVAL Left   . OSTECTOMY Left 02/22/2015   Procedure: OSTECTOMY;  Surgeon: Albertine Patricia, DPM;  Location: [facility] ORS;  Service: Podiatry;  Laterality:  Left;  . PARTIAL MASTECTOMY WITH NEEDLE LOCALIZATION AND AXILLARY SENTINEL LYMPH NODE BX Right 02/06/2019   Procedure: PARTIAL MASTECTOMY WITH NEEDLE LOCALIZATION AND AXILLARY SENTINEL LYMPH NODE BX;  Surgeon: Herbert Pun, MD;  Location: [facility] ORS;  Service: General;  Laterality: Right;  . SHOULDER ARTHROSCOPY W/ ROTATOR CUFF REPAIR Left YRS AGO  . TONSILLECTOMY  AGE 21  . TOTAL KNEE ARTHROPLASTY Right 10/22/2015   Procedure: RIGHT TOTAL KNEE ARTHROPLASTY;  Surgeon: Paralee Cancel, MD;  Location: [facility] ORS;  Service: Orthopedics;  Laterality: Right;  . TOTAL KNEE ARTHROPLASTY Right    10/22/2015    There were no vitals filed for this visit.     [facility] OT Assessment - 08/09/19 1753      Assessment   Medical Diagnosis  right breast cancer    Hand Dominance  Right      Home  Environment   Family/patient expects to be discharged to:  Private residence    Living Arrangements  Alone    Type of [place]  One level    Lives With  Alone      Prior Function   Level of [place]  Retired    Leisure  loves to do yard work, likes to camp      ADL   ADL comments  Patient remains independent with self care and IADls, daughter in law helps with mopping.        IADL   Prior Level of Function Shopping  independent    Shopping  Shops independently for small purchases    Prior Level of Function Light Housekeeping  independent    Light Housekeeping  Performs light daily tasks such as dishwashing, bed making;Maintains house alone or with occasional assistance    Prior Level of Function Meal Prep  independent    Meal Prep  Plans, prepares and serves adequate meals independently    Prior Level of Function Community Mobility  independent      Hand Function   Right Hand Grip (lbs)  40    Right Hand Lateral Pinch  14 lbs    Right Hand 3 Point Pinch  15 lbs    Left Hand Grip (lbs)  42    Left Hand Lateral Pinch  14 lbs    Left 3 point pinch  14 lbs        LYMPHEDEMA/ONCOLOGY QUESTIONNAIRE - 08/09/19 1754      Treatment   Active Chemotherapy Treatment  --   patient reports chemo pill   Active Radiation Treatment  No    Past Radiation Treatment  Yes    Date  03/20/19      Right Upper Extremity Lymphedema   At [place]   46.4 cm    15 cm Proximal to Olecranon Process  48 cm    10 cm Proximal to Olecranon Process  44.4 cm    Olecranon Process  30 cm    15 cm Proximal to Ulnar Styloid Process  30.5 cm    10 cm Proximal to Ulnar Styloid Process  26.3 cm    Just Proximal to Ulnar Styloid Process  17 cm    Across Hand at [employer]  20 cm    At [place] of 2nd Digit  6.4 cm    At [facility] of Thumb  6.6 cm      Left Upper Extremity Lymphedema   At [place]   46.1 cm    15 cm Proximal to Olecranon Process  48.3 cm    10 cm Proximal to Olecranon Process  45.5 cm    Olecranon Process  31 cm    15 cm Proximal to Ulnar Styloid Process  30.4 cm    10 cm Proximal to Ulnar Styloid Process  26.4 cm    Just Proximal to Ulnar Styloid Process  16.6 cm    Across Hand at [employer]  20 cm    At [place] of 2nd Digit  6.5 cm    At [facility] of Thumb  6.7 cm        Patient is a 69 yo female s/p partial mastectomy, Sept 2009.  She reported recent edema in right arm with pain at scar site from breast surgery, patient reported hard area with pain and edema but also states this past weekend she felt a "pop" like something burst and then had clear drainage from the right nipple.  She reports after that, she felt much better.  No additional draining from nipple this date.  Circumferential measurements taken with comparison and patient with no significant edema in right compared to left.  Patient does has some limitations in R shoulder ROM and was issued and instructed on exercises at  home.    Recommend patient follow up with MD, especially if she has any recurrent nipple drainage.   Therapeutic Exercise: Patient issued HEP for right shoulder ROM with  medbridge code  [id number]   Shoulder flexion 104 flex/abd combination movement on right, left 130 degrees. Strength 4/5 overall RUE, left UE 5/5 Full fisting and opposition   OT Education - 08/09/19 1737    Education Details  ROM exercises for shoulder    Person(s) Educated  Patient    Methods  Explanation;Demonstration;Handout    Comprehension  Verbalized understanding;Returned demonstration          OT Long Term Goals - 08/09/19 1739      OT LONG TERM GOAL #1   Title  Patient will demonstrate understanding and independence in home exercise program for UE.    Baseline  no current program    Time  1    Period  Days    Status  Achieved            Plan - 08/09/19 1738    Clinical Impression Statement  Patient is a 69 yo female s/p partial mastectomy, Sept 2009.  She reported recent edema in right arm with pain at scar site from breast surgery, patient reported hard area with pain and edema but also states this past weekend she felt a "pop" like something burst and then had clear drainage from the right nipple.  She reports after that, she felt much better.  No additional draining from nipple this date.  Circumferential measurements taken with comparison and patient with no significant edema in right compared to left.  Patient does has some limitations in R shoulder ROM and was issued and instructed on exercises at home.  She has no additional needs at this time however, if future needs arise, please reconsult.    OT Occupational Profile and History  Detailed Assessment- Review of Records and additional review of physical, cognitive, psychosocial history related to current functional performance    Occupational performance deficits (Please refer to evaluation for details):  ADL's;IADL's    Body Structure / Function / Physical Skills  Pain;ROM    Rehab Potential  Good    Clinical Decision Making  Limited treatment options, no task modification necessary    Modification or  Assistance to Complete Evaluation   No modification of tasks or assist necessary to complete eval    OT Frequency  One time visit    OT Treatment/Interventions  Therapeutic exercise    OT Home Exercise Plan  Issued HEP for shoulder ROM with wall slides    Consulted and Agree with Plan of Care  Patient       Patient will benefit from skilled therapeutic intervention in order to improve the following deficits and impairments:   Body Structure / Function / Physical Skills: Pain, ROM       Visit Diagnosis: Muscle weakness (generalized)    Problem List Patient Active Problem List   Diagnosis Date Noted  . Elevated LFTs 06/22/2019  . Malignant neoplasm of upper-outer quadrant of right breast in female, estrogen receptor positive ([place]) 01/31/2019  . CKD (chronic kidney disease) stage 3, GFR 30-59 ml/min 05/21/2017  . Nausea without vomiting 03/13/2016  . Palliative care encounter   . Goals of care, counseling/discussion   . Acute renal failure ([place]) 03/11/2016  . Constipation 03/11/2016  . S/P lymph node biopsy 03/11/2016  . Non-caseating granuloma 03/11/2016  . Lung nodules 03/09/2016  . Adenopathy 03/09/2016  . Weight  loss 03/09/2016  . Hypercalcemia 03/09/2016  . Obese 10/23/2015  . S/P right TKA 10/22/2015  . Presence of right artificial knee joint 10/22/2015  . Cancer of kidney ([place]) 11/06/2014  . Benign essential hypertension 11/01/2013  . Hypercholesterolemia 11/01/2013  . Spinal stenosis 11/01/2013  . Angiomyolipoma of left kidney 03/15/2006   Sung Parodi T Kura Bethards, OTR/L, CLT  Tyheim Vanalstyne 08/09/2019, 5:55 PM  [facility] [address] [place], [address] Phone: [phone]   Fax:  [phone]  Name: Jill Bond MRN: [id number] Date of Birth: 1950-06-09

## 2019-08-15 ENCOUNTER — Other Ambulatory Visit: Payer: Self-pay | Admitting: Student

## 2019-08-15 DIAGNOSIS — R748 Abnormal levels of other serum enzymes: Secondary | ICD-10-CM

## 2019-08-18 ENCOUNTER — Ambulatory Visit
Admission: RE | Admit: 2019-08-18 | Discharge: 2019-08-18 | Disposition: A | Discharge status: Home / Self Care | Payer: Medicare HMO | Source: Ambulatory Visit | Attending: Student | Admitting: Student

## 2019-08-18 ENCOUNTER — Other Ambulatory Visit: Payer: Self-pay

## 2019-08-18 DIAGNOSIS — K8020 Calculus of gallbladder without cholecystitis without obstruction: Secondary | ICD-10-CM | POA: Diagnosis not present

## 2019-08-18 DIAGNOSIS — R748 Abnormal levels of other serum enzymes: Secondary | ICD-10-CM | POA: Insufficient documentation

## 2019-08-21 ENCOUNTER — Other Ambulatory Visit: Payer: Self-pay | Admitting: Hematology and Oncology

## 2019-08-21 DIAGNOSIS — C50411 Malignant neoplasm of upper-outer quadrant of right female breast: Secondary | ICD-10-CM

## 2019-09-12 DIAGNOSIS — R300 Dysuria: Secondary | ICD-10-CM | POA: Diagnosis not present

## 2019-09-12 DIAGNOSIS — R748 Abnormal levels of other serum enzymes: Secondary | ICD-10-CM | POA: Diagnosis not present

## 2019-09-12 DIAGNOSIS — K760 Fatty (change of) liver, not elsewhere classified: Secondary | ICD-10-CM | POA: Diagnosis not present

## 2019-09-13 DIAGNOSIS — G4733 Obstructive sleep apnea (adult) (pediatric): Secondary | ICD-10-CM | POA: Diagnosis not present

## 2019-09-18 DIAGNOSIS — D869 Sarcoidosis, unspecified: Secondary | ICD-10-CM | POA: Diagnosis not present

## 2019-09-26 DIAGNOSIS — L821 Other seborrheic keratosis: Secondary | ICD-10-CM | POA: Diagnosis not present

## 2019-09-26 DIAGNOSIS — D492 Neoplasm of unspecified behavior of bone, soft tissue, and skin: Secondary | ICD-10-CM | POA: Diagnosis not present

## 2019-09-26 DIAGNOSIS — Z853 Personal history of malignant neoplasm of breast: Secondary | ICD-10-CM | POA: Diagnosis not present

## 2019-10-03 ENCOUNTER — Other Ambulatory Visit: Payer: Self-pay | Admitting: General Surgery

## 2019-10-03 DIAGNOSIS — N6452 Nipple discharge: Secondary | ICD-10-CM | POA: Diagnosis not present

## 2019-10-03 DIAGNOSIS — C50411 Malignant neoplasm of upper-outer quadrant of right female breast: Secondary | ICD-10-CM | POA: Diagnosis not present

## 2019-10-06 ENCOUNTER — Ambulatory Visit
Admission: RE | Admit: 2019-10-06 | Discharge: 2019-10-06 | Disposition: A | Discharge status: Home / Self Care | Payer: Medicare HMO | Source: Ambulatory Visit | Attending: General Surgery | Admitting: General Surgery

## 2019-10-06 DIAGNOSIS — Z853 Personal history of malignant neoplasm of breast: Secondary | ICD-10-CM | POA: Diagnosis not present

## 2019-10-06 DIAGNOSIS — N6452 Nipple discharge: Secondary | ICD-10-CM | POA: Diagnosis not present

## 2019-10-06 DIAGNOSIS — R928 Other abnormal and inconclusive findings on diagnostic imaging of breast: Secondary | ICD-10-CM | POA: Diagnosis not present

## 2019-10-06 HISTORY — DX: Personal history of irradiation: Z92.3

## 2019-10-13 ENCOUNTER — Other Ambulatory Visit: Payer: Self-pay | Admitting: General Surgery

## 2019-10-13 DIAGNOSIS — N6452 Nipple discharge: Secondary | ICD-10-CM

## 2019-10-13 DIAGNOSIS — D860 Sarcoidosis of lung: Secondary | ICD-10-CM | POA: Diagnosis not present

## 2019-10-29 DIAGNOSIS — I890 Lymphedema, not elsewhere classified: Secondary | ICD-10-CM | POA: Insufficient documentation

## 2019-10-29 NOTE — Progress Notes (Signed)
[facility]  [address] [place], [place] [address] Phone: [phone]  Fax: [phone]   Clinic Day:  10/31/2019  Referring physician: Baxter Hire, MD  Chief Complaint: Jill Bond is a 69 y.o. female with sarcoid andstage IAright breastcancer who is seen for a 3 month assessment.  HPI: The patient was last seen in the medical oncology clinic on 08/01/2019. At that time, she was doing well. She had some trapped moisture under her breasts and a blister on her left breast. Hematocrit was 41.6, hemoglobin 13.4, platelets 166,000, WBC 3,600 (ANC 2,300). Folate was 58.0. Creatinine was 1.08. AST was 64 and ALT 74. Alkaline phosphatase was 140.  She was to follow-up in the GI [facility].  The patient was seen by Effie Berkshire, PA at in the [facility] on 08/09/2019 for elevated liver enzymes.  Serologic work-up was negative.  She was felt to have hepatic steatosis.  Abdominal ultrasound on 08/18/2019 revealed cholelithiasis (largest gallstone 1 cm) and sludge in gallbladder. There was no gallbladder wall thickening or pericholecystic fluid. There was also diffuse increase in liver echogenicity, a finding indicative of hepatic steatosis. No focal liver lesions were evident by ultrasound. The left kidney was surgically absent.  The patient saw Dr. Lanney Gins, pulmonology on 09/12/2019. She was diagnosed with a Klebsiella pneumoniae UTI.  She was prescribed ciprofloxacin.  The patient saw Dr. Windell Moment at [facility] on 10/03/2019. She reported intermittent clear/yellow right nipple discharge for the last few months, accompanied by right breast tenderness.   Right diagnostic mammogram and ultrasound on 10/06/2019 revealed no suspicious abnormality was identified within the right breast.  There was indeterminate spontaneous clear and sometimes yellowish discharge from the right nipple. While this may be secondary to the seroma within the  upper-outer right breast, no discrete connection from the seroma to the nipple was identified with ultrasound. Bilateral breast MRI was recommended for further evaluation.  During the interim, she has been tired. She is still having nipple discharge and has an MRI scheduled for tomorrow. She reports leg spasms when she tries to sleep and dry mouth. The patient has changed her diet and no longer eats processed bread or drinks alcohol. She uses a CPAP machine and sleeps for about 6 hours each night. She is taking letrozole as prescribed and has no side effects. She performs breast self-exams monthly.   Past Medical History:  Diagnosis Date  . Anxiety   . Arthritis   . Breast cancer ([place]) 01/2019   invasive mammary carcinoma   . Chronic kidney disease 2006   Left Nephrectomy  . Complication of anesthesia    PONV  . Constipation   . Depression   . GERD (gastroesophageal reflux disease)   . Headache   . Hypertension   . Personal history of radiation therapy   . PONV (postoperative nausea and vomiting)   . Rotator cuff disorder    LEFT  . Sarcoidosis   . Shortness of breath dyspnea    with exertion  . Sleep apnea     Past Surgical History:  Procedure Laterality Date  . ABDOMINAL HYSTERECTOMY     PARTIAL  . ACHILLES TENDON SURGERY Left 02/22/2015   Procedure: Secondary ACHILLES TENDON REPAIR;  Surgeon: Albertine Patricia, DPM;  Location: [facility] ORS;  Service: Podiatry;  Laterality: Left;  . Bladder tac    . BREAST BIOPSY Right 01/23/2019   affirm stereo bx of calcs with x marker, ductal hyperplasia  . BREAST  BIOPSY Right 01/23/2019   [place] bx of mass with coil marker, invasive mammary carcinoma  . BREAST EXCISIONAL BIOPSY Left 2003   neg  . BREAST LUMPECTOMY Right 02/06/2019  . COLONOSCOPY  2013  . JOINT REPLACEMENT Left 2007   Total Knee Replacement  . KIDNEY CYST REMOVAL Left   . OSTECTOMY Left 02/22/2015   Procedure: OSTECTOMY;  Surgeon: Albertine Patricia, DPM;  Location: [facility] ORS;   Service: Podiatry;  Laterality: Left;  . PARTIAL MASTECTOMY WITH NEEDLE LOCALIZATION AND AXILLARY SENTINEL LYMPH NODE BX Right 02/06/2019   Procedure: PARTIAL MASTECTOMY WITH NEEDLE LOCALIZATION AND AXILLARY SENTINEL LYMPH NODE BX;  Surgeon: Herbert Pun, MD;  Location: [facility] ORS;  Service: General;  Laterality: Right;  . SHOULDER ARTHROSCOPY W/ ROTATOR CUFF REPAIR Left YRS AGO  . TONSILLECTOMY  AGE 42  . TOTAL KNEE ARTHROPLASTY Right 10/22/2015   Procedure: RIGHT TOTAL KNEE ARTHROPLASTY;  Surgeon: Paralee Cancel, MD;  Location: [facility] ORS;  Service: Orthopedics;  Laterality: Right;  . TOTAL KNEE ARTHROPLASTY Right    10/22/2015    Family History  Problem Relation Age of Onset  . Breast cancer Mother 78  . Heart failure Father   . Prostate cancer Father     Social History:  reports that she has never smoked. She has never used smokeless tobacco. She reports that she does not drink alcohol and does not use drugs. She denies any exposure to radiation or toxins. 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 Her husband died on 08-Sep-2013.Her first cousinis [name]([phone]).Her daughter, Pat,is her medical power of attorney. The patient is alone today.  Allergies:  Allergies  Allergen Reactions  . Codeine Nausea And Vomiting  . Penicillins Hives and Other (See Comments)    Has patient had a PCN reaction causing immediate rash, facial/tongue/throat swelling, SOB or lightheadedness with hypotension: no Has patient had a PCN reaction causing severe rash involving mucus membranes or skin necrosis: no Has patient had a PCN reaction that required hospitalization no Has patient had a PCN reaction occurring within the last 10 years: no If all of the above answers are "NO", then may proceed with Cephalosporin use.     Current Medications: Current Outpatient Medications  Medication Sig Dispense Refill  . amLODipine (NORVASC) 10 MG tablet Take 10 mg by mouth at bedtime.     . cyclobenzaprine (FLEXERIL) 10 MG tablet Take 1 tablet by mouth as needed.    . docusate sodium (COLACE) 100 MG capsule Take 250 mg by mouth at bedtime.     Marland Kitchen letrozole (FEMARA) 2.5 MG tablet TAKE 1 TABLET BY MOUTH EVERY DAY 30 tablet 1  . methotrexate (RHEUMATREX) 2.5 MG tablet TAKE 4 TABLETS (10 MG TOTAL) BY MOUTH ONCE A WEEK. CAUTION:CHEMOTHERAPY. PROTECT FROM LIGHT. 52 tablet 2  . metoprolol succinate (TOPROL-XL) 100 MG 24 hr tablet Take 100 mg by mouth at bedtime.     . Multiple Vitamin (MULTIVITAMIN WITH MINERALS) TABS tablet Take 0.5 tablets by mouth daily.     Marland Kitchen nystatin (MYCOSTATIN/NYSTOP) powder Apply 1 application topically 3 (three) times daily. 15 g 0  . omeprazole (PRILOSEC) 20 MG capsule Take 20 mg by mouth daily.     . diclofenac sodium (VOLTAREN) 1 % GEL Apply 1 application topically 2 (two) times daily as needed (foot pain). (Patient not taking: Reported on 10/31/2019)     No current facility-administered medications for this visit.    Review of Systems  Constitutional: Positive for malaise/fatigue and weight  loss (9 lbs). Negative for chills, diaphoresis and fever.       Doing well.   HENT: Negative for congestion, ear discharge, ear pain, hearing loss, nosebleeds, sinus pain and sore throat.        Chronic rhinorrhea. Dry mouth  Eyes: Negative for blurred vision.  Respiratory: Negative for cough, hemoptysis, sputum production and shortness of breath.        Sleep apnea on CPAP at night.  Cardiovascular: Positive for leg swelling. Negative for chest pain and palpitations.  Gastrointestinal: Negative.  Negative for abdominal pain, blood in stool, constipation, diarrhea, heartburn, melena, nausea and vomiting.  Genitourinary: Negative.  Negative for dysuria, frequency, hematuria and urgency.  Musculoskeletal: Negative for back pain (chronic), joint pain, myalgias and neck pain.       Leg spasms at night.  Skin: Negative for itching and rash.       Nipple discharge.    Neurological: Negative.  Negative for dizziness, tingling, sensory change, weakness and headaches.  Endo/Heme/Allergies: Negative.  Does not bruise/bleed easily.  Psychiatric/Behavioral: Positive for memory loss. Negative for depression. The patient is not nervous/anxious and does not have insomnia.   All other systems reviewed and are negative.   Performance status (ECOG): 1  Vitals Blood pressure (!) 145/82, pulse 66, temperature (!) 96.2 F (35.7 C), temperature source Tympanic, resp. rate 19, weight 224 lb 13.9 oz (102 kg), SpO2 100 %.   Physical Exam Vitals and nursing note reviewed.  Constitutional:      General: She is not in acute distress.    Appearance: She is well-developed. She is not diaphoretic.  HENT:     Head: Normocephalic and atraumatic.     Mouth/Throat:     Mouth: Mucous membranes are dry.     Pharynx: Oropharynx is clear. No oropharyngeal exudate.  Eyes:     General: No scleral icterus.    Conjunctiva/sclera: Conjunctivae normal.     Pupils: Pupils are equal, round, and reactive to light.     Comments: Glasses. Blue eyes.  Cardiovascular:     Rate and Rhythm: Normal rate and regular rhythm.     Pulses: Normal pulses.     Heart sounds: Normal heart sounds. No murmur heard.   Pulmonary:     Effort: Pulmonary effort is normal. No respiratory distress.     Breath sounds: Normal breath sounds. No wheezing or rales.  Chest:     Breasts:        Right: Swelling (lower right breast, s/p radiation) and tenderness present. No inverted nipple, mass, nipple discharge or skin change.        Left: Tenderness present. No inverted nipple, mass, nipple discharge or skin change.  Abdominal:     General: Bowel sounds are normal. There is no distension.     Palpations: Abdomen is soft. There is no mass.     Tenderness: There is no abdominal tenderness. There is no guarding or rebound.  Musculoskeletal:        General: Normal range of motion.     Right upper arm:  Tenderness present.     Cervical back: Normal range of motion and neck supple.     Right lower leg: Edema (trace) present.     Left lower leg: Edema (trace) present.  Lymphadenopathy:     Head:     Right side of head: No preauricular, posterior auricular or occipital adenopathy.     Left side of head: No preauricular, posterior auricular or occipital adenopathy.  Cervical: No cervical adenopathy.     Upper Body:     Right upper body: No supraclavicular or axillary adenopathy.     Left upper body: No supraclavicular or axillary adenopathy.     Lower Body: No right inguinal adenopathy. No left inguinal adenopathy.  Skin:    General: Skin is warm and dry.  Neurological:     Mental Status: She is alert and oriented to person, place, and time.  Psychiatric:        Mood and Affect: Mood normal.        Behavior: Behavior normal.        Thought Content: Thought content normal.        Judgment: Judgment normal.    Appointment on 10/31/2019  Component Date Value Ref Range Status  . Sodium 10/31/2019 139  135 - 145 mmol/L Final  . Potassium 10/31/2019 4.0  3.5 - 5.1 mmol/L Final  . Chloride 10/31/2019 103  98 - 111 mmol/L Final  . CO2 10/31/2019 29  22 - 32 mmol/L Final  . Glucose, Bld 10/31/2019 110* 70 - 99 mg/dL Final   Glucose reference range applies only to samples taken after fasting for at least 8 hours.  . BUN 10/31/2019 16  8 - 23 mg/dL Final  . Creatinine, Ser 10/31/2019 0.96  0.44 - 1.00 mg/dL Final  . Calcium 10/31/2019 9.7  8.9 - 10.3 mg/dL Final  . Total Protein 10/31/2019 7.1  6.5 - 8.1 g/dL Final  . Albumin 10/31/2019 4.0  3.5 - 5.0 g/dL Final  . AST 10/31/2019 50* 15 - 41 U/L Final  . ALT 10/31/2019 62* 0 - 44 U/L Final  . Alkaline Phosphatase 10/31/2019 120  38 - 126 U/L Final  . Total Bilirubin 10/31/2019 1.1  0.3 - 1.2 mg/dL Final  . GFR calc non Af Amer 10/31/2019 >60  >60 mL/min Final  . GFR calc Af Amer 10/31/2019 >60  >60 mL/min Final  . Anion gap 10/31/2019  7  5 - 15 Final   Performed at [facility] [place], [address] [place], [place] [address]  . WBC 10/31/2019 4.4  4.0 - 10.5 K/uL Final  . RBC 10/31/2019 4.13  3.87 - 5.11 MIL/uL Final  . Hemoglobin 10/31/2019 13.1  12.0 - 15.0 g/dL Final  . HCT 10/31/2019 39.4  36 - 46 % Final  . MCV 10/31/2019 95.4  80.0 - 100.0 fL Final  . MCH 10/31/2019 31.7  26.0 - 34.0 pg Final  . MCHC 10/31/2019 33.2  30.0 - 36.0 g/dL Final  . RDW 10/31/2019 14.8  11.5 - 15.5 % Final  . Platelets 10/31/2019 161  150 - 400 K/uL Final  . nRBC 10/31/2019 0.0  0.0 - 0.2 % Final  . Neutrophils Relative % 10/31/2019 65  % Final  . Neutro Abs 10/31/2019 3.0  1.7 - 7.7 K/uL Final  . Lymphocytes Relative 10/31/2019 14  % Final  . Lymphs Abs 10/31/2019 0.6* 0.7 - 4.0 K/uL Final  . Monocytes Relative 10/31/2019 12  % Final  . Monocytes Absolute 10/31/2019 0.5  0 - 1 K/uL Final  . Eosinophils Relative 10/31/2019 7  % Final  . Eosinophils Absolute 10/31/2019 0.3  0 - 0 K/uL Final  . Basophils Relative 10/31/2019 1  % Final  . Basophils Absolute 10/31/2019 0.0  0 - 0 K/uL Final  . Immature Granulocytes 10/31/2019 1  % Final  . Abs Immature Granulocytes 10/31/2019 0.02  0.00 - 0.07 K/uL Final  Performed at [facility], [address] [place], [place] [address]    Assessment:  Jill Bond is a 69 y.o. female withstage IAright breast cancers/plumpectomy on 02/06/2019. Pathology([id number]) revealed a1.2 cm grade I invasive carcinoma of no special type. Low grade DCIS was present. Closest margin was 4 mm. One of threesentinellymph nodes were positive for micro metastatic carcinoma(>0.2 to 2 mm). Margins are negative for atypia and malignancy.Tumor was ER positive (>90%) PR positive (51-90%) and HER-2/neu negative (1+).Pathologic stagewas pT1c pN50m.  Oncotype DX testingon 03/09/2019 revealed a recurrence score of 16 with a distant recurrence risk at 9 years of 15% (95% CI  10-19%) and no apparent benefit of chemotherapy.   Bilateralscreening mammogramon 12/16/2018 revealed a mass in the right breast.There were no suspicious findings in the left breast.Right diagnosticmammogramand ultrasoundon 01/17/2019 revealed a1 x 1.2 x 1.1 cm hypoechoicmass in the right breast at the 9:30 position, 7 cm from the nipple. There were indeterminate calcifications in the upper-outerquadrant of theright breast. Lymph nodes were unremarkable.  Shereceived right breastradiationfrom11/01/2019-05/15/2019.  She began Femara on 06/29/2019.  Right diagnostic mammogram and ultrasound on 10/06/2019 revealed no suspicious abnormality was identified within the right breast.  There was indeterminate spontaneous clear and sometimes yellowish discharge from the right nipple. While this may be secondary to the seroma within the upper-outer right breast, no discrete connection from the seroma to the nipple was identified with ultrasound.   CA27.29 has been followed: 27.8 on 01/31/2019, 25.8 on 06/13/2019 and 20.4 on 10/31/2019.  She hassarcoidosiss/p utrasound guided supraclavicular lymph node biopsy on 03/11/2016. Pathologyrevealed non-necrotizing granulomatousinflammation with fibrosis. There was no evidence of malignancy. GMS-fungal, AFB and Gram stainswerenegative.She presented withsmall upper abdominal adenopathy,bibasilar lung nodes, a30 pound weight loss,some drenching night sweats, and mild hypercalcemia.   Abdominal and pelvic CTscan on 02/26/2016 revealed multiple new bibasilar lung nodules measuring up to 5 mm in size. There was new para-aortic and gastrohepatic lymph nodes measuring up to 1 cm in size, suspicious for metastatic disease. Abdomen and pelvis CT on 06/28/2019 revealed no acute abdominal/pelvic findings, mass lesions or adenopathy.  There was cholelithiasis and s/p nephrectomy with no findings for recurrent tumor or metastatic disease.   There was a small bulging midline pelvic hernia containing part of the dome of the bladder. There were stable small basilar pulmonary nodules.  She has a history ofmultiple angiomyolipomass/p left nephrectomy on 03/15/2006. Baseline creatinineis 0.9 - 1.1 (CrCl 50-63 ml/min).  Bone densityon 02/22/2019 was normal with a T-score of -0.5 at the right femur neck.   Patient has elevated LFTs.  Work-up by GI revealed hepatic steatosis.  Colonoscopyin 2013 revealed polyps (next scheduled 2018). She had a partial hysterectomy at age 69  Symptomatically, she notes right nipple discharge.  Exam reveals no palpable abnormality.  Plan: 1.   Labs today:  CBC with diff, CMP, CA27.29 2.Stage IA right breast cancer She is s/plumpectomyand SLN biopsyon 02/06/2019. Pathologic stage was T1cN162m(stage IA) OncoType DX testingrevealed a recurrence score of 16. No benefit of chemotherapy. Patient s/p right breastradiation(completed on 05/15/2019). She began Femara on 06/29/2019.   She continues to tolerate it well.  She has right nipple discharge.  Mammogram and ultrasound on 10/06/2019 reveal no etiology.  Breast MRI is scheduled for tomorrow. 3.Elevated LFTs             AST 64.  ALT 74.  Alkaline phosphatase 140 on 08/01/2019.  AST 50.  ALT 62.  Alkaline phosphatase 120 on 10/31/2019.  Hepatitis B and C serologies were negative.             Abdomen and pelvis CT on 06/28/2019.  Revealed no liver lesions or recurrent disease.  Review interval work-up at GI [facility].  Patient felt to have hepatic steatosis. 4.   Mild leukopenia, resolved  WBC 4400 (ANC 300). 5.   RTC in 3 months for MD assessment and labs (CBC with diff, CMP, CA 27-29).  I discussed the assessment and treatment plan with the patient.  The patient was provided an opportunity to ask questions and all were  answered.  The patient agreed with the plan and demonstrated an understanding of the instructions.  The patient was advised to call back if the symptoms worsen or if the condition fails to improve as anticipated.   Lequita Asal, MD, PhD    10/31/2019, 12:24 PM  I, Mirian Mo Tufford, am acting as Education administrator for [employer]. Mike Gip, MD, PhD.  I, Markevion Lattin C. Mike Gip, MD, have reviewed the above documentation for accuracy and completeness, and I agree with the above.

## 2019-10-31 ENCOUNTER — Other Ambulatory Visit: Payer: Self-pay

## 2019-10-31 ENCOUNTER — Inpatient Hospital Stay: Payer: Medicare HMO | Attending: Hematology and Oncology | Admitting: Hematology and Oncology

## 2019-10-31 ENCOUNTER — Inpatient Hospital Stay: Payer: Medicare HMO

## 2019-10-31 VITALS — BP 145/82 | HR 66 | Temp 96.2°F | Resp 19 | Wt 224.9 lb

## 2019-10-31 DIAGNOSIS — R7989 Other specified abnormal findings of blood chemistry: Secondary | ICD-10-CM | POA: Diagnosis not present

## 2019-10-31 DIAGNOSIS — Z170 Estrogen receptor positive status [ER+]: Secondary | ICD-10-CM | POA: Diagnosis not present

## 2019-10-31 DIAGNOSIS — Z803 Family history of malignant neoplasm of breast: Secondary | ICD-10-CM | POA: Diagnosis not present

## 2019-10-31 DIAGNOSIS — C50411 Malignant neoplasm of upper-outer quadrant of right female breast: Secondary | ICD-10-CM

## 2019-10-31 DIAGNOSIS — D869 Sarcoidosis, unspecified: Secondary | ICD-10-CM | POA: Diagnosis not present

## 2019-10-31 DIAGNOSIS — Z96653 Presence of artificial knee joint, bilateral: Secondary | ICD-10-CM | POA: Insufficient documentation

## 2019-10-31 DIAGNOSIS — C50911 Malignant neoplasm of unspecified site of right female breast: Secondary | ICD-10-CM | POA: Diagnosis not present

## 2019-10-31 DIAGNOSIS — G4730 Sleep apnea, unspecified: Secondary | ICD-10-CM | POA: Insufficient documentation

## 2019-10-31 DIAGNOSIS — Z79811 Long term (current) use of aromatase inhibitors: Secondary | ICD-10-CM | POA: Diagnosis not present

## 2019-10-31 DIAGNOSIS — Z923 Personal history of irradiation: Secondary | ICD-10-CM | POA: Insufficient documentation

## 2019-10-31 DIAGNOSIS — N6452 Nipple discharge: Secondary | ICD-10-CM

## 2019-10-31 LAB — COMPREHENSIVE METABOLIC PANEL
ALT: 62 U/L — ABNORMAL HIGH (ref 0–44)
AST: 50 U/L — ABNORMAL HIGH (ref 15–41)
Albumin: 4 g/dL (ref 3.5–5.0)
Alkaline Phosphatase: 120 U/L (ref 38–126)
Anion gap: 7 (ref 5–15)
BUN: 16 mg/dL (ref 8–23)
CO2: 29 mmol/L (ref 22–32)
Calcium: 9.7 mg/dL (ref 8.9–10.3)
Chloride: 103 mmol/L (ref 98–111)
Creatinine, Ser: 0.96 mg/dL (ref 0.44–1.00)
GFR calc Af Amer: >60 mL/min (ref >60)
GFR calc non Af Amer: >60 mL/min (ref >60)
Glucose, Bld: 110 mg/dL — ABNORMAL HIGH (ref 70–99)
Potassium: 4 mmol/L (ref 3.5–5.1)
Sodium: 139 mmol/L (ref 135–145)
Total Bilirubin: 1.1 mg/dL (ref 0.3–1.2)
Total Protein: 7.1 g/dL (ref 6.5–8.1)

## 2019-10-31 LAB — CBC WITH DIFFERENTIAL/PLATELET
Abs Immature Granulocytes: 0.02 10*3/uL (ref 0.00–0.07)
Basophils Absolute: 0 10*3/uL (ref 0.0–0.1)
Basophils Relative: 1 %
Eosinophils Absolute: 0.3 10*3/uL (ref 0.0–0.5)
Eosinophils Relative: 7 %
HCT: 39.4 % (ref 36.0–46.0)
Hemoglobin: 13.1 g/dL (ref 12.0–15.0)
Immature Granulocytes: 1 %
Lymphocytes Relative: 14 %
Lymphs Abs: 0.6 10*3/uL — ABNORMAL LOW (ref 0.7–4.0)
MCH: 31.7 pg (ref 26.0–34.0)
MCHC: 33.2 g/dL (ref 30.0–36.0)
MCV: 95.4 fL (ref 80.0–100.0)
Monocytes Absolute: 0.5 10*3/uL (ref 0.1–1.0)
Monocytes Relative: 12 %
Neutro Abs: 3 10*3/uL (ref 1.7–7.7)
Neutrophils Relative %: 65 %
Platelets: 161 10*3/uL (ref 150–400)
RBC: 4.13 MIL/uL (ref 3.87–5.11)
RDW: 14.8 % (ref 11.5–15.5)
WBC: 4.4 10*3/uL (ref 4.0–10.5)
nRBC: 0 % (ref 0.0–0.2)

## 2019-10-31 NOTE — Progress Notes (Signed)
Patient reports numbness and tinging in feet occasionally, some swelling too. Scheduled for a MRI tomorrow. She reports dry mouth.

## 2019-11-01 ENCOUNTER — Other Ambulatory Visit: Payer: Medicare HMO

## 2019-11-01 LAB — CANCER ANTIGEN 27.29: CA 27.29: 20.4 U/mL (ref 0.0–38.6)

## 2019-11-03 ENCOUNTER — Other Ambulatory Visit: Payer: Self-pay | Admitting: Hematology and Oncology

## 2019-11-03 DIAGNOSIS — Z170 Estrogen receptor positive status [ER+]: Secondary | ICD-10-CM

## 2019-11-03 DIAGNOSIS — N6452 Nipple discharge: Secondary | ICD-10-CM | POA: Insufficient documentation

## 2019-11-22 ENCOUNTER — Ambulatory Visit
Admission: RE | Admit: 2019-11-22 | Discharge: 2019-11-22 | Disposition: A | Discharge status: Home / Self Care | Payer: Medicare HMO | Source: Ambulatory Visit | Attending: Radiation Oncology | Admitting: Radiation Oncology

## 2019-11-22 ENCOUNTER — Other Ambulatory Visit: Payer: Self-pay

## 2019-11-22 VITALS — BP 145/83 | HR 80 | Temp 97.1°F | Resp 16 | Wt 222.8 lb

## 2019-11-22 DIAGNOSIS — C50411 Malignant neoplasm of upper-outer quadrant of right female breast: Secondary | ICD-10-CM | POA: Diagnosis not present

## 2019-11-22 DIAGNOSIS — Z923 Personal history of irradiation: Secondary | ICD-10-CM | POA: Insufficient documentation

## 2019-11-22 DIAGNOSIS — Z170 Estrogen receptor positive status [ER+]: Secondary | ICD-10-CM | POA: Diagnosis not present

## 2019-11-22 NOTE — Progress Notes (Signed)
Radiation Oncology Follow up Note  Name: Jill Bond   Date:   11/22/2019 MRN:  [id number] DOB: 05/20/1950    This 69 y.o. female presents to the clinic today for 22-month follow-up status post whole breast radiation to her right breast and peripheral lymphatics for T1CN1 MI M0 invasive mammary carcinoma ER/PR positive.  REFERRING PROVIDER: Baxter Hire, MD  HPI: Patient is a 69 year old female now at 6 months having completed whole breast and peripheral lymphatic radiation therapy for a T1CN1 MI invasive mammary carcinoma ER/PR positive.  Seen today in routine follow-up she is doing fairly well she continues to have drainage from her nipple.  Both mammogram and ultrasound showed no evidence to suggest malignancy.  She is scheduled for an MRI scan of the breast in several weeks..  She is currently on letrozole tolerating that well without side effect.  She does have on her mammograms ultrasound a fairly large seroma in the upper breast.  COMPLICATIONS OF TREATMENT: none  FOLLOW UP COMPLIANCE: keeps appointments   PHYSICAL EXAM:  BP (!) 145/83 (BP Location: Left Arm, Patient Position: Sitting)   Pulse 80   Temp (!) 97.1 F (36.2 C) (Tympanic)   Resp 16   Wt 222 lb 12.8 oz (101.1 kg)   BMI 38.24 kg/m  Lungs are clear to A&P cardiac examination essentially unremarkable with regular rate and rhythm. No dominant mass or nodularity is noted in either breast in 2 positions examined. Incision is well-healed. No axillary or supraclavicular adenopathy is appreciated. Cosmetic result is excellent.  There is a yellowish discharge from her nipple.  Well-developed well-nourished patient in NAD. HEENT reveals PERLA, EOMI, discs not visualized.  Oral cavity is clear. No oral mucosal lesions are identified. Neck is clear without evidence of cervical or supraclavicular adenopathy. Lungs are clear to A&P. Cardiac examination is essentially unremarkable with regular rate and rhythm without murmur rub or  thrill. Abdomen is benign with no organomegaly or masses noted. Motor sensory and DTR levels are equal and symmetric in the upper and lower extremities. Cranial nerves II through XII are grossly intact. Proprioception is intact. No peripheral adenopathy or edema is identified. No motor or sensory levels are noted. Crude visual fields are within normal range.  RADIOLOGY RESULTS: Mammogram and ultrasound reviewed MRI to be performed in 2 weeks  PLAN: Present time most likely this is related to her seroma.  Mammogram and ultrasound not demonstrated any evidence to suggest recurrent disease or persistent disease.  She is scheduled for MRI scan in 2 weeks which I will review when available.  I have otherwise asked to see her back in 6 months for follow-up.  She may need drainage of the seroma itself.  I would like to take this opportunity to thank you for allowing me to participate in the care of your patient.Noreene Filbert, MD

## 2019-11-28 ENCOUNTER — Other Ambulatory Visit: Payer: Self-pay | Admitting: Hematology and Oncology

## 2019-11-28 DIAGNOSIS — Z170 Estrogen receptor positive status [ER+]: Secondary | ICD-10-CM

## 2019-12-04 DIAGNOSIS — R7309 Other abnormal glucose: Secondary | ICD-10-CM | POA: Diagnosis not present

## 2019-12-04 DIAGNOSIS — N1832 Chronic kidney disease, stage 3b: Secondary | ICD-10-CM | POA: Diagnosis not present

## 2019-12-04 DIAGNOSIS — E7800 Pure hypercholesterolemia, unspecified: Secondary | ICD-10-CM | POA: Diagnosis not present

## 2019-12-04 DIAGNOSIS — K760 Fatty (change of) liver, not elsewhere classified: Secondary | ICD-10-CM | POA: Diagnosis not present

## 2019-12-05 ENCOUNTER — Ambulatory Visit
Admission: RE | Admit: 2019-12-05 | Discharge: 2019-12-05 | Disposition: A | Discharge status: Home / Self Care | Payer: Medicare HMO | Source: Ambulatory Visit | Attending: General Surgery | Admitting: General Surgery

## 2019-12-05 DIAGNOSIS — N6489 Other specified disorders of breast: Secondary | ICD-10-CM | POA: Diagnosis not present

## 2019-12-05 DIAGNOSIS — N6452 Nipple discharge: Secondary | ICD-10-CM

## 2019-12-05 MED ORDER — GADOBUTROL 1 MMOL/ML IV SOLN
10.0000 mL | Freq: Once | INTRAVENOUS | Status: AC | PRN
  Administered 2019-12-05: 10 mL via INTRAVENOUS

## 2019-12-12 ENCOUNTER — Other Ambulatory Visit: Payer: Self-pay | Admitting: Hematology and Oncology

## 2019-12-12 DIAGNOSIS — N1832 Chronic kidney disease, stage 3b: Secondary | ICD-10-CM | POA: Diagnosis not present

## 2019-12-12 DIAGNOSIS — K760 Fatty (change of) liver, not elsewhere classified: Secondary | ICD-10-CM | POA: Diagnosis not present

## 2019-12-12 DIAGNOSIS — I129 Hypertensive chronic kidney disease with stage 1 through stage 4 chronic kidney disease, or unspecified chronic kidney disease: Secondary | ICD-10-CM | POA: Diagnosis not present

## 2019-12-12 DIAGNOSIS — R739 Hyperglycemia, unspecified: Secondary | ICD-10-CM | POA: Diagnosis not present

## 2019-12-12 DIAGNOSIS — R748 Abnormal levels of other serum enzymes: Secondary | ICD-10-CM | POA: Diagnosis not present

## 2019-12-12 DIAGNOSIS — D869 Sarcoidosis, unspecified: Secondary | ICD-10-CM | POA: Diagnosis not present

## 2019-12-12 DIAGNOSIS — Z853 Personal history of malignant neoplasm of breast: Secondary | ICD-10-CM | POA: Diagnosis not present

## 2019-12-12 DIAGNOSIS — Z800 Family history of malignant neoplasm of digestive organs: Secondary | ICD-10-CM | POA: Diagnosis not present

## 2019-12-12 DIAGNOSIS — G4733 Obstructive sleep apnea (adult) (pediatric): Secondary | ICD-10-CM | POA: Diagnosis not present

## 2019-12-12 DIAGNOSIS — N6452 Nipple discharge: Secondary | ICD-10-CM | POA: Diagnosis not present

## 2019-12-12 DIAGNOSIS — M6530 Trigger finger, unspecified finger: Secondary | ICD-10-CM | POA: Diagnosis not present

## 2019-12-12 DIAGNOSIS — C50411 Malignant neoplasm of upper-outer quadrant of right female breast: Secondary | ICD-10-CM | POA: Diagnosis not present

## 2020-01-09 ENCOUNTER — Ambulatory Visit: Payer: Self-pay | Admitting: General Surgery

## 2020-01-09 NOTE — H&P (View-Only) (Signed)
HISTORY OF PRESENT ILLNESS:   Ms. Weatherholtz is a 70 y.o.female patient who comes for follow up breast MRI and right breast nipple discharge.  Ms.Hensleyreports she had her usual screening mammogram. She was found with a suspicious area on the right breast. This led to diagnostic mammogram and ultrasound. Diagnostic mammogram and ultrasound confirmed 2 areas of concern. One area at 9:00 shows 1 cm mass on the mammogram there is another area with calcifications. Post area were biopsied. The 9:00 1 cm mass was positive for invasive mammary carcinoma. The calcifications were benign. There was no suspicious axillary lymph node.She had partial mastectomy with neurologic on February 06, 2019. This showed invasive mammary carcinoma. Sentinel lymph node negative for metastatic disease. Patientcompleted radiation therapy. She is currently on antiestrogen therapy and tolerating well.  In the last 3 to 4 months she has been complaining of clear and bloody nipple discharge. The patient report that the episodes are daily. There is spontaneous. She had an MRI of the breast that showed a masslike lesion under the right nipple. The position of the mass is not amenable for percutaneous biopsy. I personally evaluated the images of the MRI.   PAST MEDICAL HISTORY:  Past Medical History:  Diagnosis Date  . Breast cancer (CMS-HCC)  . Cancer of kidney (CMS-HCC)  . Chickenpox  . Diabetes mellitus type 2, uncomplicated (CMS-HCC)  . Disorders of bursae and tendons in shoulder region, unspecified  . Gout, joint  . History of chicken pox  . Osteoarthrosis, unspecified whether generalized or localized, lower leg  . Rotator cuff injury     PAST SURGICAL HISTORY:  Past Surgical History:  Procedure Laterality Date  . ARTHROSCOPIC ROTATOR CUFF REPAIR Left 03/26/08  . Bladder tack  . COLONOSCOPY 6/08  . HYSTERECTOMY  Partial  . KNEE ARTHROSCOPY Left 12/29/06  Total  . left achilles repair and removal  of calcaneal spur on 02/22/15 Left 02/22/2015  MGT  . MASTECTOMY, PARTIAL Right 02/06/2019  Dr. Lesli Albee - [facility]  . NEPHRECTOMY Left 03/15/06  For malignancy of the kidney  . NEPHRECTOMY Left 03/15/1006  . REPLACEMENT TOTAL KNEE BILATERAL Left 06/12/08  Cemented, computer-assisted navigation  . sinus operation  . TONSILLECTOMY    MEDICATIONS:  Outpatient Encounter Medications as of 12/12/2019  Medication Sig Dispense Refill  . amLODIPine (NORVASC) 10 MG tablet TAKE 1 TABLET EVERY DAY 90 tablet 1  . aspirin/acetaminophen/caffeine (MIGRAINE RELIEF ORAL) Take by mouth One twice a day  . cyclobenzaprine (FLEXERIL) 10 MG tablet Take 1 tablet (10 mg total) by mouth 3 (three) times daily as needed for Muscle spasms 90 tablet 0  . docusate (COLACE) 100 MG capsule Take by mouth  . letrozole (FEMARA) 2.5 mg tablet Take 1 tablet by mouth once daily  . metoprolol succinate (TOPROL-XL) 200 MG XL tablet TAKE 1/2 TABLET EVERY DAY 45 tablet 1  . multivitamin with minerals tablet Take 1 tablet by mouth once daily  . nabumetone (RELAFEN) 500 MG tablet TAKE 1 TABLET BY MOUTH TWICE A DAY FOR 10 DAYS  . nystatin (MYCOSTATIN) 100,000 unit/gram powder Apply topically Apply 1 application topically 3 (three) times daily.  Marland Kitchen omega-3 fatty acids-fish oil 300-500 mg Cap Take 1 capsule by mouth once daily  . omeprazole (PRILOSEC) 20 MG DR capsule TAKE 1 CAPSULE EVERY DAY 90 capsule 1  . silver sulfADIAZINE (SSD) 1 % cream Apply topically as needed  . vitamin E 400 UNIT capsule Take 800 Units by mouth once daily  .  methotrexate sodium 2.5 mg DsPk Hold during radiation (Patient not taking: Reported on 12/12/2019)  . [DISCONTINUED] diclofenac (VOLTAREN) 1 % topical gel Apply topically (Patient not taking: Reported on 12/12/2019 )   No facility-administered encounter medications on file as of 12/12/2019.    ALLERGIES:  Codeine and Penicillins  SOCIAL HISTORY:  Social History   Socioeconomic History  .  Marital status: Widowed  Spouse name: Not on file  . Number of children: 2  . Years of education: Not on file  . Highest education level: Not on file  Occupational History  . Occupation: Disabled  Tobacco Use  . Smoking status: Never Smoker  . Smokeless tobacco: Never Used  Vaping Use  . Vaping Use: Never used  Substance and Sexual Activity  . Alcohol use: No  . Drug use: No  . Sexual activity: Defer  Other Topics Concern  . Not on file  Social History Narrative  . Not on file   Social Determinants of Health   Financial Resource Strain:  . Difficulty of Paying Living Expenses:  Food Insecurity:  . Worried About Charity fundraiser in the Last Year:  . Arboriculturist in the Last Year:  Transportation Needs:  . Film/video editor (Medical):  Marland Kitchen Lack of Transportation (Non-Medical):   FAMILY HISTORY:  Family History  Problem Relation Age of Onset  . Diabetes type II Brother  . Cancer Brother  Unknown primary, metastatic  . Breast cancer Mother  . Colon cancer Father    GENERAL REVIEW OF SYSTEMS:   General ROS: negative for - chills, fatigue, fever, weight gain or weight loss Allergy and Immunology ROS: negative for - hives  Hematological and Lymphatic ROS: negative for - bleeding problems or bruising, negative for palpable nodes Endocrine ROS: negative for - heat or cold intolerance, hair changes Respiratory ROS: negative for - cough, shortness of breath or wheezing Cardiovascular ROS: no chest pain or palpitations GI ROS: negative for nausea, vomiting, abdominal pain, diarrhea, constipation Musculoskeletal ROS: negative for - joint swelling or muscle pain Neurological ROS: negative for - confusion, syncope Dermatological ROS: negative for pruritus and rash  PHYSICAL EXAM:  Vitals:  12/12/19 0945  BP: 122/72  Pulse: 93  .  Ht:160 cm (_0 ) Wt:(!) 102.1 kg (225 lb) KYH:CWCB surface area is 2.13 meters squared. Body mass index is 39.86 kg/m.Marland Kitchen  GENERAL:  Alert, active, oriented x3  HEENT: Pupils equal reactive to light. Extraocular movements are intact. Sclera clear. Palpebral conjunctiva normal red color.Pharynx clear.  NECK: Supple with no palpable mass and no adenopathy.  LUNGS: Sound clear with no rales rhonchi or wheezes.  HEART: Regular rhythm S1 and S2 without murmur.  BREAST: Both breasts were examined in the supine position. There is no palpable mass, nipple discharge or skin changes on the left breast. The right breast shows a thickening of the nipple areolar complex. There is a palpable seroma on the lateral aspect of the left breast. There is no axillary apathy bilaterally.  ABDOMEN: Soft and depressible, nontender with no palpable mass, no hepatomegaly.  EXTREMITIES: Well-developed well-nourished symmetrical with no dependent edema.  NEUROLOGICAL: Awake alert oriented, facial expression symmetrical, moving all extremities.   IMPRESSION:   Nipple discharge in female [N64.52] Persistent nipple discharge. MRI shows a masslike lesion under the nipple area complex at the right breast. As per radiologist this is not amenable for image guided percutaneous biopsy. Excisional biopsy recommended. I discussed with the patient the recommendation of excisional biopsy.  I discussed with the patient the risk of surgery that include skin ischemia, wound infection, bleeding. Due to the persistent nipple discharge and the patient history of breast cancer I recommend excisional biopsy, open. Patient agreed to proceed. While there I will also drain the seroma to rule out that this nipple discharge is due to communication of the ducts with the seroma.  Malignant neoplasm of upper-outer quadrant of right female breast, unspecified estrogen receptor status (CMS-HCC) [C50.411] Pathologic stage is T1cN50m (stage IA)ER/PR positive, HER-2/neu negative -S/p partial mastectomy with sentinel needle biopsy on January 29, 2019. -Completedradiation  therapy.  -Currently onantiestrogen therapy and tolerating well.    PLAN:  1. Excisional biopsy of the right breast (([id number] with drainage of seroma 2. Avoid taking aspirin 5 days before the procedure 3. Contact [place] you have any concern.  Patient verbalized understanding, all questions were answered, and were agreeable with the plan outlined above.   EHerbert Pun MD  I spent more than 45 minutes in this encounter including evaluation of patient, evaluation images, and discussing case with other specialist.  Electronically signed by EHerbert Pun MD

## 2020-01-09 NOTE — H&P (Signed)
HISTORY OF PRESENT ILLNESS:   [name] is a 69 y.o.female patient who comes for follow up breast MRI and right breast nipple discharge.  Ms.Jill Bondreports she had her usual screening mammogram. She was found with a suspicious area on the right breast. This led to diagnostic mammogram and ultrasound. Diagnostic mammogram and ultrasound confirmed 2 areas of concern. One area at 9:00 shows 1 cm mass on the mammogram there is another area with calcifications. Post area were biopsied. The 9:00 1 cm mass was positive for invasive mammary carcinoma. The calcifications were benign. There was no suspicious axillary lymph node.She had partial mastectomy with neurologic on February 06, 2019. This showed invasive mammary carcinoma. Sentinel lymph node negative for metastatic disease. Patientcompleted radiation therapy. She is currently on antiestrogen therapy and tolerating well.  In the last 3 to 4 months she has been complaining of clear and bloody nipple discharge. The patient report that the episodes are daily. There is spontaneous. She had an MRI of the breast that showed a masslike lesion under the right nipple. The position of the mass is not amenable for percutaneous biopsy. I personally evaluated the images of the MRI.   PAST MEDICAL HISTORY:  Past Medical History:  Diagnosis Date  . Breast cancer (CMS-HCC)  . Cancer of kidney (CMS-HCC)  . Chickenpox  . Diabetes mellitus type 2, uncomplicated (CMS-HCC)  . Disorders of bursae and tendons in shoulder region, unspecified  . Gout, joint  . History of chicken pox  . Osteoarthrosis, unspecified whether generalized or localized, lower leg  . Rotator cuff injury     PAST SURGICAL HISTORY:  Past Surgical History:  Procedure Laterality Date  . ARTHROSCOPIC ROTATOR CUFF REPAIR Left 03/26/08  . Bladder tack  . COLONOSCOPY 6/08  . HYSTERECTOMY  Partial  . KNEE ARTHROSCOPY Left 12/29/06  Total  . left achilles repair and removal  of calcaneal spur on 02/22/15 Left 02/22/2015  MGT  . MASTECTOMY, PARTIAL Right 02/06/2019  Dr. Estoria Geary Bond - [facility]  . NEPHRECTOMY Left 03/15/06  For malignancy of the kidney  . NEPHRECTOMY Left 03/15/1006  . REPLACEMENT TOTAL KNEE BILATERAL Left 06/12/08  Cemented, computer-assisted navigation  . sinus operation  . TONSILLECTOMY    MEDICATIONS:  Outpatient Encounter Medications as of 12/12/2019  Medication Sig Dispense Refill  . amLODIPine (NORVASC) 10 MG tablet TAKE 1 TABLET EVERY DAY 90 tablet 1  . aspirin/acetaminophen/caffeine (MIGRAINE RELIEF ORAL) Take by mouth One twice a day  . cyclobenzaprine (FLEXERIL) 10 MG tablet Take 1 tablet (10 mg total) by mouth 3 (three) times daily as needed for Muscle spasms 90 tablet 0  . docusate (COLACE) 100 MG capsule Take by mouth  . letrozole (FEMARA) 2.5 mg tablet Take 1 tablet by mouth once daily  . metoprolol succinate (TOPROL-XL) 200 MG XL tablet TAKE 1/2 TABLET EVERY DAY 45 tablet 1  . multivitamin with minerals tablet Take 1 tablet by mouth once daily  . nabumetone (RELAFEN) 500 MG tablet TAKE 1 TABLET BY MOUTH TWICE A DAY FOR 10 DAYS  . nystatin (MYCOSTATIN) 100,000 unit/gram powder Apply topically Apply 1 application topically 3 (three) times daily.  . omega-3 fatty acids-fish oil 300-500 mg Cap Take 1 capsule by mouth once daily  . omeprazole (PRILOSEC) 20 MG DR capsule TAKE 1 CAPSULE EVERY DAY 90 capsule 1  . silver sulfADIAZINE (SSD) 1 % cream Apply topically as needed  . vitamin E 400 UNIT capsule Take 800 Units by mouth once daily  .   methotrexate sodium 2.5 mg DsPk Hold during radiation (Patient not taking: Reported on 12/12/2019)  . [DISCONTINUED] diclofenac (VOLTAREN) 1 % topical gel Apply topically (Patient not taking: Reported on 12/12/2019 )   No facility-administered encounter medications on file as of 12/12/2019.    ALLERGIES:  Codeine and Penicillins  SOCIAL HISTORY:  Social History   Socioeconomic History  .  Marital status: Widowed  Spouse name: Not on file  . Number of children: 2  . Years of education: Not on file  . Highest education level: Not on file  Occupational History  . Occupation: Disabled  Tobacco Use  . Smoking status: Never Smoker  . Smokeless tobacco: Never Used  Vaping Use  . Vaping Use: Never used  Substance and Sexual Activity  . Alcohol use: No  . Drug use: No  . Sexual activity: Defer  Other Topics Concern  . Not on file  Social History Narrative  . Not on file   Social Determinants of Health   Financial Resource Strain:  . Difficulty of Paying Living Expenses:  Food Insecurity:  . Worried About Running Out of Food in the Last Year:  . Ran Out of Food in the Last Year:  Transportation Needs:  . Lack of Transportation (Medical):  . Lack of Transportation (Non-Medical):   FAMILY HISTORY:  Family History  Problem Relation Age of Onset  . Diabetes type II Brother  . Cancer Brother  Unknown primary, metastatic  . Breast cancer Mother  . Colon cancer Father    GENERAL REVIEW OF SYSTEMS:   General ROS: negative for - chills, fatigue, fever, weight gain or weight loss Allergy and Immunology ROS: negative for - hives  Hematological and Lymphatic ROS: negative for - bleeding problems or bruising, negative for palpable nodes Endocrine ROS: negative for - heat or cold intolerance, hair changes Respiratory ROS: negative for - cough, shortness of breath or wheezing Cardiovascular ROS: no chest pain or palpitations GI ROS: negative for nausea, vomiting, abdominal pain, diarrhea, constipation Musculoskeletal ROS: negative for - joint swelling or muscle pain Neurological ROS: negative for - confusion, syncope Dermatological ROS: negative for pruritus and rash  PHYSICAL EXAM:  Vitals:  12/12/19 0945  BP: 122/72  Pulse: 93  .  Ht:160 cm (5' 3") Wt:(!) 102.1 kg (225 lb) BSA:Body surface area is 2.13 meters squared. Body mass index is 39.86 kg/m..  GENERAL:  Alert, active, oriented x3  HEENT: Pupils equal reactive to light. Extraocular movements are intact. Sclera clear. Palpebral conjunctiva normal red color.Pharynx clear.  NECK: Supple with no palpable mass and no adenopathy.  LUNGS: Sound clear with no rales rhonchi or wheezes.  HEART: Regular rhythm S1 and S2 without murmur.  BREAST: Both breasts were examined in the supine position. There is no palpable mass, nipple discharge or skin changes on the left breast. The right breast shows a thickening of the nipple areolar complex. There is a palpable seroma on the lateral aspect of the left breast. There is no axillary apathy bilaterally.  ABDOMEN: Soft and depressible, nontender with no palpable mass, no hepatomegaly.  EXTREMITIES: Well-developed well-nourished symmetrical with no dependent edema.  NEUROLOGICAL: Awake alert oriented, facial expression symmetrical, moving all extremities.   IMPRESSION:   Nipple discharge in female [N64.52] Persistent nipple discharge. MRI shows a masslike lesion under the nipple area complex at the right breast. As per radiologist this is not amenable for image guided percutaneous biopsy. Excisional biopsy recommended. I discussed with the patient the recommendation of excisional biopsy.   I discussed with the patient the risk of surgery that include skin ischemia, wound infection, bleeding. Due to the persistent nipple discharge and the patient history of breast cancer I recommend excisional biopsy, open. Patient agreed to proceed. While there I will also drain the seroma to rule out that this nipple discharge is due to communication of the ducts with the seroma.  Malignant neoplasm of upper-outer quadrant of right female breast, unspecified estrogen receptor status (CMS-HCC) [C50.411] Pathologic stage is T1cN1mi (stage IA)ER/PR positive, HER-2/neu negative -S/p partial mastectomy with sentinel needle biopsy on January 29, 2019. -Completedradiation  therapy.  -Currently onantiestrogen therapy and tolerating well.    PLAN:  1. Excisional biopsy of the right breast (19120) with drainage of seroma 2. Avoid taking aspirin 5 days before the procedure 3. Contact us if you have any concern.  Patient verbalized understanding, all questions were answered, and were agreeable with the plan outlined above.   Mecca Barga Bond-Diaz, MD  I spent more than 45 minutes in this encounter including evaluation of patient, evaluation images, and discussing case with other specialist.  Electronically signed by Christhoper Busbee Bond-Diaz, MD   

## 2020-01-10 ENCOUNTER — Other Ambulatory Visit
Admission: RE | Admit: 2020-01-10 | Discharge: 2020-01-10 | Disposition: A | Discharge status: Home / Self Care | Payer: Medicare HMO | Source: Ambulatory Visit | Attending: General Surgery | Admitting: General Surgery

## 2020-01-10 ENCOUNTER — Other Ambulatory Visit: Payer: Self-pay

## 2020-01-10 HISTORY — DX: Trigger finger, unspecified finger: M65.30

## 2020-01-10 NOTE — Patient Instructions (Addendum)
Your procedure is scheduled on: Friday January 19, 2020  Report to Day Surgery on the 2nd floor of the [facility]. To find out your arrival time, please call [phone] between 1PM - 3PM on: January 18, 2020 Thursday   REMEMBER: Instructions that are not followed completely may result in serious medical risk, up to and including death; or upon the discretion of your surgeon and anesthesiologist your surgery may need to be rescheduled.  Do not eat food after midnight the night before surgery.  No gum chewing, lozengers or hard candies.  You may however, drink CLEAR liquids up to 2 hours before you are scheduled to arrive for your surgery. Do not drink anything within 2 hours of your scheduled arrival time.  Clear liquids include: - water   Do NOT drink anything that is not on this list.  Type 1 and Type 2 diabetics should only drink water.  TAKE THESE MEDICATIONS THE MORNING OF SURGERY WITH A SIP OF WATER: FEMARA (LETROZOLE) METOPROLOL OMEPRAZOLE (take one the night before and one on the morning of surgery - helps to prevent nausea after surgery.)  NO ASPIRIN PRODUCTS AFTER January 13, 2020  One week prior to surgery: Stop Anti-inflammatories (NSAIDS) such as Advil, Aleve, Ibuprofen, Motrin, Naproxen, Naprosyn and  Aspirin based products such as Excedrin, Goodys Powder, BC Powder. Stop ANY OVER THE COUNTER supplements until after surgery. STOP OMEGA -3 (You may continue taking Tylenol, Vitamin D, Vitamin B, and multivitamin.)  No Alcohol for 24 hours before or after surgery.  No Smoking including e-cigarettes for 24 hours prior to surgery.  No chewable tobacco products for at least 6 hours prior to surgery.  No nicotine patches on the day of surgery.  Do not use any "recreational" drugs for at least a week prior to your surgery.  Please be advised that the combination of cocaine and anesthesia may have negative outcomes, up to and including death. If you test positive  for cocaine, your surgery will be cancelled.  On the morning of surgery brush your teeth with toothpaste and water, you may rinse your mouth with mouthwash if you wish. Do not swallow any toothpaste or mouthwash.  Do not wear jewelry, make-up, hairpins, clips or nail polish.  Do not wear lotions, powders, or perfumes.   Do not shave 48 hours prior to surgery.   Contact lenses, hearing aids and dentures may not be worn into surgery.  Do not bring valuables to the hospital. [facility] is not responsible for any missing/lost belongings or valuables.   Use CHG Soap  as directed on instruction sheet.  Bring your C-PAP to the hospital with you in case you may have to spend the night.   Notify your doctor if there is any change in your medical condition (cold, fever, infection).  Wear comfortable clothing (specific to your surgery type) to the hospital.  Plan for stool softeners for home use; pain medications have a tendency to cause constipation. You can also help prevent constipation by eating foods high in fiber such as fruits and vegetables and drinking plenty of fluids as your diet allows.  After surgery, you can help prevent lung complications by doing breathing exercises.  Take deep breaths and cough every 1-2 hours. Your doctor may order a device called an Incentive Spirometer to help you take deep breaths. When coughing or sneezing, hold a pillow firmly against your incision with both hands. This is called "splinting." Doing this helps protect your incision. It also  decreases belly discomfort.  If you are being discharged the day of surgery, you will not be allowed to drive home. You will need a responsible adult (18 years or older) to drive you home and stay with you that night.   If you are taking public transportation, you will need to have a responsible adult (18 years or older) with you. Please confirm with your physician that it is acceptable to use public transportation.    Please call the [facility] at [phone] if you have any questions about these instructions.  Visitation Policy:  Patients undergoing a surgery or procedure may have one family member or support person with them as long as that person is not COVID-19 positive or experiencing its symptoms.  That person may remain in the waiting area during the procedure.  Inpatient Visitation Update:   In an effort to ensure the safety of our team members and our patients, we are implementing a change to our visitation policy:  Effective Monday, Aug. 9, at 7 a.m., inpatients will be allowed one support person.  o The support person may change daily.  o The support person must pass our screening, gel in and out, and wear a mask at all times, including in the patient's room.  o Patients must also wear a mask when staff or their support person are in the room.  o Masking is required regardless of vaccination status.  Systemwide, no visitors 17 or younger.

## 2020-01-17 ENCOUNTER — Encounter
Admission: RE | Admit: 2020-01-17 | Discharge: 2020-01-17 | Disposition: A | Discharge status: Home / Self Care | Payer: Medicare HMO | Source: Ambulatory Visit | Attending: General Surgery | Admitting: General Surgery

## 2020-01-17 ENCOUNTER — Other Ambulatory Visit: Payer: Self-pay

## 2020-01-17 DIAGNOSIS — Z20822 Contact with and (suspected) exposure to covid-19: Secondary | ICD-10-CM | POA: Diagnosis not present

## 2020-01-17 DIAGNOSIS — Z01810 Encounter for preprocedural cardiovascular examination: Secondary | ICD-10-CM | POA: Insufficient documentation

## 2020-01-17 DIAGNOSIS — Z01812 Encounter for preprocedural laboratory examination: Secondary | ICD-10-CM | POA: Diagnosis not present

## 2020-01-17 DIAGNOSIS — I10 Essential (primary) hypertension: Secondary | ICD-10-CM | POA: Insufficient documentation

## 2020-01-17 LAB — SARS CORONAVIRUS 2 (TAT 6-24 HRS): SARS Coronavirus 2: NEGATIVE

## 2020-01-17 NOTE — Progress Notes (Signed)
  [facility] Perioperative Services: Pre-Admission/Anesthesia Testing   Date: 01/17/20  Name: Jill Bond MRN:   [id number]  Re: Consideration of perioperative therapeutic ABX change in patient with PCN allergy  Request sent to: Herbert Pun, MD Notification mode: Routed and/or faxed via [facility]   This patient is scheduled for a excisional breast biopsy that is currently scheduled on 01/19/2020. In review of her medical records it appears as if she has a documented PCN allergy. The patient has advised that PCN causes her hives. She screened as appropriate for cephalosporin use when pharmacy conducted her medication reconciliation. Patient currently has clindamycin ordered as preoperative prophylactic therapy. As an evidence based approach to reducing the rate of incidence for post-operative SSI and the development of MDROs, could an agent with narrower coverage for preoperative prophylaxis in this patient's upcoming surgical course be considered? Specifically requesting change to a first generation cephalosporin (cefazolin). Please have your staff communicate decision with me. I would be happy to change the orders in Epic as per your directives.   Things to consider: . Many patients report that they were "allergic" to PCN earlier in life, however this does not translate into a true lifelong allergy. Patients can lose sensitivity to specific IgE antibodies over time if PCN is avoided (Kleris & Lugar, 2019).  Marland Kitchen Up to 10% of the adult population and 15% of hospitalized patients report an allergy to PCN, however clinical studies suggest that 90% of those reporting an allergy can tolerate PCN antibiotics (Kleris & Lugar, 2019).  . Cross-sensitivity between PCN and cephalosporins has been documented as being as high as 10%, however this estimation included data believed to have been collected in a setting where there was contamination. Newer data suggests that the  prevalence of cross-sensitivity between PCN and cephalosporins is actually estimated to be closer to 1% (Hermanides et al., 2018).   . Patients labeled as PCN allergic, whether they are truly allergic or not, have been found to have inferior outcomes in terms of rates of serious infection, and these patients tend to have longer hospital stays ([place], 2019).  . Treatment related secondary infections, such as Clostridioides difficile, have been linked to the improper use of broad spectrum antibiotics in patients improperly labeled as PCN allergic (Kleris & Lugar, 2019).  Marland Kitchen Anaphylaxis from cephalosporins is rare and the evidence suggests that there is no increased risk of an anaphylactic type reaction when cephalosporins are used in a PCN allergic patient (Pichichero, 2006).   Citations Hermanides J, Lemkes BA, Prins JM, Hollmann MW, Terreehorst I. Presumed ?-Lactam Allergy and Cross-reactivity in the Operating Theater: A Practical Approach. Anesthesiology. 2018 Aug;129(2):335-342. doi: 10.1097/ALN.0000000000002252. PMID: 87867672.  Kleris, [place]., & Lugar, P. L. (2019). Things We Do For No Reason: Failing to Question a Penicillin Allergy History. Journal of hospital medicine, 14(10), [phone]. Advance online publication. https://www.wallace-middleton.info/  Pichichero, M. E. (2006). Cephalosporins can be prescribed safely for penicillin-allergic patients. Journal of family medicine, 55(2), 106-112. Accessed: https://cdn.mdedge.com/files/s27fs-public/Document/September-2017/5502JFP_AppliedEvidence1.pdf  Honor Loh, MSN, APRN, FNP-C, CEN [facility]  Peri-operative Services Nurse Practitioner Phone: [phone] 01/17/20 10:20 AM

## 2020-01-18 NOTE — Progress Notes (Signed)
  [facility] Perioperative Services: Pre-Admission/Anesthesia Testing     Date: 01/18/20  Name: Jill Bond MRN:   [id number]  Re: Change in [place] for upcoming therapy  Patient scheduled for upcoming surgery on 01/19/2020 . Primary attending surgeon was consulted regarding consideration of therapeutic change in antimicrobial agent being used for preoperative prophylaxis in this patient's case. Following analysis of the risk versus benefits, Dr. Windell Moment advising that it would be acceptable to discontinue the ordered clindamycin and place an order for cefazolin 2 gm IV on call to the OR. Orders for this patient were amended by me following collaborative conversation with attending surgeon.  Honor Loh, MSN, APRN, FNP-C, CEN [facility]  Peri-operative Services Nurse Practitioner Phone: [phone] 01/18/20 1:23 PM

## 2020-01-19 ENCOUNTER — Ambulatory Visit
Admission: RE | Admit: 2020-01-19 | Discharge: 2020-01-19 | Disposition: A | Discharge status: Home / Self Care | Payer: Medicare HMO | Attending: General Surgery | Admitting: General Surgery

## 2020-01-19 ENCOUNTER — Other Ambulatory Visit: Payer: Self-pay

## 2020-01-19 ENCOUNTER — Encounter
Admission: RE | Disposition: A | Discharge status: Home / Self Care | Payer: Self-pay | Source: Home / Self Care | Attending: General Surgery

## 2020-01-19 ENCOUNTER — Encounter: Payer: Self-pay | Admitting: General Surgery

## 2020-01-19 ENCOUNTER — Ambulatory Visit: Payer: Medicare HMO | Admitting: Urgent Care

## 2020-01-19 DIAGNOSIS — Z803 Family history of malignant neoplasm of breast: Secondary | ICD-10-CM | POA: Insufficient documentation

## 2020-01-19 DIAGNOSIS — C50411 Malignant neoplasm of upper-outer quadrant of right female breast: Secondary | ICD-10-CM | POA: Insufficient documentation

## 2020-01-19 DIAGNOSIS — N6489 Other specified disorders of breast: Secondary | ICD-10-CM | POA: Diagnosis not present

## 2020-01-19 DIAGNOSIS — Z880 Allergy status to penicillin: Secondary | ICD-10-CM | POA: Insufficient documentation

## 2020-01-19 DIAGNOSIS — Z833 Family history of diabetes mellitus: Secondary | ICD-10-CM | POA: Diagnosis not present

## 2020-01-19 DIAGNOSIS — Z7982 Long term (current) use of aspirin: Secondary | ICD-10-CM | POA: Insufficient documentation

## 2020-01-19 DIAGNOSIS — Z170 Estrogen receptor positive status [ER+]: Secondary | ICD-10-CM | POA: Diagnosis not present

## 2020-01-19 DIAGNOSIS — N6089 Other benign mammary dysplasias of unspecified breast: Secondary | ICD-10-CM | POA: Insufficient documentation

## 2020-01-19 DIAGNOSIS — Z96653 Presence of artificial knee joint, bilateral: Secondary | ICD-10-CM | POA: Insufficient documentation

## 2020-01-19 DIAGNOSIS — N6031 Fibrosclerosis of right breast: Secondary | ICD-10-CM | POA: Diagnosis not present

## 2020-01-19 DIAGNOSIS — Z885 Allergy status to narcotic agent status: Secondary | ICD-10-CM | POA: Diagnosis not present

## 2020-01-19 DIAGNOSIS — N183 Chronic kidney disease, stage 3 unspecified: Secondary | ICD-10-CM | POA: Diagnosis not present

## 2020-01-19 DIAGNOSIS — G4730 Sleep apnea, unspecified: Secondary | ICD-10-CM | POA: Diagnosis not present

## 2020-01-19 DIAGNOSIS — N6452 Nipple discharge: Secondary | ICD-10-CM | POA: Insufficient documentation

## 2020-01-19 DIAGNOSIS — N6041 Mammary duct ectasia of right breast: Secondary | ICD-10-CM | POA: Insufficient documentation

## 2020-01-19 DIAGNOSIS — Z905 Acquired absence of kidney: Secondary | ICD-10-CM | POA: Diagnosis not present

## 2020-01-19 DIAGNOSIS — Z923 Personal history of irradiation: Secondary | ICD-10-CM | POA: Diagnosis not present

## 2020-01-19 DIAGNOSIS — Z79899 Other long term (current) drug therapy: Secondary | ICD-10-CM | POA: Diagnosis not present

## 2020-01-19 DIAGNOSIS — I10 Essential (primary) hypertension: Secondary | ICD-10-CM | POA: Diagnosis not present

## 2020-01-19 DIAGNOSIS — I129 Hypertensive chronic kidney disease with stage 1 through stage 4 chronic kidney disease, or unspecified chronic kidney disease: Secondary | ICD-10-CM | POA: Diagnosis not present

## 2020-01-19 DIAGNOSIS — Z79811 Long term (current) use of aromatase inhibitors: Secondary | ICD-10-CM | POA: Insufficient documentation

## 2020-01-19 DIAGNOSIS — Z9011 Acquired absence of right breast and nipple: Secondary | ICD-10-CM | POA: Insufficient documentation

## 2020-01-19 DIAGNOSIS — Z85528 Personal history of other malignant neoplasm of kidney: Secondary | ICD-10-CM | POA: Insufficient documentation

## 2020-01-19 DIAGNOSIS — E119 Type 2 diabetes mellitus without complications: Secondary | ICD-10-CM | POA: Insufficient documentation

## 2020-01-19 DIAGNOSIS — L7634 Postprocedural seroma of skin and subcutaneous tissue following other procedure: Secondary | ICD-10-CM | POA: Diagnosis not present

## 2020-01-19 DIAGNOSIS — F418 Other specified anxiety disorders: Secondary | ICD-10-CM | POA: Diagnosis not present

## 2020-01-19 DIAGNOSIS — N6341 Unspecified lump in right breast, subareolar: Secondary | ICD-10-CM | POA: Diagnosis present

## 2020-01-19 DIAGNOSIS — K219 Gastro-esophageal reflux disease without esophagitis: Secondary | ICD-10-CM | POA: Insufficient documentation

## 2020-01-19 HISTORY — PX: EXCISION / BIOPSY BREAST / NIPPLE / DUCT: SUR469

## 2020-01-19 HISTORY — PX: EXCISION OF BREAST BIOPSY: SHX5822

## 2020-01-19 SURGERY — EXCISION OF BREAST BIOPSY
Anesthesia: General | Site: Breast | Laterality: Right

## 2020-01-19 MED ORDER — DEXAMETHASONE SODIUM PHOSPHATE 10 MG/ML IJ SOLN
INTRAMUSCULAR | Status: DC | PRN
  Administered 2020-01-19: 10 mg via INTRAVENOUS

## 2020-01-19 MED ORDER — CEFAZOLIN SODIUM-DEXTROSE 2-4 GM/100ML-% IV SOLN
2.0000 g | Freq: Once | INTRAVENOUS | Status: AC
  Administered 2020-01-19: 2 g via INTRAVENOUS

## 2020-01-19 MED ORDER — PROPOFOL 10 MG/ML IV BOLUS
INTRAVENOUS | Status: DC | PRN
  Administered 2020-01-19: 150 mg via INTRAVENOUS

## 2020-01-19 MED ORDER — CHLORHEXIDINE GLUCONATE 0.12 % MT SOLN
OROMUCOSAL | Status: AC
  Administered 2020-01-19: 15 mL via OROMUCOSAL
  Filled 2020-01-19: qty 15

## 2020-01-19 MED ORDER — CEFAZOLIN SODIUM-DEXTROSE 2-4 GM/100ML-% IV SOLN
INTRAVENOUS | Status: AC
  Filled 2020-01-19: qty 100

## 2020-01-19 MED ORDER — ORAL CARE MOUTH RINSE: 15.0000 mL | Freq: Once | OROMUCOSAL | Status: AC

## 2020-01-19 MED ORDER — PROPOFOL 10 MG/ML IV BOLUS
INTRAVENOUS | Status: AC
  Filled 2020-01-19: qty 20

## 2020-01-19 MED ORDER — ACETAMINOPHEN 10 MG/ML IV SOLN
INTRAVENOUS | Status: DC | PRN
  Administered 2020-01-19: 1000 mg via INTRAVENOUS

## 2020-01-19 MED ORDER — BUPIVACAINE-EPINEPHRINE (PF) 0.5% -1:200000 IJ SOLN
INTRAMUSCULAR | Status: AC
  Filled 2020-01-19: qty 30

## 2020-01-19 MED ORDER — MIDAZOLAM HCL 2 MG/2ML IJ SOLN
INTRAMUSCULAR | Status: DC | PRN
  Administered 2020-01-19: 2 mg via INTRAVENOUS

## 2020-01-19 MED ORDER — ONDANSETRON HCL 4 MG/2ML IJ SOLN
INTRAMUSCULAR | Status: DC | PRN
  Administered 2020-01-19: 4 mg via INTRAVENOUS

## 2020-01-19 MED ORDER — ACETAMINOPHEN 10 MG/ML IV SOLN
INTRAVENOUS | Status: AC
  Filled 2020-01-19: qty 100

## 2020-01-19 MED ORDER — FENTANYL CITRATE (PF) 100 MCG/2ML IJ SOLN
INTRAMUSCULAR | Status: DC | PRN
Start: 2020-01-19 — End: 2020-01-19
  Administered 2020-01-19: 100 ug via INTRAVENOUS

## 2020-01-19 MED ORDER — ONDANSETRON HCL 4 MG PO TABS: 4.0000 mg | ORAL_TABLET | Freq: Three times a day (TID) | ORAL | 0 refills | Status: AC | PRN

## 2020-01-19 MED ORDER — ONDANSETRON HCL 4 MG/2ML IJ SOLN: 4.0000 mg | Freq: Once | INTRAMUSCULAR | Status: DC | PRN

## 2020-01-19 MED ORDER — LACTATED RINGERS IV SOLN: INTRAVENOUS | Status: DC

## 2020-01-19 MED ORDER — FENTANYL CITRATE (PF) 100 MCG/2ML IJ SOLN: 25.0000 ug | INTRAMUSCULAR | Status: DC | PRN

## 2020-01-19 MED ORDER — TRAMADOL HCL 50 MG PO TABS: 50.0000 mg | ORAL_TABLET | Freq: Four times a day (QID) | ORAL | 0 refills | Status: AC | PRN

## 2020-01-19 MED ORDER — MIDAZOLAM HCL 2 MG/2ML IJ SOLN
INTRAMUSCULAR | Status: AC
  Filled 2020-01-19: qty 2

## 2020-01-19 MED ORDER — CHLORHEXIDINE GLUCONATE 0.12 % MT SOLN: 15.0000 mL | Freq: Once | OROMUCOSAL | Status: AC

## 2020-01-19 MED ORDER — FENTANYL CITRATE (PF) 100 MCG/2ML IJ SOLN
INTRAMUSCULAR | Status: AC
  Filled 2020-01-19: qty 2

## 2020-01-19 MED ORDER — BUPIVACAINE-EPINEPHRINE (PF) 0.5% -1:200000 IJ SOLN
INTRAMUSCULAR | Status: DC | PRN
  Administered 2020-01-19: 30 mL

## 2020-01-19 MED ORDER — LIDOCAINE HCL (CARDIAC) PF 100 MG/5ML IV SOSY
PREFILLED_SYRINGE | INTRAVENOUS | Status: DC | PRN
  Administered 2020-01-19: 100 mg via INTRAVENOUS

## 2020-01-19 SURGICAL SUPPLY — 43 items
BLADE SURG 15 STRL LF DISP TIS (BLADE) ×1 IMPLANT
BLADE SURG 15 STRL SS (BLADE) ×2
CANISTER SUCT 1200ML W/VALVE (MISCELLANEOUS) ×3 IMPLANT
CHLORAPREP W/TINT 26 (MISCELLANEOUS) ×3 IMPLANT
CNTNR SPEC 2.5X3XGRAD LEK (MISCELLANEOUS) ×1
CONT SPEC 4OZ STER OR WHT (MISCELLANEOUS) ×2
CONTAINER SPEC 2.5X3XGRAD LEK (MISCELLANEOUS) ×1 IMPLANT
COVER WAND RF STERILE (DRAPES) ×3 IMPLANT
DERMABOND ADVANCED (GAUZE/BANDAGES/DRESSINGS) ×2
DERMABOND ADVANCED .7 DNX12 (GAUZE/BANDAGES/DRESSINGS) ×1 IMPLANT
DEVICE DUBIN SPECIMEN MAMMOGRA (MISCELLANEOUS) ×3 IMPLANT
DRAPE LAPAROTOMY TRNSV 106X77 (MISCELLANEOUS) ×3 IMPLANT
ELECT CAUTERY BLADE TIP 2.5 (TIP) ×3
ELECT REM PT RETURN 9FT ADLT (ELECTROSURGICAL) ×3
ELECTRODE CAUTERY BLDE TIP 2.5 (TIP) ×1 IMPLANT
ELECTRODE REM PT RTRN 9FT ADLT (ELECTROSURGICAL) ×1 IMPLANT
GLOVE BIO SURGEON STRL SZ 6.5 (GLOVE) ×2 IMPLANT
GLOVE BIO SURGEONS STRL SZ 6.5 (GLOVE) ×1
GLOVE BIOGEL PI IND STRL 6.5 (GLOVE) ×1 IMPLANT
GLOVE BIOGEL PI INDICATOR 6.5 (GLOVE) ×2
GOWN STRL REUS W/ TWL LRG LVL3 (GOWN DISPOSABLE) ×3 IMPLANT
GOWN STRL REUS W/TWL LRG LVL3 (GOWN DISPOSABLE) ×6
KIT MARKER MARGIN INK (KITS) ×2 IMPLANT
KIT TURNOVER KIT A (KITS) ×3 IMPLANT
LABEL OR SOLS (LABEL) ×3 IMPLANT
MARGIN MAP 10MM (MISCELLANEOUS) ×3 IMPLANT
MARKER MARGIN CORRECT CLIP (MARKER) IMPLANT
NDL HYPO 25X1 1.5 SAFETY (NEEDLE) ×1 IMPLANT
NEEDLE HYPO 25X1 1.5 SAFETY (NEEDLE) ×3 IMPLANT
PACK BASIN MINOR (MISCELLANEOUS) ×3 IMPLANT
RETRACTOR RING XSMALL (MISCELLANEOUS) IMPLANT
RTRCTR WOUND ALEXIS 13CM XS SH (MISCELLANEOUS)
SUT ETHILON 3-0 FS-10 30 BLK (SUTURE) ×3
SUT MNCRL 4-0 (SUTURE) ×2
SUT MNCRL 4-0 27XMFL (SUTURE) ×1
SUT SILK 2 0 SH (SUTURE) ×3 IMPLANT
SUT VIC AB 3-0 SH 27 (SUTURE) ×2
SUT VIC AB 3-0 SH 27X BRD (SUTURE) ×1 IMPLANT
SUTURE EHLN 3-0 FS-10 30 BLK (SUTURE) ×1 IMPLANT
SUTURE MNCRL 4-0 27XMF (SUTURE) ×1 IMPLANT
SYR 10ML LL (SYRINGE) ×3 IMPLANT
SYR BULB IRRIG 60ML STRL (SYRINGE) ×3 IMPLANT
WATER STERILE IRR 1000ML POUR (IV SOLUTION) ×3 IMPLANT

## 2020-01-19 NOTE — Discharge Instructions (Addendum)
  Diet: Resume home heart healthy regular diet.   Activity: Increase activity as tolerated. Light activity and walking are encouraged. Do not drive or drink alcohol if taking narcotic pain medications.  Wound care: May shower with soapy water and pat dry (do not rub incisions), but no baths or submerging incision underwater until follow-up. (no swimming)   Medications: Resume all home medications. For mild to moderate pain: acetaminophen (Tylenol) or ibuprofen (if no kidney disease). Combining Tylenol with alcohol can substantially increase your risk of causing liver disease. Narcotic pain medications, if prescribed, can be used for severe pain, though may cause nausea, constipation, and drowsiness. If you do not need the narcotic pain medication, you do not need to fill the prescription.  Call office [phone]) at any time if any questions, worsening pain, fevers/chills, bleeding, drainage from incision site, or other concerns.   AMBULATORY SURGERY  DISCHARGE INSTRUCTIONS   1) The drugs that you were given will stay in your system until tomorrow so for the next 24 hours you should not:  A) Drive an automobile B) Make any legal decisions C) Drink any alcoholic beverage   2) You may resume regular meals tomorrow.  Today it is better to start with liquids and gradually work up to solid foods.  You may eat anything you prefer, but it is better to start with liquids, then soup and crackers, and gradually work up to solid foods.   3) Please notify your doctor immediately if you have any unusual bleeding, trouble breathing, redness and pain at the surgery site, drainage, fever, or pain not relieved by medication.    4) Additional Instructions:        Please contact your physician with any problems or Same Day Surgery at [phone], Monday through Friday 6 am to 4 pm, or  at [facility] number at [phone].

## 2020-01-19 NOTE — Interval H&P Note (Signed)
History and Physical Interval Note:  01/19/2020 7:03 AM  Jill Bond  has presented today for surgery, with the diagnosis of N64.52 Nipple discharge in female.  The various methods of treatment have been discussed with the patient and family. After consideration of risks, benefits and other options for treatment, the patient has consented to  Procedure(s): EXCISION OF BREAST BIOPSY (Right) as a surgical intervention.  The patient's history has been reviewed, patient examined, no change in status, stable for surgery.  I have reviewed the patient's chart and labs.  Right breast marked in the pre procedure room. Questions were answered to the patient's satisfaction.     Herbert Pun

## 2020-01-19 NOTE — Transfer of Care (Signed)
Immediate Anesthesia Transfer of Care Note  Patient: Jill Bond  Procedure(s) Performed: EXCISION OF BREAST BIOPSY (Right Breast)  Patient Location: PACU  Anesthesia Type:General  Level of Consciousness: awake, alert  and oriented  Airway & Oxygen Therapy: Patient Spontanous Breathing and Patient connected to nasal cannula oxygen  Post-op Assessment: Report given to RN and Post -op Vital signs reviewed and stable  Post vital signs: Reviewed and stable  Last Vitals:  Vitals Value Taken Time  BP 137/92 01/19/20 0846  Temp    Pulse 66 01/19/20 0847  Resp 15 01/19/20 0847  SpO2 99 % 01/19/20 0847  Vitals shown include unvalidated device data.  Last Pain:  Vitals:   01/19/20 0645  TempSrc: Temporal  PainSc: 0-No pain         Complications: No complications documented.

## 2020-01-19 NOTE — Anesthesia Procedure Notes (Signed)
Procedure Name: LMA Insertion Date/Time: 01/19/2020 7:38 AM Performed by: Genevie Ann, CRNA Pre-anesthesia Checklist: Patient identified, Emergency Drugs available, Suction available, Patient being monitored and Timeout performed Patient Re-evaluated:Patient Re-evaluated prior to induction Oxygen Delivery Method: Circle system utilized Preoxygenation: Pre-oxygenation with 100% oxygen Induction Type: IV induction LMA: LMA inserted LMA Size: 4.0 Tube type: Oral Number of attempts: 1

## 2020-01-19 NOTE — Anesthesia Postprocedure Evaluation (Signed)
Anesthesia Post Note  Patient: Jill Bond  Procedure(s) Performed: EXCISION OF BREAST BIOPSY (Right Breast)  Patient location during evaluation: PACU Anesthesia Type: General Level of consciousness: awake and alert Pain management: pain level controlled Vital Signs Assessment: post-procedure vital signs reviewed and stable Respiratory status: spontaneous breathing and respiratory function stable Cardiovascular status: stable Anesthetic complications: no   No complications documented.   Last Vitals:  Vitals:   01/19/20 0846 01/19/20 0901  BP: (!) 137/92 135/73  Pulse: 66 (!) 59  Resp: 13 12  Temp: (!) 36.1 C   SpO2: 98% 98%    Last Pain:  Vitals:   01/19/20 0901  TempSrc:   PainSc: 0-No pain                 Tari Lecount K

## 2020-01-19 NOTE — Op Note (Signed)
Preoperative diagnosis: Right breast mass.  Postoperative diagnosis: Right breast mass.  Procedure: Right excisional biopsy.                      Ultrasound guided aspiration of seroma.   Anesthesia: GETA  Surgeon: Dr. Windell Moment  Wound Classification: Clean  Indications:  Patient is a 69 y.o. female with history of right breast cancer, now with a right breast mass under the nipple/areola complex with persistent nipple discharge. Mass only seen on MRI and unable to get needle biopsy. Also patient with large right breast seroma  Findings: 1. 1 cm mass under nipple areola complex.  2. Large seroma completely aspirated.   Description of procedure: The patient was taken to the operating room and placed supine on the operating table, and after general anesthesia was administered, the right chest and axilla were prepped and draped in the usual sterile fashion. A time-out was completed verifying correct patient, procedure, site, positioning, and implant(s) and/or special equipment prior to beginning this procedure.  A circumareolar skin incision incision was planned adjacent to the nipple areola complex. Local anesthesia was infiltrated and a skin incision was made. Flaps were raised. The mass was dissected from surrounding tissues using cautery. After removing the tissue under the areola, the cavity was palpated and no additional abnormalities was palpated. The specimen was oriented and submitted to pathology.  Hemostasis was achieved with electrocautery and suture ligatures of 3-0 Vicryl. The subcutaneous tissue immediately under the skin was approximated with interrupted 3-0 Vicryl sutures.  No attempt was made to close the dead space. The skin was closed with a subcuticular suture of running monocryl 3-0. A dressing was applied. Using guidance with ultrasound, a large seroma was identified. With a 18 g needle, 20 cc of serous fluid was aspirated. Complete collapse of the cavity as confirmed  with the ultrasound.   The patient tolerated the procedure well and was taken to the postanesthesia care unit in stable condition.   Specimen: Right breast excisional biopsy  Complications: None  Estimated Blood Loss: 5 mL

## 2020-01-19 NOTE — Anesthesia Preprocedure Evaluation (Signed)
Anesthesia Evaluation  Patient identified by MRN, date of birth, ID band Patient awake    Reviewed: Allergy & Precautions, NPO status , Patient's Chart, lab work & pertinent test results  History of Anesthesia Complications (+) PONV and history of anesthetic complications  Airway Mallampati: III       Dental   Pulmonary sleep apnea and Continuous Positive Airway Pressure Ventilation , neg COPD, Not current smoker,           Cardiovascular hypertension, Pt. on medications (-) Past MI and (-) CHF (-) dysrhythmias (-) Valvular Problems/Murmurs     Neuro/Psych neg Seizures Anxiety Depression    GI/Hepatic Neg liver ROS, GERD  Medicated and Controlled,  Endo/Other  neg diabetes  Renal/GU Renal InsufficiencyRenal disease     Musculoskeletal   Abdominal   Peds  Hematology   Anesthesia Other Findings   Reproductive/Obstetrics                             Anesthesia Physical Anesthesia Plan  ASA: III  Anesthesia Plan: General   Post-op Pain Management:    Induction: Intravenous  PONV Risk Score and Plan: 4 or greater and Ondansetron, Dexamethasone, Midazolam and Treatment may vary due to age or medical condition  Airway Management Planned: LMA  Additional Equipment:   Intra-op Plan:   Post-operative Plan:   Informed Consent: I have reviewed the patients History and Physical, chart, labs and discussed the procedure including the risks, benefits and alternatives for the proposed anesthesia with the patient or authorized representative who has indicated his/her understanding and acceptance.       Plan Discussed with:   Anesthesia Plan Comments:         Anesthesia Quick Evaluation

## 2020-01-22 LAB — SURGICAL PATHOLOGY

## 2020-01-30 ENCOUNTER — Other Ambulatory Visit: Payer: Self-pay | Admitting: Internal Medicine

## 2020-01-31 ENCOUNTER — Inpatient Hospital Stay: Payer: Medicare HMO | Attending: Hematology and Oncology

## 2020-01-31 ENCOUNTER — Encounter: Payer: Medicare HMO | Admitting: Hematology and Oncology

## 2020-02-12 NOTE — Progress Notes (Signed)
This encounter was created in error - please disregard.

## 2020-02-20 NOTE — Progress Notes (Signed)
[facility]  [address], [address] [place], [place] [address] Phone: [phone]  Fax: [phone]   Clinic Day:  02/21/2020  Referring physician: Baxter Hire, MD  Chief Complaint: Jill Bond is a 69 y.o. female with sarcoid andstage IAright breastcancer who is seen for 3 month assessment.  HPI: The patient was last seen in the medical oncology clinic on 10/31/2019. At that time, she noted right nipple discharge.  Exam revealed no palpable abnormality.  Bilateral breast MRI on 12/05/2019 revealed a 1 cm focal enhancement at the base of the right nipple.  This was not amenable to image-guided biopsy due to its far anterior, retroareolar location. A correlate was not identified sonographically on prior diagnostic workup. Recommendation was for surgical evaluation for possible excisional biopsy.  There were expected postsurgical and postradiation changes of the right breast.  There was no MRI evidence of malignancy on the left.  Right breast excisional biopsy on 01/19/2020 by Dr Windell Moment revealed focal duct ectasia with intraluminal inflammatory debris, usual ductal hyperplasia, and patchy stromal fibrosis. There was no atypia or malignancy.    During the interim, she has been "better than she has been".  She tested positive for COVID-19 during the interim and does not have her sense of taste and smell back. She still has a cough and dry mouth. She reports dysuria. Her leg spasms have resolved. She denies nausea, vomiting, and diarrhea.   Past Medical History:  Diagnosis Date  . Anxiety   . Arthritis   . Breast cancer ([facility]) 01/2019   invasive mammary carcinoma   . Chronic kidney disease 2006   Left Nephrectomy  . Complication of anesthesia    PONV  . Constipation   . Depression   . GERD (gastroesophageal reflux disease)   . Headache    migraines  . Hypertension   . Personal history of radiation therapy   . PONV (postoperative nausea  and vomiting)   . Rotator cuff disorder    LEFT  . Sarcoidosis   . Shortness of breath dyspnea    with exertion  . Sleep apnea   . Trigger finger, right     Past Surgical History:  Procedure Laterality Date  . ABDOMINAL HYSTERECTOMY     PARTIAL  . ACHILLES TENDON SURGERY Left 02/22/2015   Procedure: Secondary ACHILLES TENDON REPAIR;  Surgeon: Albertine Patricia, DPM;  Location: [facility] ORS;  Service: Podiatry;  Laterality: Left;  . Bladder tac    . BREAST BIOPSY Right 01/23/2019   affirm stereo bx of calcs with x marker, ductal hyperplasia  . BREAST BIOPSY Right 01/23/2019   [place] bx of mass with coil marker, invasive mammary carcinoma  . BREAST EXCISIONAL BIOPSY Left 2003   neg  . BREAST LUMPECTOMY Right 02/06/2019  . COLONOSCOPY  2013  . EXCISION OF BREAST BIOPSY Right 01/19/2020   Procedure: EXCISION OF BREAST BIOPSY;  Surgeon: Herbert Pun, MD;  Location: [facility] ORS;  Service: General;  Laterality: Right;  . JOINT REPLACEMENT Left 2007   Total Knee Replacement  . KIDNEY CYST REMOVAL Left   . NEPHRECTOMY Left   . OSTECTOMY Left 02/22/2015   Procedure: OSTECTOMY;  Surgeon: Albertine Patricia, DPM;  Location: [facility] ORS;  Service: Podiatry;  Laterality: Left;  . PARTIAL MASTECTOMY WITH NEEDLE LOCALIZATION AND AXILLARY SENTINEL LYMPH NODE BX Right 02/06/2019   Procedure: PARTIAL MASTECTOMY WITH NEEDLE LOCALIZATION AND AXILLARY SENTINEL LYMPH NODE BX;  Surgeon: Herbert Pun, MD;  Location: [facility] ORS;  Service: General;  Laterality: Right;  . SHOULDER ARTHROSCOPY W/ ROTATOR CUFF REPAIR Left YRS AGO  . TONSILLECTOMY  AGE 43  . TOTAL KNEE ARTHROPLASTY Right 10/22/2015   Procedure: RIGHT TOTAL KNEE ARTHROPLASTY;  Surgeon: Paralee Cancel, MD;  Location: [facility];  Service: Orthopedics;  Laterality: Right;  . TOTAL KNEE ARTHROPLASTY Right    10/22/2015    Family History  Problem Relation Age of Onset  . Breast cancer Mother 25  . Heart failure Father   . Prostate cancer Father      Social History:  reports that she has never smoked. She has never used smokeless tobacco. She reports that she does not drink alcohol and does not use drugs. She denies any exposure to radiation or toxins. 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 Her husband died on 2013/10/05.Her first cousinis [name]([phone]).Her daughter, Pat,is her medical power of attorney. The patient is alone today.   Allergies:  Allergies  Allergen Reactions  . Codeine Nausea And Vomiting  . Penicillins Hives and Other (See Comments)    Has patient had a PCN reaction causing immediate rash, facial/tongue/throat swelling, SOB or lightheadedness with hypotension: no Has patient had a PCN reaction causing severe rash involving mucus membranes or skin necrosis: no Has patient had a PCN reaction that required hospitalization no Has patient had a PCN reaction occurring within the last 10 years: no If all of the above answers are "NO", then may proceed with Cephalosporin use.     Current Medications: Current Outpatient Medications  Medication Sig Dispense Refill  . amLODipine (NORVASC) 10 MG tablet Take 10 mg by mouth at bedtime.    . cyclobenzaprine (FLEXERIL) 10 MG tablet Take 1 tablet by mouth 2 (two) times daily.     Marland Kitchen letrozole (FEMARA) 2.5 MG tablet TAKE 1 TABLET BY MOUTH EVERY DAY (Patient taking differently: Take 2.5 mg by mouth daily. ) 90 tablet 1  . methotrexate (RHEUMATREX) 2.5 MG tablet TAKE 4 TABLETS (10 MG TOTAL) BY MOUTH ONCE A WEEK. CAUTION:CHEMOTHERAPY. PROTECT FROM LIGHT. (Patient taking differently: Take 10 mg by mouth every Sunday. Caution:Chemotherapy. Protect from light.) 52 tablet 2  . metoprolol (TOPROL-XL) 200 MG 24 hr tablet Take 100 mg by mouth daily.    . Multiple Vitamin (MULTIVITAMIN WITH MINERALS) TABS tablet Take 1 tablet by mouth daily.     . Omega-3 Fatty Acids (OMEGA 3 500) 500 MG CAPS Take 500 mg by mouth daily.    Marland Kitchen omeprazole (PRILOSEC) 20 MG  capsule Take 20 mg by mouth daily.     . silver sulfADIAZINE (SILVADENE) 1 % cream Apply 1 application topically daily as needed (skin irritation).    . vitamin E 180 MG (400 UNITS) capsule Take 800 Units by mouth daily.    Marland Kitchen aspirin-acetaminophen-caffeine (MIGRAINE RELIEF) 250-250-65 MG tablet Take 1 tablet by mouth every 6 (six) hours as needed for headache. (Patient not taking: Reported on 02/21/2020)    . diclofenac sodium (VOLTAREN) 1 % GEL Apply 1 application topically 2 (two) times daily as needed (foot pain).  (Patient not taking: Reported on 02/21/2020)    . docusate sodium (COLACE) 100 MG capsule Take 300 mg by mouth at bedtime.  (Patient not taking: Reported on 02/21/2020)    . nystatin (MYCOSTATIN/NYSTOP) powder Apply 1 application topically 3 (three) times daily. (Patient not taking: Reported on 01/03/2020) 15 g 0  . ondansetron (ZOFRAN) 4 MG tablet Take 1 tablet (4 mg total) by mouth every 8 (eight) hours as  needed for nausea or vomiting. (Patient not taking: Reported on 02/21/2020) 10 tablet 0   No current facility-administered medications for this visit.    Review of Systems  Constitutional: Positive for weight loss (17 lbs). Negative for chills, diaphoresis, fever and malaise/fatigue.       Feeling "better than she has been."  HENT: Negative for congestion, ear discharge, ear pain, hearing loss, nosebleeds, sinus pain and sore throat.        Dry mouth. No sense of taste or smell s/p COVID-19 infection  Eyes: Negative for blurred vision.  Respiratory: Positive for cough. Negative for hemoptysis, sputum production and shortness of breath.        Sleep apnea. Uses CPAP sometimes. Recent COVID-19 infection.  Cardiovascular: Negative for chest pain, palpitations and leg swelling.  Gastrointestinal: Negative.  Negative for abdominal pain, blood in stool, constipation, diarrhea, heartburn, melena, nausea and vomiting.  Genitourinary: Positive for dysuria. Negative for frequency,  hematuria and urgency.  Musculoskeletal: Negative for back pain, joint pain, myalgias and neck pain.  Skin: Negative for itching and rash.  Neurological: Negative.  Negative for dizziness, tingling, sensory change, weakness and headaches.  Endo/Heme/Allergies: Negative.  Does not bruise/bleed easily.  Psychiatric/Behavioral: Positive for memory loss. Negative for depression. The patient is not nervous/anxious and does not have insomnia.   All other systems reviewed and are negative.   Performance status (ECOG): 1  Vitals Blood pressure (!) 150/78, pulse 86, temperature 97.8 F (36.6 C), temperature source Tympanic, resp. rate 18, weight 207 lb 3.7 oz (94 kg), SpO2 97 %.   Physical Exam Vitals and nursing note reviewed.  Constitutional:      General: She is not in acute distress.    Appearance: She is well-developed. She is not diaphoretic.  HENT:     Head: Normocephalic and atraumatic.     Mouth/Throat:     Mouth: Mucous membranes are dry.     Pharynx: Oropharynx is clear. No oropharyngeal exudate.  Eyes:     General: No scleral icterus.    Conjunctiva/sclera: Conjunctivae normal.     Pupils: Pupils are equal, round, and reactive to light.     Comments: Glasses. Blue eyes.  Cardiovascular:     Rate and Rhythm: Normal rate and regular rhythm.     Pulses: Normal pulses.     Heart sounds: Normal heart sounds. No murmur heard.   Pulmonary:     Effort: Pulmonary effort is normal. No respiratory distress.     Breath sounds: Normal breath sounds. No wheezing or rales.  Chest:     Breasts:        Right: Skin change (eschar at 6 o clock) present. No swelling, inverted nipple, mass, nipple discharge or tenderness.        Left: No swelling, inverted nipple, mass, nipple discharge, skin change or tenderness.  Abdominal:     General: Bowel sounds are normal. There is no distension.     Palpations: Abdomen is soft. There is no mass.     Tenderness: There is no abdominal tenderness.  There is no guarding or rebound.  Musculoskeletal:        General: No swelling or tenderness. Normal range of motion.     Cervical back: Normal range of motion and neck supple.  Lymphadenopathy:     Head:     Right side of head: No preauricular, posterior auricular or occipital adenopathy.     Left side of head: No preauricular, posterior auricular or occipital adenopathy.  Cervical: No cervical adenopathy.     Upper Body:     Right upper body: No supraclavicular or axillary adenopathy.     Left upper body: No supraclavicular or axillary adenopathy.     Lower Body: No right inguinal adenopathy. No left inguinal adenopathy.  Skin:    General: Skin is warm and dry.  Neurological:     Mental Status: She is alert and oriented to person, place, and time.  Psychiatric:        Mood and Affect: Mood normal.        Behavior: Behavior normal.        Thought Content: Thought content normal.        Judgment: Judgment normal.    Appointment on 02/21/2020  Component Date Value Ref Range Status  . Sodium 02/21/2020 139  135 - 145 mmol/L Final  . Potassium 02/21/2020 4.0  3.5 - 5.1 mmol/L Final  . Chloride 02/21/2020 102  98 - 111 mmol/L Final  . CO2 02/21/2020 27  22 - 32 mmol/L Final  . Glucose, Bld 02/21/2020 135* 70 - 99 mg/dL Final   Glucose reference range applies only to samples taken after fasting for at least 8 hours.  . BUN 02/21/2020 17  8 - 23 mg/dL Final  . Creatinine, Ser 02/21/2020 1.04* 0.44 - 1.00 mg/dL Final  . Calcium 02/21/2020 9.7  8.9 - 10.3 mg/dL Final  . Total Protein 02/21/2020 7.2  6.5 - 8.1 g/dL Final  . Albumin 02/21/2020 3.9  3.5 - 5.0 g/dL Final  . AST 02/21/2020 95* 15 - 41 U/L Final  . ALT 02/21/2020 118* 0 - 44 U/L Final  . Alkaline Phosphatase 02/21/2020 128* 38 - 126 U/L Final  . Total Bilirubin 02/21/2020 1.1  0.3 - 1.2 mg/dL Final  . GFR, Estimated 02/21/2020 55* >60 mL/min Final  . Anion gap 02/21/2020 10  5 - 15 Final   Performed at [facility], [address] [place], [place] [address]  . WBC 02/21/2020 5.6  4.0 - 10.5 K/uL Final  . RBC 02/21/2020 4.47  3.87 - 5.11 MIL/uL Final  . Hemoglobin 02/21/2020 14.2  12.0 - 15.0 g/dL Final  . HCT 02/21/2020 42.8  36 - 46 % Final  . MCV 02/21/2020 95.7  80.0 - 100.0 fL Final  . MCH 02/21/2020 31.8  26.0 - 34.0 pg Final  . MCHC 02/21/2020 33.2  30.0 - 36.0 g/dL Final  . RDW 02/21/2020 14.8  11.5 - 15.5 % Final  . Platelets 02/21/2020 199  150 - 400 K/uL Final  . nRBC 02/21/2020 0.0  0.0 - 0.2 % Final  . Neutrophils Relative % 02/21/2020 68  % Final  . Neutro Abs 02/21/2020 3.8  1.7 - 7.7 K/uL Final  . Lymphocytes Relative 02/21/2020 14  % Final  . Lymphs Abs 02/21/2020 0.8  0.7 - 4.0 K/uL Final  . Monocytes Relative 02/21/2020 13  % Final  . Monocytes Absolute 02/21/2020 0.7  0.1 - 1.0 K/uL Final  . Eosinophils Relative 02/21/2020 4  % Final  . Eosinophils Absolute 02/21/2020 0.2  0.0 - 0.5 K/uL Final  . Basophils Relative 02/21/2020 1  % Final  . Basophils Absolute 02/21/2020 0.0  0.0 - 0.1 K/uL Final  . Immature Granulocytes 02/21/2020 0  % Final  . Abs Immature Granulocytes 02/21/2020 0.02  0.00 - 0.07 K/uL Final   Performed at [facility], [address], [place], [place] [address]    Assessment:  Jill Bond is a 69 y.o. female withstage IAright breast cancers/plumpectomy on 02/06/2019. Pathology([id number]) revealed a1.2 cm grade I invasive carcinoma of no special type. Low grade DCIS was present. Closest margin was 4 mm. One of threesentinellymph nodes were positive for micro metastatic carcinoma(>0.2 to 2 mm). Margins are negative for atypia and malignancy.Tumor was ER positive (>90%) PR positive (51-90%) and HER-2/neu negative (1+).Pathologic stagewas pT1c pN73mi.  Oncotype DX testingon 03/09/2019 revealed a recurrence score of 16 with a distant recurrence risk at 9 years of 15% (95% CI 10-19%) and no apparent benefit of  chemotherapy.   Bilateralscreening mammogramon 12/16/2018 revealed a mass in the right breast.There were no suspicious findings in the left breast.Right diagnosticmammogramand ultrasoundon 01/17/2019 revealed a1 x 1.2 x 1.1 cm hypoechoicmass in the right breast at the 9:30 position, 7 cm from the nipple. There were indeterminate calcifications in the upper-outerquadrant of theright breast. Lymph nodes were unremarkable.  Shereceived right breastradiationfrom11/01/2019-05/15/2019.  She began Femara on 06/29/2019.  Right diagnostic mammogram and ultrasound on 10/06/2019 revealed no suspicious abnormality was identified within the right breast.  There was indeterminate spontaneous clear and sometimes yellowish discharge from the right nipple. While this may be secondary to the seroma within the upper-outer right breast, no discrete connection from the seroma to the nipple was identified with ultrasound.   Bilateral breast MRI on 12/05/2019 revealed a 1 cm focal enhancement at the base of the right nipple.  This was not amenable to image-guided biopsy due to its far anterior, retroareolar location. A correlate was not identified sonographically on prior diagnostic workup. Recommendation was for surgical evaluation for possible excisional biopsy.  There were expected postsurgical and postradiation changes of the right breast.  There was no MRI evidence of malignancy on the left.  Right breast excisional biopsy on 01/19/2020 revealed focal duct ectasia with intraluminal inflammatory debris, usual ductal hyperplasia, and patchy stromal fibrosis. There was no atypia or malignancy.  CA27.29 has been followed: 27.8 on 01/31/2019, 25.8 on 06/13/2019 and 20.4 on 10/31/2019.  She hassarcoidosiss/p utrasound guided supraclavicular lymph node biopsy on 03/11/2016. Pathologyrevealed non-necrotizing granulomatousinflammation with fibrosis. There was no evidence of malignancy. GMS-fungal,  AFB and Gram stainswerenegative.She presented withsmall upper abdominal adenopathy,bibasilar lung nodes, a30 pound weight loss,some drenching night sweats, and mild hypercalcemia.   Abdominal and pelvic CTscan on 02/26/2016 revealed multiple new bibasilar lung nodules measuring up to 5 mm in size. There was new para-aortic and gastrohepatic lymph nodes measuring up to 1 cm in size, suspicious for metastatic disease. Abdomen and pelvis CT on 06/28/2019 revealed no acute abdominal/pelvic findings, mass lesions or adenopathy.  There was cholelithiasis and s/p nephrectomy with no findings for recurrent tumor or metastatic disease.  There was a small bulging midline pelvic hernia containing part of the dome of the bladder. There were stable small basilar pulmonary nodules.  She has a history ofmultiple angiomyolipomass/p left nephrectomy on 03/15/2006. Baseline creatinineis 0.9 - 1.1 (CrCl 50-63 ml/min).  Bone densityon 02/22/2019 was normal with a T-score of -0.5 at the right femur neck.   Patient has elevated LFTs.  Work-up by GI revealed hepatic steatosis.  Colonoscopyin 2013 revealed polyps (next scheduled 2018). She had a partial hysterectomy at age 69.  She tested positive for COVID-19 during the interim and lost her sense of taste and smell.   Symptomatically, she has been "better than she has been".  She still has a cough and dry mouth. She reports dysuria. She denies any breast concerns.  Exam reveals post=operative  changes.  Plan: 1.   Labs today: CBC with diff, CMP, CA 27.29. 2.Stage IA right breast cancer She is s/plumpectomyand SLN biopsyon 02/06/2019. Pathologic stage was T1cN5mi (stage IA) OncoType DX testingrevealed a recurrence score of 16. No benefit of chemotherapy. Patient s/p right breastradiation(completed on 05/15/2019). She began Femara on 06/29/2019.   She is  tolerating Femara well.  Nipple discharge prompted breast MRI and biopsy.   Biopsy on 01/19/2020 revealed no evidence of malignancy.   Confirm concordance with radiology. 3.Elevated LFTs             AST 64.  ALT   74.  Alkaline phosphatase 140 on 08/01/2019.  AST 50.  ALT   62.  Alkaline phosphatase 120 on 10/31/2019.  AST 95.  ALT 118.  Alkaline phosphatase 128 on 02/21/2020.             Hepatitis B and C serologies were negative.             Abdomen and pelvis CT on 06/28/2019.  Revealed no liver lesions or recurrent disease.  Patient has been seen by the [facility] clinic.   Forward labs to Dr. Alice Reichert.  Patient felt to have hepatic steatosis. 4.   Dysuria  UA and culture. 5.   RTC in 4 months for MD assessment and labs (CBC with diff, CMP, CA 27-29).  I discussed the assessment and treatment plan with the patient.  The patient was provided an opportunity to ask questions and all were answered.  The patient agreed with the plan and demonstrated an understanding of the instructions.  The patient was advised to call back if the symptoms worsen or if the condition fails to improve as anticipated.   Lequita Asal, MD, PhD    02/21/2020, 2:20 PM  I, Mirian Mo Tufford, am acting as a Education administrator for [employer]. Mike Gip, MD.   I, Melissa C. Mike Gip, MD, have reviewed the above documentation for accuracy and completeness, and I agree with the above.

## 2020-02-21 ENCOUNTER — Other Ambulatory Visit: Payer: Self-pay

## 2020-02-21 ENCOUNTER — Inpatient Hospital Stay: Payer: Medicare HMO | Attending: Hematology and Oncology

## 2020-02-21 ENCOUNTER — Inpatient Hospital Stay (HOSPITAL_BASED_OUTPATIENT_CLINIC_OR_DEPARTMENT_OTHER): Payer: Medicare HMO | Admitting: Hematology and Oncology

## 2020-02-21 ENCOUNTER — Encounter: Payer: Self-pay | Admitting: Hematology and Oncology

## 2020-02-21 VITALS — BP 150/78 | HR 86 | Temp 97.8°F | Resp 18 | Wt 207.2 lb

## 2020-02-21 DIAGNOSIS — C50911 Malignant neoplasm of unspecified site of right female breast: Secondary | ICD-10-CM | POA: Diagnosis not present

## 2020-02-21 DIAGNOSIS — Z8616 Personal history of COVID-19: Secondary | ICD-10-CM | POA: Diagnosis not present

## 2020-02-21 DIAGNOSIS — R300 Dysuria: Secondary | ICD-10-CM | POA: Insufficient documentation

## 2020-02-21 DIAGNOSIS — R748 Abnormal levels of other serum enzymes: Secondary | ICD-10-CM | POA: Insufficient documentation

## 2020-02-21 DIAGNOSIS — K760 Fatty (change of) liver, not elsewhere classified: Secondary | ICD-10-CM | POA: Diagnosis not present

## 2020-02-21 DIAGNOSIS — Z905 Acquired absence of kidney: Secondary | ICD-10-CM | POA: Diagnosis not present

## 2020-02-21 DIAGNOSIS — R7989 Other specified abnormal findings of blood chemistry: Secondary | ICD-10-CM | POA: Diagnosis not present

## 2020-02-21 DIAGNOSIS — Z9011 Acquired absence of right breast and nipple: Secondary | ICD-10-CM | POA: Diagnosis not present

## 2020-02-21 DIAGNOSIS — Z79811 Long term (current) use of aromatase inhibitors: Secondary | ICD-10-CM | POA: Diagnosis not present

## 2020-02-21 DIAGNOSIS — Z923 Personal history of irradiation: Secondary | ICD-10-CM | POA: Insufficient documentation

## 2020-02-21 DIAGNOSIS — Z170 Estrogen receptor positive status [ER+]: Secondary | ICD-10-CM | POA: Diagnosis not present

## 2020-02-21 DIAGNOSIS — Z96653 Presence of artificial knee joint, bilateral: Secondary | ICD-10-CM | POA: Insufficient documentation

## 2020-02-21 DIAGNOSIS — C50411 Malignant neoplasm of upper-outer quadrant of right female breast: Secondary | ICD-10-CM

## 2020-02-21 LAB — CBC WITH DIFFERENTIAL/PLATELET
Abs Immature Granulocytes: 0.02 10*3/uL (ref 0.00–0.07)
Basophils Absolute: 0 10*3/uL (ref 0.0–0.1)
Basophils Relative: 1 %
Eosinophils Absolute: 0.2 10*3/uL (ref 0.0–0.5)
Eosinophils Relative: 4 %
HCT: 42.8 % (ref 36.0–46.0)
Hemoglobin: 14.2 g/dL (ref 12.0–15.0)
Immature Granulocytes: 0 %
Lymphocytes Relative: 14 %
Lymphs Abs: 0.8 10*3/uL (ref 0.7–4.0)
MCH: 31.8 pg (ref 26.0–34.0)
MCHC: 33.2 g/dL (ref 30.0–36.0)
MCV: 95.7 fL (ref 80.0–100.0)
Monocytes Absolute: 0.7 10*3/uL (ref 0.1–1.0)
Monocytes Relative: 13 %
Neutro Abs: 3.8 10*3/uL (ref 1.7–7.7)
Neutrophils Relative %: 68 %
Platelets: 199 10*3/uL (ref 150–400)
RBC: 4.47 MIL/uL (ref 3.87–5.11)
RDW: 14.8 % (ref 11.5–15.5)
WBC: 5.6 10*3/uL (ref 4.0–10.5)
nRBC: 0 % (ref 0.0–0.2)

## 2020-02-21 LAB — COMPREHENSIVE METABOLIC PANEL
ALT: 118 U/L — ABNORMAL HIGH (ref 0–44)
AST: 95 U/L — ABNORMAL HIGH (ref 15–41)
Albumin: 3.9 g/dL (ref 3.5–5.0)
Alkaline Phosphatase: 128 U/L — ABNORMAL HIGH (ref 38–126)
Anion gap: 10 (ref 5–15)
BUN: 17 mg/dL (ref 8–23)
CO2: 27 mmol/L (ref 22–32)
Calcium: 9.7 mg/dL (ref 8.9–10.3)
Chloride: 102 mmol/L (ref 98–111)
Creatinine, Ser: 1.04 mg/dL — ABNORMAL HIGH (ref 0.44–1.00)
GFR, Estimated: 55 mL/min — ABNORMAL LOW (ref >60)
Glucose, Bld: 135 mg/dL — ABNORMAL HIGH (ref 70–99)
Potassium: 4 mmol/L (ref 3.5–5.1)
Sodium: 139 mmol/L (ref 135–145)
Total Bilirubin: 1.1 mg/dL (ref 0.3–1.2)
Total Protein: 7.2 g/dL (ref 6.5–8.1)

## 2020-02-21 LAB — URINALYSIS, COMPLETE (UACMP) WITH MICROSCOPIC
Bilirubin Urine: NEGATIVE
Glucose, UA: NEGATIVE mg/dL
Hgb urine dipstick: NEGATIVE
Nitrite: NEGATIVE
Specific Gravity, Urine: 1.025 (ref 1.005–1.030)
pH: 5.5 (ref 5.0–8.0)

## 2020-02-21 NOTE — Progress Notes (Signed)
Patient having burning with urination and the urge to pee but nothing comes out. Patient still has cough from COVID. No appetite due to loss of taste and smell from COVID

## 2020-02-22 LAB — CANCER ANTIGEN 27.29: CA 27.29: 55.2 U/mL — ABNORMAL HIGH (ref 0.0–38.6)

## 2020-02-23 LAB — URINE CULTURE: Culture: >=100000 — AB

## 2020-02-26 ENCOUNTER — Other Ambulatory Visit: Payer: Self-pay | Admitting: Hematology and Oncology

## 2020-02-26 ENCOUNTER — Telehealth: Payer: Self-pay

## 2020-02-26 DIAGNOSIS — N390 Urinary tract infection, site not specified: Secondary | ICD-10-CM

## 2020-02-26 DIAGNOSIS — B9620 Unspecified Escherichia coli [E. coli] as the cause of diseases classified elsewhere: Secondary | ICD-10-CM

## 2020-02-26 NOTE — Telephone Encounter (Signed)
Per Dr Mike Gip she wanted  Me to inform the patient that she a has a positive UTI and is she voiding well. The patient reports Yes the patient reports she is voiding well and she is ok to get Septra. The patient was understanding and agreeable.

## 2020-02-27 ENCOUNTER — Telehealth: Payer: Self-pay

## 2020-02-27 NOTE — Telephone Encounter (Signed)
I have tried faxing the patient labs to her PCP office and was not sucessfull. i have tried calling and have been on wait for 45-1 hour and never got anyone on the phone. I have routed over on letter head and labs to inform them Per Dr Mike Gip the patient has a positive UTI and need to be treated. Awaiting on a response from the PCP office. I was able to get back in touch with the office and I was provided with another fax number  [phone]. I have faxed and routed the labs to the patient PCP office.

## 2020-02-27 NOTE — Telephone Encounter (Signed)
The patient labs has been faxed over to her PCP office at 8:54 AM [phone]. To inform the provider that this patient is positive for a UTI and Per Dr Mike Gip the patient need to be treated.

## 2020-02-29 ENCOUNTER — Telehealth: Payer: Self-pay

## 2020-02-29 NOTE — Telephone Encounter (Signed)
Spoke with the nurse at Dr Edwina Barth office, He will treatment the patient with Cipro 250 mg 1 tab po BID x 7 days.

## 2020-03-11 ENCOUNTER — Telehealth: Payer: Self-pay | Admitting: *Deleted

## 2020-03-11 NOTE — Telephone Encounter (Signed)
Sent in basket to Dr. Alice Reichert about liver enzymes 02/21/2020. Message:I attached the labs from 10/13 specific attention to LFT's and routed it to Dr. Alice Reichert and asked him to please recheck the liver enzymes around 11/11 to 11/13 and if he had issues or questions to please reach out by inbasket or call [facility] directly and left phone number

## 2020-03-29 ENCOUNTER — Other Ambulatory Visit: Payer: Self-pay | Admitting: General Surgery

## 2020-03-29 DIAGNOSIS — C50411 Malignant neoplasm of upper-outer quadrant of right female breast: Secondary | ICD-10-CM

## 2020-04-15 ENCOUNTER — Ambulatory Visit
Admission: RE | Admit: 2020-04-15 | Discharge: 2020-04-15 | Disposition: A | Discharge status: Home / Self Care | Payer: Medicare HMO | Source: Ambulatory Visit | Attending: General Surgery | Admitting: General Surgery

## 2020-04-15 ENCOUNTER — Other Ambulatory Visit: Payer: Self-pay

## 2020-04-15 DIAGNOSIS — R928 Other abnormal and inconclusive findings on diagnostic imaging of breast: Secondary | ICD-10-CM | POA: Diagnosis not present

## 2020-04-15 DIAGNOSIS — C50411 Malignant neoplasm of upper-outer quadrant of right female breast: Secondary | ICD-10-CM

## 2020-04-15 DIAGNOSIS — Z853 Personal history of malignant neoplasm of breast: Secondary | ICD-10-CM | POA: Diagnosis not present

## 2020-04-25 ENCOUNTER — Other Ambulatory Visit: Payer: Self-pay | Admitting: General Surgery

## 2020-04-25 DIAGNOSIS — N6452 Nipple discharge: Secondary | ICD-10-CM | POA: Diagnosis not present

## 2020-05-02 ENCOUNTER — Other Ambulatory Visit: Payer: Self-pay

## 2020-05-02 ENCOUNTER — Ambulatory Visit
Admission: RE | Admit: 2020-05-02 | Discharge: 2020-05-02 | Disposition: A | Discharge status: Home / Self Care | Payer: Medicare HMO | Source: Ambulatory Visit | Attending: General Surgery | Admitting: General Surgery

## 2020-05-02 DIAGNOSIS — N6452 Nipple discharge: Secondary | ICD-10-CM | POA: Insufficient documentation

## 2020-05-07 ENCOUNTER — Other Ambulatory Visit: Payer: Self-pay | Admitting: General Surgery

## 2020-05-07 DIAGNOSIS — T888XXA Other specified complications of surgical and medical care, not elsewhere classified, initial encounter: Secondary | ICD-10-CM

## 2020-05-09 DIAGNOSIS — G4733 Obstructive sleep apnea (adult) (pediatric): Secondary | ICD-10-CM | POA: Diagnosis not present

## 2020-05-13 ENCOUNTER — Ambulatory Visit
Admission: RE | Admit: 2020-05-13 | Discharge: 2020-05-13 | Disposition: A | Discharge status: Home / Self Care | Payer: Medicare HMO | Source: Ambulatory Visit | Attending: General Surgery | Admitting: General Surgery

## 2020-05-13 ENCOUNTER — Other Ambulatory Visit: Payer: Self-pay

## 2020-05-13 DIAGNOSIS — T888XXA Other specified complications of surgical and medical care, not elsewhere classified, initial encounter: Secondary | ICD-10-CM | POA: Diagnosis not present

## 2020-05-13 DIAGNOSIS — L7682 Other postprocedural complications of skin and subcutaneous tissue: Secondary | ICD-10-CM | POA: Diagnosis not present

## 2020-05-21 ENCOUNTER — Other Ambulatory Visit: Payer: Self-pay | Admitting: Gastroenterology

## 2020-05-21 DIAGNOSIS — K760 Fatty (change of) liver, not elsewhere classified: Secondary | ICD-10-CM

## 2020-05-21 DIAGNOSIS — E119 Type 2 diabetes mellitus without complications: Secondary | ICD-10-CM | POA: Diagnosis not present

## 2020-05-21 DIAGNOSIS — Z800 Family history of malignant neoplasm of digestive organs: Secondary | ICD-10-CM | POA: Diagnosis not present

## 2020-05-21 DIAGNOSIS — D869 Sarcoidosis, unspecified: Secondary | ICD-10-CM | POA: Diagnosis not present

## 2020-05-21 DIAGNOSIS — R748 Abnormal levels of other serum enzymes: Secondary | ICD-10-CM

## 2020-05-23 DIAGNOSIS — N6452 Nipple discharge: Secondary | ICD-10-CM | POA: Diagnosis not present

## 2020-05-23 DIAGNOSIS — C50411 Malignant neoplasm of upper-outer quadrant of right female breast: Secondary | ICD-10-CM | POA: Diagnosis not present

## 2020-05-23 DIAGNOSIS — T888XXA Other specified complications of surgical and medical care, not elsewhere classified, initial encounter: Secondary | ICD-10-CM | POA: Diagnosis not present

## 2020-05-28 ENCOUNTER — Other Ambulatory Visit: Payer: Self-pay

## 2020-05-28 ENCOUNTER — Ambulatory Visit
Admission: RE | Admit: 2020-05-28 | Discharge: 2020-05-28 | Disposition: A | Discharge status: Home / Self Care | Payer: Medicare HMO | Source: Ambulatory Visit | Attending: Gastroenterology | Admitting: Gastroenterology

## 2020-05-28 DIAGNOSIS — K760 Fatty (change of) liver, not elsewhere classified: Secondary | ICD-10-CM | POA: Insufficient documentation

## 2020-05-28 DIAGNOSIS — K8020 Calculus of gallbladder without cholecystitis without obstruction: Secondary | ICD-10-CM | POA: Diagnosis not present

## 2020-05-28 DIAGNOSIS — R748 Abnormal levels of other serum enzymes: Secondary | ICD-10-CM

## 2020-05-29 ENCOUNTER — Ambulatory Visit
Admission: RE | Admit: 2020-05-29 | Discharge: 2020-05-29 | Disposition: A | Discharge status: Home / Self Care | Payer: Medicare HMO | Source: Ambulatory Visit | Attending: Radiation Oncology | Admitting: Radiation Oncology

## 2020-05-29 ENCOUNTER — Encounter: Payer: Self-pay | Admitting: Radiation Oncology

## 2020-05-29 VITALS — BP 144/83 | HR 86 | Temp 97.6°F | Resp 16 | Wt 223.4 lb

## 2020-05-29 DIAGNOSIS — Z923 Personal history of irradiation: Secondary | ICD-10-CM | POA: Insufficient documentation

## 2020-05-29 DIAGNOSIS — Z79811 Long term (current) use of aromatase inhibitors: Secondary | ICD-10-CM | POA: Insufficient documentation

## 2020-05-29 DIAGNOSIS — C50411 Malignant neoplasm of upper-outer quadrant of right female breast: Secondary | ICD-10-CM | POA: Insufficient documentation

## 2020-05-29 DIAGNOSIS — Z170 Estrogen receptor positive status [ER+]: Secondary | ICD-10-CM | POA: Diagnosis not present

## 2020-05-29 NOTE — Progress Notes (Signed)
Radiation Oncology Follow up Note  Name: Jill Bond   Date:   05/29/2020 MRN:  [id number] DOB: [birth year]    This 70 y.o. female presents to the clinic today for 1 year follow-up status post whole breast radiation to her right breast and peripheral lymphatics for stage T1CN1 invasive mammary carcinoma ER/PR positive.  REFERRING PROVIDER: Baxter Hire, MD  HPI: Patient is a 70 year old female now at 1 year having completed whole breast and peripheral lymphatic radiation therapy for a T1N1 invasive mammary carcinoma ER/PR positive..  Patient has undergone bilateral breast MRI back in July showing a 1 cm focal enhancement at the base of the right nipple.  In September she had a right breast excisional biopsy showed focal duct ectasia and intraluminal inflammatory debris no evidence of atypia or malignancy.  Patient is also developed some fluid collection in the post anterior right retroareolar region secondary to her recent reexcision.  That was aspirated yesterday.  She is seen today and is doing fine still has some tenderness in the right breast.  Ultrasound shows a 2.7 cm retroareolar collection of fluid.  Patient is currently on letrozole tolerating it well without side effect.  COMPLICATIONS OF TREATMENT: none  FOLLOW UP COMPLIANCE: keeps appointments   PHYSICAL EXAM:  BP (!) 144/83 (BP Location: Left Wrist, Patient Position: Sitting, Cuff Size: Small)   Pulse 86   Temp 97.6 F (36.4 C) (Tympanic)   Resp 16   Wt 223 lb 6.4 oz (101.3 kg)   BMI 39.57 kg/m  Patient is status post lumpectomy.  There is some tenderness around the retroareolar region.  No dominant mass or nodularity is noted in either breast.  No axillary or supraclavicular adenopathy is identified.  Well-developed well-nourished patient in NAD. HEENT reveals PERLA, EOMI, discs not visualized.  Oral cavity is clear. No oral mucosal lesions are identified. Neck is clear without evidence of cervical or supraclavicular  adenopathy. Lungs are clear to A&P. Cardiac examination is essentially unremarkable with regular rate and rhythm without murmur rub or thrill. Abdomen is benign with no organomegaly or masses noted. Motor sensory and DTR levels are equal and symmetric in the upper and lower extremities. Cranial nerves II through XII are grossly intact. Proprioception is intact. No peripheral adenopathy or edema is identified. No motor or sensory levels are noted. Crude visual fields are within normal range.  RADIOLOGY RESULTS: MRI scans ultrasounds and mammograms are all reviewed compatible with above-stated findings  PLAN: Present time patient continues to do well with no evidence of disease.  Excisional biopsy was negative for atypia or malignancy.  She did develop some fluid collection secondary to that surgery which is being aspirated.  I have asked to see her back in 1 year for follow-up.  Patient knows to call with any concerns.  She continues on letrozole without side effect.  I would like to take this opportunity to thank you for allowing me to participate in the care of your patient.Noreene Filbert, MD

## 2020-06-11 DIAGNOSIS — N1832 Chronic kidney disease, stage 3b: Secondary | ICD-10-CM | POA: Diagnosis not present

## 2020-06-11 DIAGNOSIS — R8290 Unspecified abnormal findings in urine: Secondary | ICD-10-CM | POA: Diagnosis not present

## 2020-06-11 DIAGNOSIS — E7800 Pure hypercholesterolemia, unspecified: Secondary | ICD-10-CM | POA: Diagnosis not present

## 2020-06-11 DIAGNOSIS — R739 Hyperglycemia, unspecified: Secondary | ICD-10-CM | POA: Diagnosis not present

## 2020-06-18 DIAGNOSIS — M6530 Trigger finger, unspecified finger: Secondary | ICD-10-CM | POA: Diagnosis not present

## 2020-06-18 DIAGNOSIS — Z0001 Encounter for general adult medical examination with abnormal findings: Secondary | ICD-10-CM | POA: Diagnosis not present

## 2020-06-18 DIAGNOSIS — N1832 Chronic kidney disease, stage 3b: Secondary | ICD-10-CM | POA: Diagnosis not present

## 2020-06-18 DIAGNOSIS — D860 Sarcoidosis of lung: Secondary | ICD-10-CM | POA: Diagnosis not present

## 2020-06-18 DIAGNOSIS — R739 Hyperglycemia, unspecified: Secondary | ICD-10-CM | POA: Diagnosis not present

## 2020-06-18 DIAGNOSIS — I129 Hypertensive chronic kidney disease with stage 1 through stage 4 chronic kidney disease, or unspecified chronic kidney disease: Secondary | ICD-10-CM | POA: Diagnosis not present

## 2020-06-18 DIAGNOSIS — Z853 Personal history of malignant neoplasm of breast: Secondary | ICD-10-CM | POA: Diagnosis not present

## 2020-06-18 DIAGNOSIS — Z0000 Encounter for general adult medical examination without abnormal findings: Secondary | ICD-10-CM | POA: Diagnosis not present

## 2020-06-18 DIAGNOSIS — Z85528 Personal history of other malignant neoplasm of kidney: Secondary | ICD-10-CM | POA: Diagnosis not present

## 2020-06-19 DIAGNOSIS — M65331 Trigger finger, right middle finger: Secondary | ICD-10-CM | POA: Diagnosis not present

## 2020-06-24 NOTE — Progress Notes (Signed)
[facility]  [address], [address] [place], [place] [address] Phone: [phone]  Fax: [phone]   Clinic Day:  06/25/2020  Referring physician: Gracelyn Nurse, MD  Chief Complaint: Jill Bond is a 70 y.o. female with sarcoid andstage IAright breastcancer who is seen for 4 month assessment.  HPI: The patient was last seen in the medical oncology clinic on 02/21/2020. At that time, she had been "better than she has been".  She still had a cough and dry mouth. She reported dysuria. She denied any breast concerns.  Exam revealed post-operative changes. Hematocrit 42.8, hemoglobin 14.2, platelets 199,000, WBC 5,600. Creatinine was 1.04 (CrCl 55 ml/min). AST was 95, ALT 118, alkaline phosphatase 128.  CA27.29 was 55.2. UA showed trace ketones, trace protein, small leukocytes, and few bacteria; culture showed E. Coli.  Bilateral diagnostic mammogram on 04/15/2020 revealed expected postsurgical changes in the right breast following prior lumpectomy and excisional biopsy. There was interval decrease in size of a seroma at the lumpectomy site. There was no mammographic evidence of malignancy in the bilateral breasts.  Right breast ultrasound on 05/02/2020 revealed a 2.7 x 1.4 x 2.2 cm postoperative collection within the retroareolar right breast. Right breast aspiration on 05/13/2020 yielded 4 cc of fluid.  The patient saw Wylie Hail, [place] on 05/21/2020. Abdominal ultrasound showed hepatic steatosis. There was no focal liver lesion. There was cholelithiasis. She was status post left nephrectomy.  The patient saw Dr. Letitia Libra on 06/18/2020. She had run out of her methotrexate. Her pharmacy had tried to get refills from the wrong pulmonologist. She still has not received Methotrexate.  During the interim, she has been "alright." She has been off methotrexate for 8 weeks. The patient states that somebody is supposed to be calling her to schedule a  colonoscopy.  Her mouth is still dry. She needs to see the eye doctor. The last time her eyes were checked was 2 years ago. She had back pain a couple of weeks ago and thinks she may have pulled a muscle. She was recently put on antibiotics for a UTI. She has knee pain and trigger finger.  She denies shortness of breath, nausea, vomiting, diarrhea, constipation, blood in the stools, black stools, and pain. Her cough has resolved.   The patient currently lives with her brother in law who has Alzheimer's. The patient does not get a lot of sleep because she is worried about him falling.  The patient takes 1 calcium pill per day. She will discontinue it. She takes Femara as prescribed.   Past Medical History:  Diagnosis Date  . Anxiety   . Arthritis   . Breast cancer (HCC) 01/2019   invasive mammary carcinoma   . Chronic kidney disease 2006   Left Nephrectomy  . Complication of anesthesia    PONV  . Constipation   . Depression   . GERD (gastroesophageal reflux disease)   . Headache    migraines  . Hypertension   . Personal history of radiation therapy   . PONV (postoperative nausea and vomiting)   . Rotator cuff disorder    LEFT  . Sarcoidosis   . Shortness of breath dyspnea    with exertion  . Sleep apnea   . Trigger finger, right     Past Surgical History:  Procedure Laterality Date  . ABDOMINAL HYSTERECTOMY     PARTIAL  . ACHILLES TENDON SURGERY Left 02/22/2015   Procedure: Secondary ACHILLES TENDON REPAIR;  Surgeon: Recardo Evangelist, DPM;  Location: [facility] ORS;  Service: Podiatry;  Laterality: Left;  . Bladder tac    . BREAST BIOPSY Right 01/23/2019   affirm stereo bx of calcs with x marker, ductal hyperplasia  . BREAST BIOPSY Right 01/23/2019   [place] bx of mass with coil marker, invasive mammary carcinoma/DCIS  . BREAST EXCISIONAL BIOPSY Left 2003   neg  . BREAST LUMPECTOMY Right 02/06/2019   IMC/DCIS clear margins, ONE LYMPH NODE WITH 1.2 MM FOCUS OF MICRO METASTATIC  CARCINOMA  . COLONOSCOPY  2013  . EXCISION / BIOPSY BREAST / NIPPLE / DUCT Right 01/19/2020   duct excision due to nipple d/c, Usual ductal hyperplasia, patchy stromal fibrosis. No atypia or malignancy  . EXCISION OF BREAST BIOPSY Right 01/19/2020   Procedure: EXCISION OF BREAST BIOPSY;  Surgeon: Herbert Pun, MD;  Location: [facility] ORS;  Service: General;  Laterality: Right;  . JOINT REPLACEMENT Left 2007   Total Knee Replacement  . KIDNEY CYST REMOVAL Left   . NEPHRECTOMY Left   . OSTECTOMY Left 02/22/2015   Procedure: OSTECTOMY;  Surgeon: Albertine Patricia, DPM;  Location: [facility] ORS;  Service: Podiatry;  Laterality: Left;  . PARTIAL MASTECTOMY WITH NEEDLE LOCALIZATION AND AXILLARY SENTINEL LYMPH NODE BX Right 02/06/2019   Procedure: PARTIAL MASTECTOMY WITH NEEDLE LOCALIZATION AND AXILLARY SENTINEL LYMPH NODE BX;  Surgeon: Herbert Pun, MD;  Location: [facility] ORS;  Service: General;  Laterality: Right;  . SHOULDER ARTHROSCOPY W/ ROTATOR CUFF REPAIR Left YRS AGO  . TONSILLECTOMY  AGE 37  . TOTAL KNEE ARTHROPLASTY Right 10/22/2015   Procedure: RIGHT TOTAL KNEE ARTHROPLASTY;  Surgeon: Paralee Cancel, MD;  Location: [facility];  Service: Orthopedics;  Laterality: Right;  . TOTAL KNEE ARTHROPLASTY Right    10/22/2015    Family History  Problem Relation Age of Onset  . Breast cancer Mother 70  . Heart failure Father   . Prostate cancer Father     Social History:  reports that she has never smoked. She has never used smokeless tobacco. She reports that she does not drink alcohol and does not use drugs. She denies any exposure to radiation or toxins. 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 Her husband died on 03-Oct-2013.Her first cousinis [name]([phone]).Her daughter, Pat,is her medical power of attorney. The patient is alone today.   Allergies:  Allergies  Allergen Reactions  . Codeine Nausea And Vomiting  . Penicillins Hives and Other (See Comments)     Has patient had a PCN reaction causing immediate rash, facial/tongue/throat swelling, SOB or lightheadedness with hypotension: no Has patient had a PCN reaction causing severe rash involving mucus membranes or skin necrosis: no Has patient had a PCN reaction that required hospitalization no Has patient had a PCN reaction occurring within the last 10 years: no If all of the above answers are "NO", then may proceed with Cephalosporin use.     Current Medications: Current Outpatient Medications  Medication Sig Dispense Refill  . amLODipine (NORVASC) 10 MG tablet Take 10 mg by mouth at bedtime.    Marland Kitchen aspirin-acetaminophen-caffeine (MIGRAINE RELIEF) 250-250-65 MG tablet Take 1 tablet by mouth every 6 (six) hours as needed for headache.    . cyclobenzaprine (FLEXERIL) 10 MG tablet Take 1 tablet by mouth 2 (two) times daily.     . diclofenac sodium (VOLTAREN) 1 % GEL Apply 1 application topically 2 (two) times daily as needed (foot pain).    Marland Kitchen docusate sodium (COLACE) 100 MG capsule Take 300 mg by mouth at  bedtime.    Marland Kitchen letrozole (FEMARA) 2.5 MG tablet TAKE 1 TABLET BY MOUTH EVERY DAY (Patient taking differently: Take 2.5 mg by mouth daily.) 90 tablet 1  . methotrexate (RHEUMATREX) 2.5 MG tablet TAKE 4 TABLETS (10 MG TOTAL) BY MOUTH ONCE A WEEK. CAUTION:CHEMOTHERAPY. PROTECT FROM LIGHT. (Patient taking differently: Take 10 mg by mouth every Sunday. Caution:Chemotherapy. Protect from light.) 52 tablet 2  . metoprolol (TOPROL-XL) 200 MG 24 hr tablet Take 100 mg by mouth daily.    . Multiple Vitamin (MULTIVITAMIN WITH MINERALS) TABS tablet Take 1 tablet by mouth daily.     Marland Kitchen nystatin (MYCOSTATIN/NYSTOP) powder Apply 1 application topically 3 (three) times daily. 15 g 0  . Omega-3 Fatty Acids (OMEGA 3 500) 500 MG CAPS Take 500 mg by mouth daily.    Marland Kitchen omeprazole (PRILOSEC) 20 MG capsule Take 20 mg by mouth daily.     . ondansetron (ZOFRAN) 4 MG tablet Take 1 tablet (4 mg total) by mouth every 8 (eight)  hours as needed for nausea or vomiting. 10 tablet 0  . silver sulfADIAZINE (SILVADENE) 1 % cream Apply 1 application topically daily as needed (skin irritation).    . vitamin E 180 MG (400 UNITS) capsule Take 800 Units by mouth daily.     No current facility-administered medications for this visit.    Review of Systems  Constitutional: Negative.  Negative for chills, diaphoresis, fever, malaise/fatigue and weight loss (up 6 lbs).       Feels "alright."  HENT: Negative for congestion, ear discharge, ear pain, hearing loss, nosebleeds, sinus pain, sore throat and tinnitus.        Dry mouth.  Eyes: Negative for blurred vision.       Needs eyes checked  Respiratory: Negative for cough, hemoptysis, sputum production and shortness of breath.        Sleep apnea. Uses CPAP sometimes.  Cardiovascular: Negative.  Negative for chest pain, palpitations and leg swelling.  Gastrointestinal: Negative.  Negative for abdominal pain, blood in stool, constipation, diarrhea, heartburn, melena, nausea and vomiting.  Genitourinary: Negative.  Negative for dysuria, frequency, hematuria and urgency.  Musculoskeletal: Positive for joint pain (knees, trigger finger). Negative for back pain, myalgias and neck pain.  Skin: Negative.  Negative for itching and rash.  Neurological: Negative.  Negative for dizziness, tingling, sensory change, weakness and headaches.  Endo/Heme/Allergies: Negative.  Does not bruise/bleed easily.  Psychiatric/Behavioral: Positive for memory loss. Negative for depression. The patient has insomnia. The patient is not nervous/anxious.   All other systems reviewed and are negative.   Performance status (ECOG): 1  Vitals Blood pressure (!) 153/79, pulse 75, temperature 97.9 F (36.6 C), temperature source Tympanic, resp. rate 18, weight 213 lb 10 oz (96.9 kg), SpO2 100 %.   Physical Exam Vitals and nursing note reviewed.  Constitutional:      General: She is not in acute distress.     Appearance: She is well-developed. She is not diaphoretic.  HENT:     Head: Normocephalic and atraumatic.     Mouth/Throat:     Mouth: Mucous membranes are moist.     Pharynx: Oropharynx is clear. No oropharyngeal exudate.  Eyes:     General: No scleral icterus.    Conjunctiva/sclera: Conjunctivae normal.     Pupils: Pupils are equal, round, and reactive to light.     Comments: Glasses. Blue eyes.  Cardiovascular:     Rate and Rhythm: Normal rate and regular rhythm.     Pulses:  Normal pulses.     Heart sounds: Normal heart sounds. No murmur heard.   Pulmonary:     Effort: Pulmonary effort is normal. No respiratory distress.     Breath sounds: Normal breath sounds. No wheezing or rales.  Chest:  Breasts:     Right: Swelling (edema inferiorly), skin change (radiation changes inferiorly; scarring at 9 o clock, 4-5 cm from the nipple, with additional scarring superiorly) and tenderness present. No mass, axillary adenopathy or supraclavicular adenopathy.     Left: Skin change (minimal fibrocystic changes inferiorly) and tenderness present. No swelling, mass, axillary adenopathy or supraclavicular adenopathy.    Abdominal:     General: Bowel sounds are normal. There is no distension.     Palpations: Abdomen is soft. There is no mass.     Tenderness: There is no abdominal tenderness. There is no guarding or rebound.  Musculoskeletal:        General: Tenderness (base of neck/upper back, chronic) present. No swelling. Normal range of motion.     Cervical back: Normal range of motion and neck supple.  Lymphadenopathy:     Head:     Right side of head: No preauricular, posterior auricular or occipital adenopathy.     Left side of head: No preauricular, posterior auricular or occipital adenopathy.     Cervical: No cervical adenopathy.     Upper Body:     Right upper body: No supraclavicular or axillary adenopathy.     Left upper body: No supraclavicular or axillary adenopathy.     Lower  Body: No right inguinal adenopathy. No left inguinal adenopathy.  Skin:    General: Skin is warm and dry.  Neurological:     Mental Status: She is alert and oriented to person, place, and time.  Psychiatric:        Mood and Affect: Mood normal.        Behavior: Behavior normal.        Thought Content: Thought content normal.        Judgment: Judgment normal.    Appointment on 06/25/2020  Component Date Value Ref Range Status  . Sodium 06/25/2020 139  135 - 145 mmol/L Final  . Potassium 06/25/2020 4.0  3.5 - 5.1 mmol/L Final  . Chloride 06/25/2020 100  98 - 111 mmol/L Final  . CO2 06/25/2020 24  22 - 32 mmol/L Final  . Glucose, Bld 06/25/2020 130* 70 - 99 mg/dL Final   Glucose reference range applies only to samples taken after fasting for at least 8 hours.  . BUN 06/25/2020 19  8 - 23 mg/dL Final  . Creatinine, Ser 06/25/2020 1.01* 0.44 - 1.00 mg/dL Final  . Calcium 06/25/2020 10.7* 8.9 - 10.3 mg/dL Final  . Total Protein 06/25/2020 7.7  6.5 - 8.1 g/dL Final  . Albumin 06/25/2020 3.8  3.5 - 5.0 g/dL Final  . AST 06/25/2020 43* 15 - 41 U/L Final  . ALT 06/25/2020 45* 0 - 44 U/L Final  . Alkaline Phosphatase 06/25/2020 141* 38 - 126 U/L Final  . Total Bilirubin 06/25/2020 0.9  0.3 - 1.2 mg/dL Final  . GFR, Estimated 06/25/2020 >60  >60 mL/min Final   Comment: (NOTE) Calculated using the CKD-EPI Creatinine Equation (2021)   . Anion gap 06/25/2020 15  5 - 15 Final   Performed at [facility], [address], [place], [place] [address]  . WBC 06/25/2020 6.2  4.0 - 10.5 K/uL Final  . RBC 06/25/2020 4.63  3.87 -  5.11 MIL/uL Final  . Hemoglobin 06/25/2020 14.3  12.0 - 15.0 g/dL Final  . HCT 06/25/2020 43.9  36.0 - 46.0 % Final  . MCV 06/25/2020 94.8  80.0 - 100.0 fL Final  . MCH 06/25/2020 30.9  26.0 - 34.0 pg Final  . MCHC 06/25/2020 32.6  30.0 - 36.0 g/dL Final  . RDW 06/25/2020 13.2  11.5 - 15.5 % Final  . Platelets 06/25/2020 248  150 - 400 K/uL Final  . nRBC  06/25/2020 0.0  0.0 - 0.2 % Final  . Neutrophils Relative % 06/25/2020 70  % Final  . Neutro Abs 06/25/2020 4.4  1.7 - 7.7 K/uL Final  . Lymphocytes Relative 06/25/2020 11  % Final  . Lymphs Abs 06/25/2020 0.7  0.7 - 4.0 K/uL Final  . Monocytes Relative 06/25/2020 12  % Final  . Monocytes Absolute 06/25/2020 0.8  0.1 - 1.0 K/uL Final  . Eosinophils Relative 06/25/2020 6  % Final  . Eosinophils Absolute 06/25/2020 0.4  0.0 - 0.5 K/uL Final  . Basophils Relative 06/25/2020 1  % Final  . Basophils Absolute 06/25/2020 0.0  0.0 - 0.1 K/uL Final  . Immature Granulocytes 06/25/2020 0  % Final  . Abs Immature Granulocytes 06/25/2020 0.02  0.00 - 0.07 K/uL Final   Performed at [facility], [address], [place], [place] [address]    Assessment:  SHAKERRA RED is a 70 y.o. female withstage IAright breast cancers/plumpectomy on 02/06/2019. Pathology([id number]) revealed a1.2 cm grade I invasive carcinoma of no special type. Low grade DCIS was present. Closest margin was 4 mm. One of threesentinellymph nodes were positive for micro metastatic carcinoma(>0.2 to 2 mm). Margins are negative for atypia and malignancy.Tumor was ER positive (>90%) PR positive (51-90%) and HER-2/neu negative (1+).Pathologic stagewas pT1c pN13mi.  Oncotype DX testingon 03/09/2019 revealed a recurrence score of 16 with a distant recurrence risk at 9 years of 15% (95% CI 10-19%) and no apparent benefit of chemotherapy.   Bilateralscreening mammogramon 12/16/2018 revealed a mass in the right breast.There were no suspicious findings in the left breast.Right diagnosticmammogramand ultrasoundon 01/17/2019 revealed a1 x 1.2 x 1.1 cm hypoechoicmass in the right breast at the 9:30 position, 7 cm from the nipple. There were indeterminate calcifications in the upper-outerquadrant of theright breast. Lymph nodes were unremarkable.  Shereceived right  breastradiationfrom11/01/2019-05/15/2019.  She began Femara on 06/29/2019.  Right diagnostic mammogram and ultrasound on 10/06/2019 revealed no suspicious abnormality was identified within the right breast.  There was indeterminate spontaneous clear and sometimes yellowish discharge from the right nipple. While this may be secondary to the seroma within the upper-outer right breast, no discrete connection from the seroma to the nipple was identified with ultrasound.   Bilateral breast MRI on 12/05/2019 revealed a 1 cm focal enhancement at the base of the right nipple.  This was not amenable to image-guided biopsy due to its far anterior, retroareolar location. A correlate was not identified sonographically on prior diagnostic workup. Recommendation was for surgical evaluation for possible excisional biopsy.  There were expected postsurgical and postradiation changes of the right breast.  There was no MRI evidence of malignancy on the left.  Right breast excisional biopsy on 01/19/2020 revealed focal duct ectasia with intraluminal inflammatory debris, usual ductal hyperplasia, and patchy stromal fibrosis. There was no atypia or malignancy.  CA27.29 has been followed: 27.8 on 01/31/2019, 25.8 on 06/13/2019, 20.4 on 10/31/2019, and 55.2 on 02/21/2020.  She hassarcoidosiss/p utrasound guided supraclavicular lymph node biopsy on  03/11/2016. Pathologyrevealed non-necrotizing granulomatousinflammation with fibrosis. There was no evidence of malignancy. GMS-fungal, AFB and Gram stainswerenegative.She presented withsmall upper abdominal adenopathy,bibasilar lung nodes, a30 pound weight loss,some drenching night sweats, and mild hypercalcemia.   Abdominal and pelvic CTscan on 02/26/2016 revealed multiple new bibasilar lung nodules measuring up to 5 mm in size. There was new para-aortic and gastrohepatic lymph nodes measuring up to 1 cm in size, suspicious for metastatic disease. Abdomen and  pelvis CT on 06/28/2019 revealed no acute abdominal/pelvic findings, mass lesions or adenopathy.  There was cholelithiasis and s/p nephrectomy with no findings for recurrent tumor or metastatic disease.  There was a small bulging midline pelvic hernia containing part of the dome of the bladder. There were stable small basilar pulmonary nodules.  She has a history ofmultiple angiomyolipomass/p left nephrectomy on 03/15/2006. Baseline creatinineis 0.9 - 1.1 (CrCl 50-63 ml/min).  Bone densityon 02/22/2019 was normal with a T-score of -0.5 at the right femur neck.   Patient has elevated LFTs.  Work-up by GI revealed hepatic steatosis.  Colonoscopyin 2013 revealed polyps (next scheduled 2018). She had a partial hysterectomy at age 10.  The patient has not received the COVID-19 vaccine.  Symptomatically, feels "alright." She denies shortness of breath, nausea, vomiting, diarrhea, constipation, blood in the stools, black stools, and pain. Cough has resolved.  She has poor sleep as her brother-in-law who has Alzheimer's lives with her.  She denies any breast concerns.  Exam is stable.  Calcium is 10.7 (corrected 10.86).  Plan: 1.   Labs today: CBC with diff, CMP, CA 27.29. 2.Stage IA right breast cancer She is s/plumpectomyand SLN biopsyon 02/06/2019. Pathologic stage was T1cN85mi (stage IA) OncoType DX testingrevealed a recurrence score of 16. No benefit of chemotherapy. Patient s/p right breastradiation(completed on 05/15/2019). Continue Femara (began on 06/29/2019).  Nipple discharge prompted breast MRI and biopsy.   Biopsy on 01/19/2020 revealed no evidence of malignancy.  Symptomatically, she voices no concerns.    Exam reveals no evidence of recurrent disease 3.Elevated LFTs             AST 64.  ALT   74.  Alkaline phosphatase 140 on 08/01/2019.  AST 50.  ALT   62.  Alkaline  phosphatase 120 on 10/31/2019.  AST 95.  ALT 118.  Alkaline phosphatase 128 on 02/21/2020.   AST 43.    ALT 45.  Alkaline phosphatase 141 on 06/25/2020.             Hepatitis B and C serologies were negative.             Abdomen and pelvis CT on 06/28/2019 revealed no liver lesions or recurrent disease.  Patient has been seen by the GI [facility] clinic.  Patient felt to have hepatic steatosis. 4.   Hypercalcemia  Calcium 10.7 with an albumin of 3.8 today  Labs today: PTH, PTH-rp, vitamin D 125 dihydroxy.  Ensure patient is not taking oral calcium.  Etiology may be secondary to sarcoid. 5.   RN:  Call patient with hypercalcemia work-up and CA27.29. 6.   MD:  Notes to Dr Lanney Gins and Dr Alice Reichert. 7.   Preauth: Zometa. 8.   RTC in 1 week for MD assess, review of work-up, and discussion regarding direction of therapy.  I discussed the assessment and treatment plan with the patient.  The patient was provided an opportunity to ask questions and all were answered.  The patient agreed with the plan and demonstrated an understanding of the instructions.  The  patient was advised to call back if the symptoms worsen or if the condition fails to improve as anticipated.  I provided 30 minutes of face-to-face time during this this encounter and > 50% was spent counseling as documented under my assessment and plan.  An additional 10 minutes were spent reviewing her chart (Epic and Care Everywhere) including notes, labs, and imaging studies.    Lequita Asal, MD, PhD    06/25/2020, 2:07 PM  I, Mirian Mo Tufford, am acting as a Education administrator for [employer]. Mike Gip, MD.  I, Keimon Basaldua C. Mike Gip, MD, have reviewed the above documentation for accuracy and completeness, and I agree with the above.

## 2020-06-25 ENCOUNTER — Other Ambulatory Visit: Payer: Self-pay

## 2020-06-25 ENCOUNTER — Encounter: Payer: Self-pay | Admitting: Hematology and Oncology

## 2020-06-25 ENCOUNTER — Inpatient Hospital Stay: Payer: Medicare HMO | Attending: Hematology and Oncology

## 2020-06-25 ENCOUNTER — Inpatient Hospital Stay (HOSPITAL_BASED_OUTPATIENT_CLINIC_OR_DEPARTMENT_OTHER): Payer: Medicare HMO | Admitting: Hematology and Oncology

## 2020-06-25 VITALS — BP 153/79 | HR 75 | Temp 97.9°F | Resp 18 | Wt 213.6 lb

## 2020-06-25 DIAGNOSIS — Z170 Estrogen receptor positive status [ER+]: Secondary | ICD-10-CM

## 2020-06-25 DIAGNOSIS — R7989 Other specified abnormal findings of blood chemistry: Secondary | ICD-10-CM | POA: Diagnosis not present

## 2020-06-25 DIAGNOSIS — C50411 Malignant neoplasm of upper-outer quadrant of right female breast: Secondary | ICD-10-CM | POA: Insufficient documentation

## 2020-06-25 LAB — COMPREHENSIVE METABOLIC PANEL
ALT: 45 U/L — ABNORMAL HIGH (ref 0–44)
AST: 43 U/L — ABNORMAL HIGH (ref 15–41)
Albumin: 3.8 g/dL (ref 3.5–5.0)
Alkaline Phosphatase: 141 U/L — ABNORMAL HIGH (ref 38–126)
Anion gap: 15 (ref 5–15)
BUN: 19 mg/dL (ref 8–23)
CO2: 24 mmol/L (ref 22–32)
Calcium: 10.7 mg/dL — ABNORMAL HIGH (ref 8.9–10.3)
Chloride: 100 mmol/L (ref 98–111)
Creatinine, Ser: 1.01 mg/dL — ABNORMAL HIGH (ref 0.44–1.00)
GFR, Estimated: >60 mL/min (ref >60)
Glucose, Bld: 130 mg/dL — ABNORMAL HIGH (ref 70–99)
Potassium: 4 mmol/L (ref 3.5–5.1)
Sodium: 139 mmol/L (ref 135–145)
Total Bilirubin: 0.9 mg/dL (ref 0.3–1.2)
Total Protein: 7.7 g/dL (ref 6.5–8.1)

## 2020-06-25 LAB — CBC WITH DIFFERENTIAL/PLATELET
Abs Immature Granulocytes: 0.02 10*3/uL (ref 0.00–0.07)
Basophils Absolute: 0 10*3/uL (ref 0.0–0.1)
Basophils Relative: 1 %
Eosinophils Absolute: 0.4 10*3/uL (ref 0.0–0.5)
Eosinophils Relative: 6 %
HCT: 43.9 % (ref 36.0–46.0)
Hemoglobin: 14.3 g/dL (ref 12.0–15.0)
Immature Granulocytes: 0 %
Lymphocytes Relative: 11 %
Lymphs Abs: 0.7 10*3/uL (ref 0.7–4.0)
MCH: 30.9 pg (ref 26.0–34.0)
MCHC: 32.6 g/dL (ref 30.0–36.0)
MCV: 94.8 fL (ref 80.0–100.0)
Monocytes Absolute: 0.8 10*3/uL (ref 0.1–1.0)
Monocytes Relative: 12 %
Neutro Abs: 4.4 10*3/uL (ref 1.7–7.7)
Neutrophils Relative %: 70 %
Platelets: 248 10*3/uL (ref 150–400)
RBC: 4.63 MIL/uL (ref 3.87–5.11)
RDW: 13.2 % (ref 11.5–15.5)
WBC: 6.2 10*3/uL (ref 4.0–10.5)
nRBC: 0 % (ref 0.0–0.2)

## 2020-06-25 NOTE — Patient Instructions (Signed)
  Stop calcium.

## 2020-06-26 ENCOUNTER — Other Ambulatory Visit: Payer: Medicare HMO

## 2020-06-26 ENCOUNTER — Inpatient Hospital Stay: Payer: Medicare HMO

## 2020-06-26 ENCOUNTER — Other Ambulatory Visit: Payer: Self-pay

## 2020-06-26 DIAGNOSIS — Z170 Estrogen receptor positive status [ER+]: Secondary | ICD-10-CM

## 2020-06-26 DIAGNOSIS — C50411 Malignant neoplasm of upper-outer quadrant of right female breast: Secondary | ICD-10-CM | POA: Diagnosis not present

## 2020-06-26 LAB — CANCER ANTIGEN 27.29: CA 27.29: 31.8 U/mL (ref 0.0–38.6)

## 2020-06-27 LAB — PTH, INTACT AND CALCIUM
Calcium, Total (PTH): 10.5 mg/dL — ABNORMAL HIGH (ref 8.7–10.3)
PTH: 14 pg/mL — ABNORMAL LOW (ref 15–65)

## 2020-07-01 DIAGNOSIS — D860 Sarcoidosis of lung: Secondary | ICD-10-CM | POA: Diagnosis not present

## 2020-07-01 DIAGNOSIS — D869 Sarcoidosis, unspecified: Secondary | ICD-10-CM | POA: Diagnosis not present

## 2020-07-01 NOTE — Progress Notes (Signed)
[facility]  [address], [address] [place], [place] [address] Phone: [phone]  Fax: [phone]   Clinic Day:  07/02/2020  Referring physician: Baxter Hire, MD  Chief Complaint: Jill Bond is a 70 y.o. female with sarcoid andstage IAright breastcancer who is seen for review of work-up and discussion regarding direction of therapy.  HPI: The patient was last seen in the medical oncology clinic on 06/25/2020. At that time, she felt "alright".  She had been off MTX for 8 weeks. She was taking supplemental calcium (1/day).  Hematocrit was 43.9, hemoglobin 14.3, platelets 248,000, WBC 6,200. Creatinine was 1.01. Calcium was 10.7. AST was 43, ALT 45, alkaline phosphatase 141. CA27.29 was 31.8. PTH was 14 (low) and c/w non-hyperparathyroid hypercalcemia. PTH-related peptide and vitamin D 1,25 hydroxy are pending.  The patient saw Dr. Lanney Gins on 07/01/2020. She was prescribed folic acid 1 mg daily and methotrexate 7.5 mg each week. CXR was completed. Follow-up is scheduled for 09/10/2020.  During the interim, she has been "good." She remains on Femara. Her symptoms are stable.   Past Medical History:  Diagnosis Date  . Anxiety   . Arthritis   . Breast cancer ([place]) 01/2019   invasive mammary carcinoma   . Chronic kidney disease 2006   Left Nephrectomy  . Complication of anesthesia    PONV  . Constipation   . Depression   . GERD (gastroesophageal reflux disease)   . Headache    migraines  . Hypertension   . Personal history of radiation therapy   . PONV (postoperative nausea and vomiting)   . Rotator cuff disorder    LEFT  . Sarcoidosis   . Shortness of breath dyspnea    with exertion  . Sleep apnea   . Trigger finger, right     Past Surgical History:  Procedure Laterality Date  . ABDOMINAL HYSTERECTOMY     PARTIAL  . ACHILLES TENDON SURGERY Left 02/22/2015   Procedure: Secondary ACHILLES TENDON REPAIR;  Surgeon: Albertine Patricia, DPM;  Location: [facility] ORS;  Service: Podiatry;  Laterality: Left;  . Bladder tac    . BREAST BIOPSY Right 01/23/2019   affirm stereo bx of calcs with x marker, ductal hyperplasia  . BREAST BIOPSY Right 01/23/2019   [place] bx of mass with coil marker, invasive mammary carcinoma/DCIS  . BREAST EXCISIONAL BIOPSY Left 2003   neg  . BREAST LUMPECTOMY Right 02/06/2019   IMC/DCIS clear margins, ONE LYMPH NODE WITH 1.2 MM FOCUS OF MICRO METASTATIC CARCINOMA  . COLONOSCOPY  2013  . EXCISION / BIOPSY BREAST / NIPPLE / DUCT Right 01/19/2020   duct excision due to nipple d/c, Usual ductal hyperplasia, patchy stromal fibrosis. No atypia or malignancy  . EXCISION OF BREAST BIOPSY Right 01/19/2020   Procedure: EXCISION OF BREAST BIOPSY;  Surgeon: Herbert Pun, MD;  Location: [facility] ORS;  Service: General;  Laterality: Right;  . JOINT REPLACEMENT Left 2007   Total Knee Replacement  . KIDNEY CYST REMOVAL Left   . NEPHRECTOMY Left   . OSTECTOMY Left 02/22/2015   Procedure: OSTECTOMY;  Surgeon: Albertine Patricia, DPM;  Location: [facility] ORS;  Service: Podiatry;  Laterality: Left;  . PARTIAL MASTECTOMY WITH NEEDLE LOCALIZATION AND AXILLARY SENTINEL LYMPH NODE BX Right 02/06/2019   Procedure: PARTIAL MASTECTOMY WITH NEEDLE LOCALIZATION AND AXILLARY SENTINEL LYMPH NODE BX;  Surgeon: Herbert Pun, MD;  Location: [facility] ORS;  Service: General;  Laterality: Right;  . SHOULDER ARTHROSCOPY W/ ROTATOR CUFF REPAIR Left YRS AGO  .  TONSILLECTOMY  AGE 52  . TOTAL KNEE ARTHROPLASTY Right 10/22/2015   Procedure: RIGHT TOTAL KNEE ARTHROPLASTY;  Surgeon: Paralee Cancel, MD;  Location: [facility];  Service: Orthopedics;  Laterality: Right;  . TOTAL KNEE ARTHROPLASTY Right    10/22/2015    Family History  Problem Relation Age of Onset  . Breast cancer Mother 75  . Heart failure Father   . Prostate cancer Father     Social History:  reports that she has never smoked. She has never used smokeless tobacco. She  reports that she does not drink alcohol and does not use drugs. She denies any exposure to radiation or toxins. 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 Her husband died on 2013-09-12.Her first cousinis [name]([phone]).Her daughter, Pat,is her medical power of attorney. The patient is alone today.   Allergies:  Allergies  Allergen Reactions  . Codeine Nausea And Vomiting  . Penicillins Hives and Other (See Comments)    Has patient had a PCN reaction causing immediate rash, facial/tongue/throat swelling, SOB or lightheadedness with hypotension: no Has patient had a PCN reaction causing severe rash involving mucus membranes or skin necrosis: no Has patient had a PCN reaction that required hospitalization no Has patient had a PCN reaction occurring within the last 10 years: no If all of the above answers are "NO", then may proceed with Cephalosporin use.     Current Medications: Current Outpatient Medications  Medication Sig Dispense Refill  . amLODipine (NORVASC) 10 MG tablet Take 10 mg by mouth at bedtime.    . cyclobenzaprine (FLEXERIL) 10 MG tablet Take 1 tablet by mouth 2 (two) times daily.     . diclofenac sodium (VOLTAREN) 1 % GEL Apply 1 application topically 2 (two) times daily as needed (foot pain).    Marland Kitchen docusate sodium (COLACE) 100 MG capsule Take 300 mg by mouth at bedtime.    . folic acid (FOLVITE) 1 MG tablet Take 1 mg by mouth daily.    Marland Kitchen letrozole (FEMARA) 2.5 MG tablet TAKE 1 TABLET BY MOUTH EVERY DAY (Patient taking differently: Take 2.5 mg by mouth daily.) 90 tablet 1  . methotrexate (RHEUMATREX) 2.5 MG tablet TAKE 4 TABLETS (10 MG TOTAL) BY MOUTH ONCE A WEEK. CAUTION:CHEMOTHERAPY. PROTECT FROM LIGHT. (Patient taking differently: Take 10 mg by mouth every Sunday. Caution:Chemotherapy. Protect from light.) 52 tablet 2  . metoprolol (TOPROL-XL) 200 MG 24 hr tablet Take 100 mg by mouth daily.    . Multiple Vitamin (MULTIVITAMIN WITH MINERALS) TABS  tablet Take 1 tablet by mouth daily.     . Omega-3 Fatty Acids (OMEGA 3 500) 500 MG CAPS Take 500 mg by mouth daily.    Marland Kitchen omeprazole (PRILOSEC) 20 MG capsule Take 20 mg by mouth daily.     . ondansetron (ZOFRAN) 4 MG tablet Take 1 tablet (4 mg total) by mouth every 8 (eight) hours as needed for nausea or vomiting. 10 tablet 0  . silver sulfADIAZINE (SILVADENE) 1 % cream Apply 1 application topically daily as needed (skin irritation).    . vitamin E 180 MG (400 UNITS) capsule Take 800 Units by mouth daily.    Marland Kitchen aspirin-acetaminophen-caffeine (MIGRAINE RELIEF) 250-250-65 MG tablet Take 1 tablet by mouth every 6 (six) hours as needed for headache. (Patient not taking: Reported on 07/02/2020)    . nystatin (MYCOSTATIN/NYSTOP) powder Apply 1 application topically 3 (three) times daily. (Patient not taking: Reported on 07/02/2020) 15 g 0   No current facility-administered medications  for this visit.    Review of Systems  Constitutional: Negative.  Negative for chills, diaphoresis, fever, malaise/fatigue and weight loss (stable).       Feels "good."  HENT: Negative for congestion, ear discharge, ear pain, hearing loss, nosebleeds, sinus pain, sore throat and tinnitus.        Dry mouth.  Eyes: Negative.  Negative for blurred vision.  Respiratory: Negative for cough, hemoptysis, sputum production and shortness of breath.        Sleep apnea. Uses CPAP sometimes.  Cardiovascular: Negative.  Negative for chest pain, palpitations and leg swelling.  Gastrointestinal: Negative.  Negative for abdominal pain, blood in stool, constipation, diarrhea, heartburn, melena, nausea and vomiting.  Genitourinary: Negative.  Negative for dysuria, frequency, hematuria and urgency.  Musculoskeletal: Positive for joint pain (knees, trigger finger). Negative for back pain, myalgias and neck pain.  Skin: Negative.  Negative for itching and rash.  Neurological: Negative.  Negative for dizziness, tingling, sensory change,  weakness and headaches.  Endo/Heme/Allergies: Negative.  Does not bruise/bleed easily.  Psychiatric/Behavioral: Positive for memory loss. Negative for depression. The patient has insomnia. The patient is not nervous/anxious.   All other systems reviewed and are negative.   Performance status (ECOG): 1  Vitals Blood pressure (!) 149/69, pulse 80, temperature 97.8 F (36.6 C), temperature source Tympanic, resp. rate 18, weight 213 lb 13.5 oz (97 kg), SpO2 99 %.   Physical Exam Vitals and nursing note reviewed.  Constitutional:      General: She is not in acute distress.    Appearance: She is not diaphoretic.  Eyes:     General: No scleral icterus.    Conjunctiva/sclera: Conjunctivae normal.  Neurological:     Mental Status: She is alert and oriented to person, place, and time.  Psychiatric:        Behavior: Behavior normal.        Thought Content: Thought content normal.        Judgment: Judgment normal.    No visits with results within 3 Day(s) from this visit.  Latest known visit with results is:  Appointment on 06/26/2020  Component Date Value Ref Range Status  . PTH 06/26/2020 14* 15 - 65 pg/mL Final  . Calcium, Total (PTH) 06/26/2020 10.5* 8.7 - 10.3 mg/dL Final  . PTH Interp 06/26/2020 Comment   Final   Comment: (NOTE) Interpretation                 Intact PTH    Calcium                                (pg/mL)      (mg/dL) Normal                          15 - 65     8.6 - 10.2 Primary Hyperparathyroidism         >65          >10.2 Secondary Hyperparathyroidism       >65          <10.2 Non-Parathyroid Hypercalcemia       <65          >10.2 Hypoparathyroidism                  <15          < 8.6 Non-Parathyroid Hypocalcemia    15 - 65          <  8.6 Performed At: [facility] [address] [place], [place] [id number] Rush Farmer MD YM:[id number]     Assessment:  Jill Bond is a 70 y.o. female withstage IAright breast cancers/plumpectomy on  02/06/2019. Pathology([id number]) revealed a1.2 cm grade I invasive carcinoma of no special type. Low grade DCIS was present. Closest margin was 4 mm. One of threesentinellymph nodes were positive for micro metastatic carcinoma(>0.2 to 2 mm). Margins are negative for atypia and malignancy.Tumor was ER positive (>90%) PR positive (51-90%) and HER-2/neu negative (1+).Pathologic stagewas pT1c pN64m.  Oncotype DX testingon 03/09/2019 revealed a recurrence score of 16 with a distant recurrence risk at 9 years of 15% (95% CI 10-19%) and no apparent benefit of chemotherapy.   Bilateralscreening mammogramon 12/16/2018 revealed a mass in the right breast.There were no suspicious findings in the left breast.Right diagnosticmammogramand ultrasoundon 01/17/2019 revealed a1 x 1.2 x 1.1 cm hypoechoicmass in the right breast at the 9:30 position, 7 cm from the nipple. There were indeterminate calcifications in the upper-outerquadrant of theright breast. Lymph nodes were unremarkable.  Shereceived right breastradiationfrom11/01/2019-05/15/2019.  She began Femara on 06/29/2019.  Right diagnostic mammogram and ultrasound on 10/06/2019 revealed no suspicious abnormality was identified within the right breast.  There was indeterminate spontaneous clear and sometimes yellowish discharge from the right nipple. While this may be secondary to the seroma within the upper-outer right breast, no discrete connection from the seroma to the nipple was identified with ultrasound.   Bilateral breast MRI on 12/05/2019 revealed a 1 cm focal enhancement at the base of the right nipple.  This was not amenable to image-guided biopsy due to its far anterior, retroareolar location. A correlate was not identified sonographically on prior diagnostic workup. Recommendation was for surgical evaluation for possible excisional biopsy.  There were expected postsurgical and postradiation changes of the  right breast.  There was no MRI evidence of malignancy on the left.  Right breast excisional biopsy on 01/19/2020 revealed focal duct ectasia with intraluminal inflammatory debris, usual ductal hyperplasia, and patchy stromal fibrosis. There was no atypia or malignancy.  CA27.29 has been followed: 27.8 on 01/31/2019, 25.8 on 06/13/2019, 20.4 on 10/31/2019, and 55.2 on 02/21/2020.  She hassarcoidosiss/p utrasound guided supraclavicular lymph node biopsy on 03/11/2016. Pathologyrevealed non-necrotizing granulomatousinflammation with fibrosis. There was no evidence of malignancy. GMS-fungal, AFB and Gram stainswerenegative.She presented withsmall upper abdominal adenopathy,bibasilar lung nodes, a30 pound weight loss,some drenching night sweats, and mild hypercalcemia.   Abdominal and pelvic CTscan on 02/26/2016 revealed multiple new bibasilar lung nodules measuring up to 5 mm in size. There was new para-aortic and gastrohepatic lymph nodes measuring up to 1 cm in size, suspicious for metastatic disease. Abdomen and pelvis CT on 06/28/2019 revealed no acute abdominal/pelvic findings, mass lesions or adenopathy.  There was cholelithiasis and s/p nephrectomy with no findings for recurrent tumor or metastatic disease.  There was a small bulging midline pelvic hernia containing part of the dome of the bladder. There were stable small basilar pulmonary nodules.  She has a history ofmultiple angiomyolipomass/p left nephrectomy on 03/15/2006. Baseline creatinineis 0.9 - 1.1 (CrCl 50-63 ml/min).  Bone densityon 02/22/2019 was normal with a T-score of -0.5 at the right femur neck.   She has hypercalcemia.  Calcium was 10.7 with an albumin of 3.8 on 06/25/2020.  PTH was 14 (low) consistent with a nonparathyroid hypercalcemia.  PTH-rp was low.  Vitamin D 1, 25 hydroxy was 93 (21-65).  Etiology felt likely secondary to sarcoid.  Patient has elevated LFTs.  Work-up  by GI revealed hepatic  steatosis.  Colonoscopyin 2013 revealed polyps (next scheduled 2018). She had a partial hysterectomy at age 39.  The patient has not received the COVID-19 vaccine.  Symptomatically, she has been "good." She remains on Femara. Her symptoms are stable.  Plan: 1.   Review work-up. 2.Stage IA right breast cancer She is s/plumpectomyand SLN biopsyon 02/06/2019. Pathologic stage was T1cN17m (stage IA) OncoType DX testingrevealed a recurrence score of 16. No benefit of chemotherapy. Patient s/p right breastradiation(completed on 05/15/2019). She began Femara on 06/29/2019.   She is tolerating Femara well.  Nipple discharge prompted breast MRI and biopsy.   Biopsy on 01/19/2020 revealed no evidence of malignancy.  Continue to monitor. 3.Mildly elevated LFTs             AST 43.  ALT   45.  Alkaline phosphatase 141 on 06/25/2020.             Hepatitis B and C serologies were negative.             Abdomen and pelvis CT on 06/28/2019 revealed no liver lesions or recurrent disease.  [facility]  Patient felt to have hepatic steatosis. 4.   Hypercalcemia  Calcium was 10.7 with an albumin of 3.8 on 06/25/2020.  PTH was 14 (low) consistent with a nonparathyroid hypercalcemia.  PTH-rp was low.  Vitamin D 1, 25 hydroxy was 93 (21-65).  Based on above testing, etiology felt likely secondary to sarcoid.  Patient recently started back on treatment (methotrexate). 5.   MD to call patient with hypercalcemia work-up. 6.   MD to call Dr ALanney Ginsre:  chest imaging. 7.   RTC in 4 months for MD assessment, labs (CBC with diff, CMP, CA27.29).  I discussed the assessment and treatment plan with the patient.  The patient was provided an opportunity to ask questions and all were answered.  The patient agreed with the plan and demonstrated an understanding of the instructions.   The patient was advised to call back if the symptoms worsen or if the condition fails to improve as anticipated.   MLequita Asal MD, PhD    07/02/2020, 2:35 PM  I, EMirian MoTufford, am acting as a sEducation administratorfor [employer] CMike Gip MD.  I, Carole Doner C. CMike Gip MD, have reviewed the above documentation for accuracy and completeness, and I agree with the above.

## 2020-07-02 ENCOUNTER — Other Ambulatory Visit: Payer: Self-pay

## 2020-07-02 ENCOUNTER — Inpatient Hospital Stay: Payer: Medicare HMO | Admitting: Hematology and Oncology

## 2020-07-02 VITALS — BP 149/69 | HR 80 | Temp 97.8°F | Resp 18 | Wt 213.8 lb

## 2020-07-02 DIAGNOSIS — D869 Sarcoidosis, unspecified: Secondary | ICD-10-CM

## 2020-07-02 DIAGNOSIS — Z170 Estrogen receptor positive status [ER+]: Secondary | ICD-10-CM | POA: Diagnosis not present

## 2020-07-02 DIAGNOSIS — C50411 Malignant neoplasm of upper-outer quadrant of right female breast: Secondary | ICD-10-CM | POA: Diagnosis not present

## 2020-07-03 LAB — PTH-RELATED PEPTIDE: PTH-related peptide: <2 pmol/L

## 2020-07-05 LAB — VITAMIN D 1,25 DIHYDROXY
Vitamin D 1, 25 (OH)2 Total: 93 pg/mL — ABNORMAL HIGH
Vitamin D2 1, 25 (OH)2: 10 pg/mL
Vitamin D3 1, 25 (OH)2: 83 pg/mL

## 2020-08-13 DIAGNOSIS — D860 Sarcoidosis of lung: Secondary | ICD-10-CM | POA: Diagnosis not present

## 2020-09-10 DIAGNOSIS — N6452 Nipple discharge: Secondary | ICD-10-CM | POA: Diagnosis not present

## 2020-09-10 DIAGNOSIS — Z01818 Encounter for other preprocedural examination: Secondary | ICD-10-CM | POA: Diagnosis not present

## 2020-09-10 DIAGNOSIS — D241 Benign neoplasm of right breast: Secondary | ICD-10-CM | POA: Diagnosis not present

## 2020-09-10 DIAGNOSIS — D869 Sarcoidosis, unspecified: Secondary | ICD-10-CM | POA: Diagnosis not present

## 2020-10-09 ENCOUNTER — Other Ambulatory Visit: Payer: Self-pay | Admitting: *Deleted

## 2020-10-09 DIAGNOSIS — C50411 Malignant neoplasm of upper-outer quadrant of right female breast: Secondary | ICD-10-CM

## 2020-10-09 MED ORDER — LETROZOLE 2.5 MG PO TABS: 2.5000 mg | ORAL_TABLET | Freq: Every day | ORAL | 0 refills | Status: DC

## 2020-10-29 ENCOUNTER — Other Ambulatory Visit: Payer: Self-pay

## 2020-10-29 DIAGNOSIS — Z170 Estrogen receptor positive status [ER+]: Secondary | ICD-10-CM

## 2020-10-29 DIAGNOSIS — C50411 Malignant neoplasm of upper-outer quadrant of right female breast: Secondary | ICD-10-CM

## 2020-10-30 ENCOUNTER — Inpatient Hospital Stay: Payer: Medicare HMO | Attending: Oncology

## 2020-10-30 ENCOUNTER — Other Ambulatory Visit: Payer: Self-pay

## 2020-10-30 ENCOUNTER — Encounter: Payer: Self-pay | Admitting: Oncology

## 2020-10-30 ENCOUNTER — Inpatient Hospital Stay: Payer: Medicare HMO | Admitting: Oncology

## 2020-10-30 VITALS — BP 154/74 | HR 72 | Temp 97.9°F | Resp 20 | Wt 218.0 lb

## 2020-10-30 DIAGNOSIS — Z08 Encounter for follow-up examination after completed treatment for malignant neoplasm: Secondary | ICD-10-CM

## 2020-10-30 DIAGNOSIS — C50911 Malignant neoplasm of unspecified site of right female breast: Secondary | ICD-10-CM | POA: Diagnosis not present

## 2020-10-30 DIAGNOSIS — Z923 Personal history of irradiation: Secondary | ICD-10-CM | POA: Diagnosis not present

## 2020-10-30 DIAGNOSIS — Z853 Personal history of malignant neoplasm of breast: Secondary | ICD-10-CM | POA: Diagnosis not present

## 2020-10-30 DIAGNOSIS — Z79811 Long term (current) use of aromatase inhibitors: Secondary | ICD-10-CM | POA: Diagnosis not present

## 2020-10-30 DIAGNOSIS — Z170 Estrogen receptor positive status [ER+]: Secondary | ICD-10-CM | POA: Diagnosis not present

## 2020-10-30 LAB — CBC WITH DIFFERENTIAL/PLATELET
Abs Immature Granulocytes: 0.02 10*3/uL (ref 0.00–0.07)
Basophils Absolute: 0 10*3/uL (ref 0.0–0.1)
Basophils Relative: 1 %
Eosinophils Absolute: 0.4 10*3/uL (ref 0.0–0.5)
Eosinophils Relative: 6 %
HCT: 39.9 % (ref 36.0–46.0)
Hemoglobin: 13.5 g/dL (ref 12.0–15.0)
Immature Granulocytes: 0 %
Lymphocytes Relative: 15 %
Lymphs Abs: 0.9 10*3/uL (ref 0.7–4.0)
MCH: 31.6 pg (ref 26.0–34.0)
MCHC: 33.8 g/dL (ref 30.0–36.0)
MCV: 93.4 fL (ref 80.0–100.0)
Monocytes Absolute: 0.8 10*3/uL (ref 0.1–1.0)
Monocytes Relative: 15 %
Neutro Abs: 3.6 10*3/uL (ref 1.7–7.7)
Neutrophils Relative %: 63 %
Platelets: 169 10*3/uL (ref 150–400)
RBC: 4.27 MIL/uL (ref 3.87–5.11)
RDW: 14.9 % (ref 11.5–15.5)
WBC: 5.7 10*3/uL (ref 4.0–10.5)
nRBC: 0 % (ref 0.0–0.2)

## 2020-10-30 LAB — COMPREHENSIVE METABOLIC PANEL
ALT: 71 U/L — ABNORMAL HIGH (ref 0–44)
AST: 53 U/L — ABNORMAL HIGH (ref 15–41)
Albumin: 3.9 g/dL (ref 3.5–5.0)
Alkaline Phosphatase: 134 U/L — ABNORMAL HIGH (ref 38–126)
Anion gap: 5 (ref 5–15)
BUN: 21 mg/dL (ref 8–23)
CO2: 29 mmol/L (ref 22–32)
Calcium: 10.6 mg/dL — ABNORMAL HIGH (ref 8.9–10.3)
Chloride: 104 mmol/L (ref 98–111)
Creatinine, Ser: 0.97 mg/dL (ref 0.44–1.00)
GFR, Estimated: >60 mL/min (ref >60)
Glucose, Bld: 110 mg/dL — ABNORMAL HIGH (ref 70–99)
Potassium: 4 mmol/L (ref 3.5–5.1)
Sodium: 138 mmol/L (ref 135–145)
Total Bilirubin: 0.7 mg/dL (ref 0.3–1.2)
Total Protein: 7.4 g/dL (ref 6.5–8.1)

## 2020-10-30 NOTE — Progress Notes (Signed)
Pt c/o knee pain today 7/10. Pt states underneath her breast becomes very itchy as well as very sweaty.

## 2020-10-31 ENCOUNTER — Encounter: Payer: Self-pay | Admitting: Hematology and Oncology

## 2020-10-31 LAB — CANCER ANTIGEN 27.29: CA 27.29: 30.8 U/mL (ref 0.0–38.6)

## 2020-10-31 NOTE — Progress Notes (Signed)
Hematology/Oncology Consult note [facility]  Telephone:([phone] [phone]  Patient Care Team: Baxter Hire, MD as PCP - General (Internal Medicine) Laverle Hobby, MD as Consulting Physician (Pulmonary Disease) Lequita Asal, MD as Referring Physician (Hematology and Oncology) Herbert Pun, MD as Consulting Physician (General Surgery) Noreene Filbert, MD as Referring Physician (Radiation Oncology) Theodore Demark, RN as Oncology Nurse Navigator Arial, Benay Pike, MD as Consulting Physician (Gastroenterology) Rodney Booze as Physician Assistant Ottie Glazier, MD as Consulting Physician (Pulmonary Disease)   Name of the patient: Jill Bond  [id number]  09-06-1950   Date of visit: 10/31/20  Diagnosis-history of stage Ia right breast cancer ER/PR positive HER2 negative  Chief complaint/ Reason for visit-routine follow-up of breast cancer  Heme/Onc history: Patient is a 70 year old female diagnosed with stage Ia right breast cancer in September 2020.  1.2 cm grade 1 invasive mammary carcinoma with low-grade DCIS.  1 out of 3 sentinel lymph nodes positive for micrometastatic carcinoma.  Tumor greater than 90% ER positive, PR 51 to 90% positive and HER2 negative.  Oncotype score was 16 and she did not require any adjuvant chemotherapy.  Patient also has a history of sarcoidosis biopsy-proven on supraclavicular lymph node biopsy.  Mild hypercalcemia secondary to that.  History of multiple angiomyolipoma s/p left nephrectomy in 2007.  Baseline bone density scan normal.  She began Femara in February 2021.  Also in July 2021 there was some enhancement noted at the base of the right nipple.  She had an excisional biopsy which showed focal duct ectasia and usual ductal hyperplasia but no evidence of malignancy  Interval history-patient is tolerating Femara well without any significant side effects.  Appetite and weight  have remained stable.  Has occasional pain at the lumpectomy site.  Denies other new aches and pains anywhere.  ECOG PS- 1 Pain scale- 0   Review of systems- Review of Systems  Constitutional:  Negative for chills, fever, malaise/fatigue and weight loss.  HENT:  Negative for congestion, ear discharge and nosebleeds.   Eyes:  Negative for blurred vision.  Respiratory:  Negative for cough, hemoptysis, sputum production, shortness of breath and wheezing.   Cardiovascular:  Negative for chest pain, palpitations, orthopnea and claudication.  Gastrointestinal:  Negative for abdominal pain, blood in stool, constipation, diarrhea, heartburn, melena, nausea and vomiting.  Genitourinary:  Negative for dysuria, flank pain, frequency, hematuria and urgency.  Musculoskeletal:  Negative for back pain, joint pain and myalgias.  Skin:  Negative for rash.  Neurological:  Negative for dizziness, tingling, focal weakness, seizures, weakness and headaches.  Endo/Heme/Allergies:  Does not bruise/bleed easily.  Psychiatric/Behavioral:  Negative for depression and suicidal ideas. The patient does not have insomnia.      Allergies  Allergen Reactions   Codeine Nausea And Vomiting   Penicillins Hives and Other (See Comments)    Has patient had a PCN reaction causing immediate rash, facial/tongue/throat swelling, SOB or lightheadedness with hypotension: no Has patient had a PCN reaction causing severe rash involving mucus membranes or skin necrosis: no Has patient had a PCN reaction that required hospitalization no Has patient had a PCN reaction occurring within the last 10 years: no If all of the above answers are "NO", then may proceed with Cephalosporin use.      Past Medical History:  Diagnosis Date   Anxiety    Arthritis    Breast cancer ([place]) 01/2019   invasive mammary carcinoma    Chronic  kidney disease 2006   Left Nephrectomy   Complication of anesthesia    PONV   Constipation     Depression    GERD (gastroesophageal reflux disease)    Headache    migraines   Hypertension    Personal history of radiation therapy    PONV (postoperative nausea and vomiting)    Rotator cuff disorder    LEFT   Sarcoidosis    Shortness of breath dyspnea    with exertion   Sleep apnea    Trigger finger, right      Past Surgical History:  Procedure Laterality Date   ABDOMINAL HYSTERECTOMY     PARTIAL   ACHILLES TENDON SURGERY Left 02/22/2015   Procedure: Secondary ACHILLES TENDON REPAIR;  Surgeon: Matthew Troxler, DPM;  Location: [facility] ORS;  Service: Podiatry;  Laterality: Left;   Bladder tac     BREAST BIOPSY Right 01/23/2019   affirm stereo bx of calcs with x marker, ductal hyperplasia   BREAST BIOPSY Right 01/23/2019   us bx of mass with coil marker, invasive mammary carcinoma/DCIS   BREAST EXCISIONAL BIOPSY Left 2003   neg   BREAST LUMPECTOMY Right 02/06/2019   IMC/DCIS clear margins, ONE LYMPH NODE WITH 1.2 MM FOCUS OF MICRO METASTATIC CARCINOMA   COLONOSCOPY  2013   EXCISION / BIOPSY BREAST / NIPPLE / DUCT Right 01/19/2020   duct excision due to nipple d/c, Usual ductal hyperplasia, patchy stromal fibrosis. No atypia or malignancy   EXCISION OF BREAST BIOPSY Right 01/19/2020   Procedure: EXCISION OF BREAST BIOPSY;  Surgeon: Cintron-Diaz, Edgardo, MD;  Location: [facility] ORS;  Service: General;  Laterality: Right;   JOINT REPLACEMENT Left 2007   Total Knee Replacement   KIDNEY CYST REMOVAL Left    NEPHRECTOMY Left    OSTECTOMY Left 02/22/2015   Procedure: OSTECTOMY;  Surgeon: Matthew Troxler, DPM;  Location: [facility] ORS;  Service: Podiatry;  Laterality: Left;   PARTIAL MASTECTOMY WITH NEEDLE LOCALIZATION AND AXILLARY SENTINEL LYMPH NODE BX Right 02/06/2019   Procedure: PARTIAL MASTECTOMY WITH NEEDLE LOCALIZATION AND AXILLARY SENTINEL LYMPH NODE BX;  Surgeon: Cintron-Diaz, Edgardo, MD;  Location: [facility] ORS;  Service: General;  Laterality: Right;   SHOULDER ARTHROSCOPY W/ ROTATOR  CUFF REPAIR Left YRS AGO   TONSILLECTOMY  AGE 19   TOTAL KNEE ARTHROPLASTY Right 10/22/2015   Procedure: RIGHT TOTAL KNEE ARTHROPLASTY;  Surgeon: Matthew Olin, MD;  Location: [facility];  Service: Orthopedics;  Laterality: Right;   TOTAL KNEE ARTHROPLASTY Right    10/22/2015    Social History   Socioeconomic History   Marital status: Widowed    Spouse name: Not on file   Number of children: Not on file   Years of education: Not on file   Highest education level: Not on file  Occupational History   Not on file  Tobacco Use   Smoking status: Never   Smokeless tobacco: Never  Vaping Use   Vaping Use: Never used  Substance and Sexual Activity   Alcohol use: No   Drug use: No   Sexual activity: Not on file  Other Topics Concern   Not on file  Social History Narrative   Not on file   Social Determinants of Health   Financial Resource Strain: Not on file  Food Insecurity: Not on file  Transportation Needs: Not on file  Physical Activity: Not on file  Stress: Not on file  Social Connections: Not on file  Intimate Partner Violence: Not on file    Family   History  Problem Relation Age of Onset   Breast cancer Mother 66   Heart failure Father    Prostate cancer Father      Current Outpatient Medications:    albuterol (VENTOLIN HFA) 108 (90 Base) MCG/ACT inhaler, Inhale into the lungs., Disp: , Rfl:    amLODipine (NORVASC) 10 MG tablet, Take 10 mg by mouth at bedtime., Disp: , Rfl:    amLODipine (NORVASC) 10 MG tablet, Take 1 tablet by mouth daily., Disp: , Rfl:    cyclobenzaprine (FLEXERIL) 10 MG tablet, Take 1 tablet by mouth 2 (two) times daily. , Disp: , Rfl:    diclofenac sodium (VOLTAREN) 1 % GEL, Apply 1 application topically 2 (two) times daily as needed (foot pain)., Disp: , Rfl:    docusate sodium (COLACE) 100 MG capsule, Take 300 mg by mouth at bedtime., Disp: , Rfl:    folic acid (FOLVITE) 1 MG tablet, Take 1 mg by mouth daily., Disp: , Rfl:    letrozole (FEMARA)  2.5 MG tablet, Take 1 tablet (2.5 mg total) by mouth daily., Disp: 90 tablet, Rfl: 0   levofloxacin (LEVAQUIN) 250 MG tablet, , Disp: , Rfl:    methotrexate (RHEUMATREX) 2.5 MG tablet, TAKE 4 TABLETS (10 MG TOTAL) BY MOUTH ONCE A WEEK. CAUTION:CHEMOTHERAPY. PROTECT FROM LIGHT. (Patient taking differently: Take 10 mg by mouth every Sunday. Caution:Chemotherapy. Protect from light.), Disp: 52 tablet, Rfl: 2   metoprolol (TOPROL-XL) 200 MG 24 hr tablet, Take 100 mg by mouth daily., Disp: , Rfl:    Multiple Vitamin (MULTIVITAMIN WITH MINERALS) TABS tablet, Take 1 tablet by mouth daily. , Disp: , Rfl:    nystatin (MYCOSTATIN/NYSTOP) powder, Apply 1 application topically 3 (three) times daily., Disp: 15 g, Rfl: 0   Omega-3 Fatty Acids (OMEGA 3 500) 500 MG CAPS, Take 500 mg by mouth daily., Disp: , Rfl:    ondansetron (ZOFRAN) 4 MG tablet, Take 1 tablet (4 mg total) by mouth every 8 (eight) hours as needed for nausea or vomiting., Disp: 10 tablet, Rfl: 0   silver sulfADIAZINE (SILVADENE) 1 % cream, Apply 1 application topically daily as needed (skin irritation)., Disp: , Rfl:    vitamin E 180 MG (400 UNITS) capsule, Take 800 Units by mouth daily., Disp: , Rfl:    aspirin-acetaminophen-caffeine (MIGRAINE RELIEF) 250-250-65 MG tablet, Take 1 tablet by mouth every 6 (six) hours as needed for headache. (Patient not taking: No sig reported), Disp: , Rfl:    omeprazole (PRILOSEC) 20 MG capsule, Take 20 mg by mouth daily. , Disp: , Rfl:   Physical exam:  Vitals:   10/30/20 1316  BP: (!) 154/74  Pulse: 72  Resp: 20  Temp: 97.9 F (36.6 C)  SpO2: 100%  Weight: 218 lb 0.6 oz (98.9 kg)   Physical Exam Cardiovascular:     Rate and Rhythm: Normal rate and regular rhythm.     Heart sounds: Normal heart sounds.  Pulmonary:     Effort: Pulmonary effort is normal.     Breath sounds: Normal breath sounds.  Abdominal:     General: Bowel sounds are normal.     Palpations: Abdomen is soft.  Skin:    General:  Skin is warm and dry.  Neurological:     Mental Status: She is alert and oriented to person, place, and time.    Breast exam was performed in seated and lying down position. Patient is status post right lumpectomy with a well-healed surgical scar. No evidence of any palpable   masses. No evidence of axillary adenopathy. No evidence of any palpable masses or lumps in the left breast. No evidence of leftt axillary adenopathy  CMP Latest Ref Rng & Units 10/30/2020  Glucose 70 - 99 mg/dL 110(H)  BUN 8 - 23 mg/dL 21  Creatinine 0.44 - 1.00 mg/dL 0.97  Sodium 135 - 145 mmol/L 138  Potassium 3.5 - 5.1 mmol/L 4.0  Chloride 98 - 111 mmol/L 104  CO2 22 - 32 mmol/L 29  Calcium 8.9 - 10.3 mg/dL 10.6(H)  Total Protein 6.5 - 8.1 g/dL 7.4  Total Bilirubin 0.3 - 1.2 mg/dL 0.7  Alkaline Phos 38 - 126 U/L 134(H)  AST 15 - 41 U/L 53(H)  ALT 0 - 44 U/L 71(H)   CBC Latest Ref Rng & Units 10/30/2020  WBC 4.0 - 10.5 K/uL 5.7  Hemoglobin 12.0 - 15.0 g/dL 13.5  Hematocrit 36.0 - 46.0 % 39.9  Platelets 150 - 400 K/uL 169     Assessment and plan- Patient is a 70 y.o. female with history of stage Ia right breast cancer ER/PR positive HER2 negative in September 2020 s/p surgery and adjuvant radiation treatment and currently on Femara here for routine follow-up  Clinically patient is doing well with no concerning signs and symptoms of recurrence on today's exam.  She will need a diagnostic mammogram in December 2022 which I will schedule.  I will see her back in 6 months with labs and she will need a bone density scan prior   Visit Diagnosis 1. Encounter for follow-up surveillance of breast cancer      Dr. Randa Evens, MD, MPH [facility] [phone] 10/31/2020 9:16 AM

## 2020-11-14 DIAGNOSIS — U071 COVID-19: Secondary | ICD-10-CM | POA: Diagnosis not present

## 2020-11-14 DIAGNOSIS — R6889 Other general symptoms and signs: Secondary | ICD-10-CM | POA: Diagnosis not present

## 2020-12-10 DIAGNOSIS — N1832 Chronic kidney disease, stage 3b: Secondary | ICD-10-CM | POA: Diagnosis not present

## 2020-12-10 DIAGNOSIS — R739 Hyperglycemia, unspecified: Secondary | ICD-10-CM | POA: Diagnosis not present

## 2020-12-17 DIAGNOSIS — I129 Hypertensive chronic kidney disease with stage 1 through stage 4 chronic kidney disease, or unspecified chronic kidney disease: Secondary | ICD-10-CM | POA: Diagnosis not present

## 2020-12-17 DIAGNOSIS — D869 Sarcoidosis, unspecified: Secondary | ICD-10-CM | POA: Diagnosis not present

## 2020-12-17 DIAGNOSIS — C50411 Malignant neoplasm of upper-outer quadrant of right female breast: Secondary | ICD-10-CM | POA: Diagnosis not present

## 2020-12-17 DIAGNOSIS — N1832 Chronic kidney disease, stage 3b: Secondary | ICD-10-CM | POA: Diagnosis not present

## 2020-12-17 DIAGNOSIS — Z170 Estrogen receptor positive status [ER+]: Secondary | ICD-10-CM | POA: Diagnosis not present

## 2020-12-17 DIAGNOSIS — U071 COVID-19: Secondary | ICD-10-CM | POA: Diagnosis not present

## 2020-12-17 DIAGNOSIS — E7800 Pure hypercholesterolemia, unspecified: Secondary | ICD-10-CM | POA: Diagnosis not present

## 2020-12-22 ENCOUNTER — Other Ambulatory Visit: Payer: Self-pay | Admitting: Oncology

## 2020-12-22 DIAGNOSIS — C50411 Malignant neoplasm of upper-outer quadrant of right female breast: Secondary | ICD-10-CM

## 2020-12-22 DIAGNOSIS — Z170 Estrogen receptor positive status [ER+]: Secondary | ICD-10-CM

## 2021-01-16 ENCOUNTER — Encounter: Payer: Self-pay | Admitting: *Deleted

## 2021-03-11 DIAGNOSIS — Z01818 Encounter for other preprocedural examination: Secondary | ICD-10-CM | POA: Diagnosis not present

## 2021-03-11 DIAGNOSIS — D869 Sarcoidosis, unspecified: Secondary | ICD-10-CM | POA: Diagnosis not present

## 2021-04-10 DIAGNOSIS — G43109 Migraine with aura, not intractable, without status migrainosus: Secondary | ICD-10-CM | POA: Diagnosis not present

## 2021-04-10 DIAGNOSIS — Z0100 Encounter for examination of eyes and vision without abnormal findings: Secondary | ICD-10-CM | POA: Diagnosis not present

## 2021-04-10 DIAGNOSIS — H40003 Preglaucoma, unspecified, bilateral: Secondary | ICD-10-CM | POA: Diagnosis not present

## 2021-04-14 ENCOUNTER — Inpatient Hospital Stay: Payer: Medicare HMO | Attending: Oncology | Admitting: Oncology

## 2021-04-14 ENCOUNTER — Encounter: Payer: Self-pay | Admitting: Oncology

## 2021-04-15 ENCOUNTER — Ambulatory Visit
Admission: RE | Admit: 2021-04-15 | Discharge: 2021-04-15 | Disposition: A | Discharge status: Home / Self Care | Payer: Medicare HMO | Source: Ambulatory Visit | Attending: Internal Medicine | Admitting: Internal Medicine

## 2021-04-15 ENCOUNTER — Other Ambulatory Visit: Payer: Self-pay | Admitting: Internal Medicine

## 2021-04-15 ENCOUNTER — Other Ambulatory Visit: Payer: Self-pay

## 2021-04-15 DIAGNOSIS — S0990XA Unspecified injury of head, initial encounter: Secondary | ICD-10-CM | POA: Diagnosis not present

## 2021-04-15 DIAGNOSIS — M50323 Other cervical disc degeneration at C6-C7 level: Secondary | ICD-10-CM | POA: Diagnosis not present

## 2021-04-15 DIAGNOSIS — M2578 Osteophyte, vertebrae: Secondary | ICD-10-CM | POA: Diagnosis not present

## 2021-04-15 DIAGNOSIS — J321 Chronic frontal sinusitis: Secondary | ICD-10-CM | POA: Diagnosis not present

## 2021-04-15 DIAGNOSIS — J320 Chronic maxillary sinusitis: Secondary | ICD-10-CM | POA: Diagnosis not present

## 2021-04-15 DIAGNOSIS — M542 Cervicalgia: Secondary | ICD-10-CM | POA: Diagnosis not present

## 2021-04-17 ENCOUNTER — Ambulatory Visit
Admission: RE | Admit: 2021-04-17 | Discharge: 2021-04-17 | Disposition: A | Discharge status: Home / Self Care | Payer: Medicare HMO | Source: Ambulatory Visit | Attending: Oncology | Admitting: Oncology

## 2021-04-17 ENCOUNTER — Other Ambulatory Visit: Payer: Self-pay

## 2021-04-17 DIAGNOSIS — Z853 Personal history of malignant neoplasm of breast: Secondary | ICD-10-CM | POA: Insufficient documentation

## 2021-04-17 DIAGNOSIS — Z08 Encounter for follow-up examination after completed treatment for malignant neoplasm: Secondary | ICD-10-CM | POA: Insufficient documentation

## 2021-04-17 DIAGNOSIS — R922 Inconclusive mammogram: Secondary | ICD-10-CM | POA: Diagnosis not present

## 2021-04-17 DIAGNOSIS — Z780 Asymptomatic menopausal state: Secondary | ICD-10-CM | POA: Insufficient documentation

## 2021-04-17 DIAGNOSIS — Z13820 Encounter for screening for osteoporosis: Secondary | ICD-10-CM | POA: Insufficient documentation

## 2021-04-23 ENCOUNTER — Ambulatory Visit: Payer: Medicare HMO | Admitting: Oncology

## 2021-04-24 DIAGNOSIS — E119 Type 2 diabetes mellitus without complications: Secondary | ICD-10-CM | POA: Diagnosis not present

## 2021-04-24 DIAGNOSIS — Z853 Personal history of malignant neoplasm of breast: Secondary | ICD-10-CM | POA: Diagnosis not present

## 2021-05-19 ENCOUNTER — Other Ambulatory Visit: Payer: Self-pay | Admitting: Oncology

## 2021-05-19 DIAGNOSIS — Z170 Estrogen receptor positive status [ER+]: Secondary | ICD-10-CM

## 2021-05-19 DIAGNOSIS — C50411 Malignant neoplasm of upper-outer quadrant of right female breast: Secondary | ICD-10-CM

## 2021-05-29 ENCOUNTER — Ambulatory Visit: Payer: Medicare HMO | Admitting: Radiation Oncology

## 2021-06-17 DIAGNOSIS — N1832 Chronic kidney disease, stage 3b: Secondary | ICD-10-CM | POA: Diagnosis not present

## 2021-06-17 DIAGNOSIS — R7309 Other abnormal glucose: Secondary | ICD-10-CM | POA: Diagnosis not present

## 2021-06-17 DIAGNOSIS — I10 Essential (primary) hypertension: Secondary | ICD-10-CM | POA: Diagnosis not present

## 2021-06-17 DIAGNOSIS — E7800 Pure hypercholesterolemia, unspecified: Secondary | ICD-10-CM | POA: Diagnosis not present

## 2021-06-19 ENCOUNTER — Encounter: Payer: Self-pay | Admitting: Radiation Oncology

## 2021-06-19 ENCOUNTER — Ambulatory Visit
Admission: RE | Admit: 2021-06-19 | Discharge: 2021-06-19 | Disposition: A | Discharge status: Home / Self Care | Payer: Medicare HMO | Source: Ambulatory Visit | Attending: Radiation Oncology | Admitting: Radiation Oncology

## 2021-06-19 ENCOUNTER — Other Ambulatory Visit: Payer: Self-pay

## 2021-06-19 VITALS — BP 122/84 | Resp 18 | Ht 63.0 in | Wt 221.3 lb

## 2021-06-19 DIAGNOSIS — Z08 Encounter for follow-up examination after completed treatment for malignant neoplasm: Secondary | ICD-10-CM | POA: Diagnosis not present

## 2021-06-19 DIAGNOSIS — Z923 Personal history of irradiation: Secondary | ICD-10-CM | POA: Diagnosis not present

## 2021-06-19 DIAGNOSIS — Z79811 Long term (current) use of aromatase inhibitors: Secondary | ICD-10-CM | POA: Insufficient documentation

## 2021-06-19 DIAGNOSIS — Z170 Estrogen receptor positive status [ER+]: Secondary | ICD-10-CM | POA: Insufficient documentation

## 2021-06-19 DIAGNOSIS — C50411 Malignant neoplasm of upper-outer quadrant of right female breast: Secondary | ICD-10-CM | POA: Insufficient documentation

## 2021-06-19 NOTE — Progress Notes (Signed)
Radiation Oncology Follow up Note  Name: Jill Bond   Date:   06/19/2021 MRN:  [id number] DOB: 11-May-1951    This 71 y.o. female presents to the clinic today for 2-year follow-up status post whole breast radiation to her right breast and peripheral lymphatics for stage T1CN1 invasive ER/PR positive mammary carcinoma.  REFERRING PROVIDER: Baxter Hire, MD  HPI: Patient is a 71 year old female now out 2 years having completed whole breast and peripheral lymphatic radiation therapy for stage T1CN1 invasive mammary carcinoma ER/PR positive.  Seen today in routine follow-up she is doing well specifically denies breast tenderness cough or bone pain..  She had a mammogram back in December which I reviewed was BI-RADS 2 benign.  She is current on Femara tolerating well without side effect.  COMPLICATIONS OF TREATMENT: none  FOLLOW UP COMPLIANCE: keeps appointments   PHYSICAL EXAM:  BP 122/84    Resp 18    Ht 5\' 3"  (1.6 m)    Wt 221 lb 4.8 oz (100.4 kg)    BMI 39.20 kg/m  Obese female in NAD.  Lungs are clear to A&P cardiac examination essentially unremarkable with regular rate and rhythm. No dominant mass or nodularity is noted in either breast in 2 positions examined. Incision is well-healed. No axillary or supraclavicular adenopathy is appreciated. Cosmetic result is excellent.  Well-developed well-nourished patient in NAD. HEENT reveals PERLA, EOMI, discs not visualized.  Oral cavity is clear. No oral mucosal lesions are identified. Neck is clear without evidence of cervical or supraclavicular adenopathy. Lungs are clear to A&P. Cardiac examination is essentially unremarkable with regular rate and rhythm without murmur rub or thrill. Abdomen is benign with no organomegaly or masses noted. Motor sensory and DTR levels are equal and symmetric in the upper and lower extremities. Cranial nerves II through XII are grossly intact. Proprioception is intact. No peripheral adenopathy or edema is  identified. No motor or sensory levels are noted. Crude visual fields are within normal range.  RADIOLOGY RESULTS: Mammograms reviewed compatible with above-stated findings  PLAN: Present time patient continues to do well 2 years out with no evidence of disease.  She continues on Femara without side effect.  I am pleased with her overall progress.  We will see her back in 1 year for follow-up.  Patient knows to call with any concerns.  She continues close follow-up care with oncology.  I would like to take this opportunity to thank you for allowing me to participate in the care of your patient.Noreene Filbert, MD

## 2021-06-24 DIAGNOSIS — E119 Type 2 diabetes mellitus without complications: Secondary | ICD-10-CM | POA: Diagnosis not present

## 2021-06-24 DIAGNOSIS — I129 Hypertensive chronic kidney disease with stage 1 through stage 4 chronic kidney disease, or unspecified chronic kidney disease: Secondary | ICD-10-CM | POA: Diagnosis not present

## 2021-06-24 DIAGNOSIS — Z1231 Encounter for screening mammogram for malignant neoplasm of breast: Secondary | ICD-10-CM | POA: Diagnosis not present

## 2021-06-24 DIAGNOSIS — Z0001 Encounter for general adult medical examination with abnormal findings: Secondary | ICD-10-CM | POA: Diagnosis not present

## 2021-06-24 DIAGNOSIS — E7800 Pure hypercholesterolemia, unspecified: Secondary | ICD-10-CM | POA: Diagnosis not present

## 2021-06-24 DIAGNOSIS — N1832 Chronic kidney disease, stage 3b: Secondary | ICD-10-CM | POA: Diagnosis not present

## 2021-06-24 DIAGNOSIS — G4733 Obstructive sleep apnea (adult) (pediatric): Secondary | ICD-10-CM | POA: Diagnosis not present

## 2021-06-24 DIAGNOSIS — D869 Sarcoidosis, unspecified: Secondary | ICD-10-CM | POA: Diagnosis not present

## 2021-06-24 DIAGNOSIS — Z0000 Encounter for general adult medical examination without abnormal findings: Secondary | ICD-10-CM | POA: Diagnosis not present

## 2021-06-30 ENCOUNTER — Other Ambulatory Visit: Payer: Self-pay | Admitting: Radiation Oncology

## 2021-07-10 DIAGNOSIS — D869 Sarcoidosis, unspecified: Secondary | ICD-10-CM | POA: Diagnosis not present

## 2021-07-14 ENCOUNTER — Other Ambulatory Visit: Payer: Self-pay | Admitting: Pulmonary Disease

## 2021-07-14 DIAGNOSIS — D869 Sarcoidosis, unspecified: Secondary | ICD-10-CM

## 2021-07-29 ENCOUNTER — Other Ambulatory Visit: Payer: Self-pay

## 2021-07-29 ENCOUNTER — Ambulatory Visit
Admission: RE | Admit: 2021-07-29 | Discharge: 2021-07-29 | Disposition: A | Discharge status: Home / Self Care | Payer: Medicare HMO | Source: Ambulatory Visit | Attending: Pulmonary Disease | Admitting: Pulmonary Disease

## 2021-07-29 DIAGNOSIS — J479 Bronchiectasis, uncomplicated: Secondary | ICD-10-CM | POA: Diagnosis not present

## 2021-07-29 DIAGNOSIS — D869 Sarcoidosis, unspecified: Secondary | ICD-10-CM | POA: Diagnosis not present

## 2021-07-29 DIAGNOSIS — R918 Other nonspecific abnormal finding of lung field: Secondary | ICD-10-CM | POA: Diagnosis not present

## 2021-07-29 DIAGNOSIS — J984 Other disorders of lung: Secondary | ICD-10-CM | POA: Diagnosis not present

## 2021-07-31 DIAGNOSIS — D860 Sarcoidosis of lung: Secondary | ICD-10-CM | POA: Diagnosis not present

## 2021-08-05 DIAGNOSIS — E119 Type 2 diabetes mellitus without complications: Secondary | ICD-10-CM | POA: Diagnosis not present

## 2021-08-05 DIAGNOSIS — R112 Nausea with vomiting, unspecified: Secondary | ICD-10-CM | POA: Diagnosis not present

## 2021-08-05 DIAGNOSIS — Z03818 Encounter for observation for suspected exposure to other biological agents ruled out: Secondary | ICD-10-CM | POA: Diagnosis not present

## 2021-09-17 ENCOUNTER — Encounter: Payer: Self-pay | Admitting: Hematology and Oncology

## 2021-10-12 ENCOUNTER — Other Ambulatory Visit: Payer: Self-pay | Admitting: Oncology

## 2021-10-12 DIAGNOSIS — C50411 Malignant neoplasm of upper-outer quadrant of right female breast: Secondary | ICD-10-CM

## 2021-10-13 ENCOUNTER — Encounter: Payer: Self-pay | Admitting: Hematology and Oncology

## 2021-12-10 DIAGNOSIS — M25541 Pain in joints of right hand: Secondary | ICD-10-CM | POA: Diagnosis not present

## 2021-12-16 DIAGNOSIS — I10 Essential (primary) hypertension: Secondary | ICD-10-CM | POA: Diagnosis not present

## 2021-12-24 DIAGNOSIS — D869 Sarcoidosis, unspecified: Secondary | ICD-10-CM | POA: Diagnosis not present

## 2021-12-24 DIAGNOSIS — E7800 Pure hypercholesterolemia, unspecified: Secondary | ICD-10-CM | POA: Diagnosis not present

## 2021-12-24 DIAGNOSIS — I129 Hypertensive chronic kidney disease with stage 1 through stage 4 chronic kidney disease, or unspecified chronic kidney disease: Secondary | ICD-10-CM | POA: Diagnosis not present

## 2021-12-24 DIAGNOSIS — N1832 Chronic kidney disease, stage 3b: Secondary | ICD-10-CM | POA: Diagnosis not present

## 2021-12-24 DIAGNOSIS — Z6839 Body mass index (BMI) 39.0-39.9, adult: Secondary | ICD-10-CM | POA: Diagnosis not present

## 2021-12-24 DIAGNOSIS — G4733 Obstructive sleep apnea (adult) (pediatric): Secondary | ICD-10-CM | POA: Diagnosis not present

## 2021-12-24 DIAGNOSIS — K760 Fatty (change of) liver, not elsewhere classified: Secondary | ICD-10-CM | POA: Diagnosis not present

## 2022-01-06 DIAGNOSIS — Z800 Family history of malignant neoplasm of digestive organs: Secondary | ICD-10-CM | POA: Diagnosis not present

## 2022-01-06 DIAGNOSIS — Z79631 Long term (current) use of antimetabolite agent: Secondary | ICD-10-CM | POA: Diagnosis not present

## 2022-01-06 DIAGNOSIS — K760 Fatty (change of) liver, not elsewhere classified: Secondary | ICD-10-CM | POA: Diagnosis not present

## 2022-01-06 DIAGNOSIS — R7989 Other specified abnormal findings of blood chemistry: Secondary | ICD-10-CM | POA: Diagnosis not present

## 2022-01-06 DIAGNOSIS — K219 Gastro-esophageal reflux disease without esophagitis: Secondary | ICD-10-CM | POA: Diagnosis not present

## 2022-02-03 DIAGNOSIS — G4733 Obstructive sleep apnea (adult) (pediatric): Secondary | ICD-10-CM | POA: Diagnosis not present

## 2022-02-03 DIAGNOSIS — D860 Sarcoidosis of lung: Secondary | ICD-10-CM | POA: Diagnosis not present

## 2022-03-02 ENCOUNTER — Encounter: Payer: Self-pay | Admitting: Gastroenterology

## 2022-03-02 ENCOUNTER — Ambulatory Visit: Payer: Medicare HMO | Admitting: Certified Registered Nurse Anesthetist

## 2022-03-02 ENCOUNTER — Ambulatory Visit
Admission: RE | Admit: 2022-03-02 | Discharge: 2022-03-02 | Disposition: A | Discharge status: Home / Self Care | Payer: Medicare HMO | Attending: Gastroenterology | Admitting: Gastroenterology

## 2022-03-02 ENCOUNTER — Encounter
Admission: RE | Disposition: A | Discharge status: Home / Self Care | Payer: Self-pay | Source: Home / Self Care | Attending: Gastroenterology

## 2022-03-02 DIAGNOSIS — I129 Hypertensive chronic kidney disease with stage 1 through stage 4 chronic kidney disease, or unspecified chronic kidney disease: Secondary | ICD-10-CM | POA: Diagnosis not present

## 2022-03-02 DIAGNOSIS — D122 Benign neoplasm of ascending colon: Secondary | ICD-10-CM | POA: Insufficient documentation

## 2022-03-02 DIAGNOSIS — G4730 Sleep apnea, unspecified: Secondary | ICD-10-CM | POA: Diagnosis not present

## 2022-03-02 DIAGNOSIS — K635 Polyp of colon: Secondary | ICD-10-CM | POA: Diagnosis not present

## 2022-03-02 DIAGNOSIS — Z800 Family history of malignant neoplasm of digestive organs: Secondary | ICD-10-CM | POA: Diagnosis not present

## 2022-03-02 DIAGNOSIS — K640 First degree hemorrhoids: Secondary | ICD-10-CM | POA: Diagnosis not present

## 2022-03-02 DIAGNOSIS — D126 Benign neoplasm of colon, unspecified: Secondary | ICD-10-CM | POA: Diagnosis not present

## 2022-03-02 DIAGNOSIS — Z905 Acquired absence of kidney: Secondary | ICD-10-CM | POA: Insufficient documentation

## 2022-03-02 DIAGNOSIS — K5140 Inflammatory polyps of colon without complications: Secondary | ICD-10-CM | POA: Diagnosis not present

## 2022-03-02 DIAGNOSIS — N189 Chronic kidney disease, unspecified: Secondary | ICD-10-CM | POA: Diagnosis not present

## 2022-03-02 DIAGNOSIS — D869 Sarcoidosis, unspecified: Secondary | ICD-10-CM | POA: Insufficient documentation

## 2022-03-02 DIAGNOSIS — Z79899 Other long term (current) drug therapy: Secondary | ICD-10-CM | POA: Insufficient documentation

## 2022-03-02 DIAGNOSIS — Z1211 Encounter for screening for malignant neoplasm of colon: Secondary | ICD-10-CM | POA: Insufficient documentation

## 2022-03-02 DIAGNOSIS — Z6836 Body mass index (BMI) 36.0-36.9, adult: Secondary | ICD-10-CM | POA: Diagnosis not present

## 2022-03-02 DIAGNOSIS — E669 Obesity, unspecified: Secondary | ICD-10-CM | POA: Diagnosis not present

## 2022-03-02 DIAGNOSIS — Z09 Encounter for follow-up examination after completed treatment for conditions other than malignant neoplasm: Secondary | ICD-10-CM | POA: Diagnosis not present

## 2022-03-02 DIAGNOSIS — K219 Gastro-esophageal reflux disease without esophagitis: Secondary | ICD-10-CM | POA: Diagnosis not present

## 2022-03-02 HISTORY — PX: COLONOSCOPY WITH PROPOFOL: SHX5780

## 2022-03-02 SURGERY — COLONOSCOPY WITH PROPOFOL
Anesthesia: General

## 2022-03-02 MED ORDER — PROPOFOL 10 MG/ML IV BOLUS
INTRAVENOUS | Status: DC | PRN
  Administered 2022-03-02: 60 mg via INTRAVENOUS

## 2022-03-02 MED ORDER — PROPOFOL 500 MG/50ML IV EMUL
INTRAVENOUS | Status: DC | PRN
  Administered 2022-03-02: 150 ug/kg/min via INTRAVENOUS

## 2022-03-02 MED ORDER — SODIUM CHLORIDE 0.9 % IV SOLN
INTRAVENOUS | Status: DC
  Administered 2022-03-02: 20 mL/h via INTRAVENOUS

## 2022-03-02 MED ORDER — LIDOCAINE HCL (CARDIAC) PF 100 MG/5ML IV SOSY
PREFILLED_SYRINGE | INTRAVENOUS | Status: DC | PRN
  Administered 2022-03-02: 50 mg via INTRAVENOUS

## 2022-03-02 NOTE — Transfer of Care (Signed)
Immediate Anesthesia Transfer of Care Note  Patient: Jill Bond  Procedure(s) Performed: COLONOSCOPY WITH PROPOFOL  Patient Location: PACU  Anesthesia Type:General  Level of Consciousness: drowsy  Airway & Oxygen Therapy: Patient Spontanous Breathing  Post-op Assessment: Report given to RN and Post -op Vital signs reviewed and stable  Post vital signs: Reviewed and stable  Last Vitals:  Vitals Value Taken Time  BP 152/62 03/02/22 1107  Temp    Pulse 78 03/02/22 1108  Resp 15 03/02/22 1108  SpO2 96 % 03/02/22 1108  Vitals shown include unvalidated device data.  Last Pain:  Vitals:   03/02/22 1107  TempSrc:   PainSc: 0-No pain         Complications: No notable events documented.

## 2022-03-02 NOTE — Op Note (Signed)
[facility] Patient Name: Jill Bond Procedure Date: 03/02/2022 10:36 AM MRN: [id number] Account #: [id number] Date of Birth: 06/04/1950 Admit Type: Outpatient Age: 71 Room: [facility] ENDO ROOM 3 Gender: Female Note Status: Finalized Instrument Name: Jasper Riling [id number] Procedure:             Colonoscopy Indications:           Screening patient at increased risk: Family history of                         1st-degree relative with colorectal cancer at age 87                         years (or older) Providers:             Andrey Farmer MD, MD Referring MD:          Andrey Farmer MD, MD (Referring MD), Baxter Hire, MD (Referring MD) Medicines:             Monitored Anesthesia Care Complications:         No immediate complications. Estimated blood loss:                         Minimal. Procedure:             Pre-Anesthesia Assessment:                        - Prior to the procedure, a History and Physical was                         performed, and patient medications and allergies were                         reviewed. The patient is competent. The risks and                         benefits of the procedure and the sedation options and                         risks were discussed with the patient. All questions                         were answered and informed consent was obtained.                         Patient identification and proposed procedure were                         verified by the physician, the nurse, the                         anesthesiologist, the anesthetist and the technician                         in the endoscopy suite. Mental Status Examination:  alert and oriented. Airway Examination: normal                         oropharyngeal airway and neck mobility. Respiratory                         Examination: clear to auscultation. CV Examination:                          normal. Prophylactic Antibiotics: The patient does not                         require prophylactic antibiotics. Prior                         Anticoagulants: The patient has taken no previous                         anticoagulant or antiplatelet agents. ASA Grade                         Assessment: III - A patient with severe systemic                         disease. After reviewing the risks and benefits, the                         patient was deemed in satisfactory condition to                         undergo the procedure. The anesthesia plan was to use                         monitored anesthesia care (MAC). Immediately prior to                         administration of medications, the patient was                         re-assessed for adequacy to receive sedatives. The                         heart rate, respiratory rate, oxygen saturations,                         blood pressure, adequacy of pulmonary ventilation, and                         response to care were monitored throughout the                         procedure. The physical status of the patient was                         re-assessed after the procedure.                        After obtaining informed consent, the colonoscope was  passed under direct vision. Throughout the procedure,                         the patient's blood pressure, pulse, and oxygen                         saturations were monitored continuously. The                         Colonoscope was introduced through the anus and                         advanced to the the cecum, identified by appendiceal                         orifice and ileocecal valve. The colonoscopy was                         somewhat difficult due to a redundant colon. The                         patient tolerated the procedure well. The quality of                         the bowel preparation was fair. Findings:      The perianal and digital rectal  examinations were normal.      A 3 mm polyp was found in the cecum. The polyp was likely an       inflammatory polyp. There was unusual bleeding after polypectomy. To       prevent bleeding post-intervention, two hemostatic clips were       successfully placed. Bleeding had stopped at the end of the procedure.      A 5 mm polyp was found in the ascending colon. The polyp was sessile.       The polyp was removed with a cold snare. Resection and retrieval were       complete. Estimated blood loss was minimal.      Internal hemorrhoids were found during retroflexion. The hemorrhoids       were Grade I (internal hemorrhoids that do not prolapse).      The exam was otherwise without abnormality on direct and retroflexion       views. Impression:            - Preparation of the colon was fair.                        - One 3 mm polyp in the cecum. Clips were placed.                        - One 5 mm polyp in the ascending colon, removed with                         a cold snare. Resected and retrieved.                        - Internal hemorrhoids.                        - The  examination was otherwise normal on direct and                         retroflexion views. Recommendation:        - Discharge patient to home.                        - Resume previous diet.                        - Continue present medications.                        - Await pathology results.                        - Repeat colonoscopy in 1 year because the bowel                         preparation was suboptimal.                        - Return to referring physician as previously                         scheduled. Procedure Code(s):     --- Professional ---                        [phone], Colonoscopy, flexible; with removal of                         tumor(s), polyp(s), or other lesion(s) by snare                         technique Diagnosis Code(s):     --- Professional ---                        Z80.0, Family history  of malignant neoplasm of                         digestive organs                        K64.0, First degree hemorrhoids                        K63.5, Polyp of colon CPT copyright 2019 [employer] Medical [employer]. All rights reserved. The codes documented in this report are preliminary and upon coder review may  be revised to meet current compliance requirements. Andrey Farmer MD, MD 03/02/2022 11:09:49 AM Number of Addenda: 0 Note Initiated On: 03/02/2022 10:36 AM Scope Withdrawal Time: 0 hours 11 minutes 20 seconds  Total Procedure Duration: 0 hours 16 minutes 4 seconds  Estimated Blood Loss:  Estimated blood loss was minimal.      [facility]

## 2022-03-02 NOTE — Anesthesia Postprocedure Evaluation (Signed)
Anesthesia Post Note  Patient: Jill Bond  Procedure(s) Performed: COLONOSCOPY WITH PROPOFOL  Patient location during evaluation: PACU Anesthesia Type: General Level of consciousness: awake and oriented Pain management: pain level controlled Vital Signs Assessment: post-procedure vital signs reviewed and stable Respiratory status: spontaneous breathing Cardiovascular status: stable Anesthetic complications: no   No notable events documented.   Last Vitals:  Vitals:   03/02/22 1118 03/02/22 1128  BP: 137/69   Pulse:    Resp: 16 16  Temp: (!) 35.7 C   SpO2:      Last Pain:  Vitals:   03/02/22 1128  TempSrc:   PainSc: 0-No pain                 VAN STAVEREN,Greta Yung

## 2022-03-02 NOTE — Anesthesia Preprocedure Evaluation (Signed)
Anesthesia Evaluation  Patient identified by MRN, date of birth, ID band Patient awake    Reviewed: Allergy & Precautions, NPO status , Patient's Chart, lab work & pertinent test results  Airway Mallampati: II  TM Distance: >3 FB Neck ROM: Full    Dental  (+) Teeth Intact   Pulmonary shortness of breath and with exertion, sleep apnea and Continuous Positive Airway Pressure Ventilation ,  Hx of Sarcoidosis    + decreased breath sounds      Cardiovascular Exercise Tolerance: Good hypertension, Pt. on medications  Rhythm:Regular     Neuro/Psych    GI/Hepatic Neg liver ROS, GERD  Medicated,  Endo/Other  Morbid obesity  Renal/GU Renal diseaseS/p l nephrectomy     Musculoskeletal   Abdominal (+) + obese,   Peds negative pediatric ROS (+)  Hematology negative hematology ROS (+)   Anesthesia Other Findings   Reproductive/Obstetrics                             Anesthesia Physical Anesthesia Plan  ASA: 3  Anesthesia Plan: General   Post-op Pain Management:    Induction: Intravenous  PONV Risk Score and Plan:   Airway Management Planned: Natural Airway  Additional Equipment:   Intra-op Plan:   Post-operative Plan:   Informed Consent: I have reviewed the patients History and Physical, chart, labs and discussed the procedure including the risks, benefits and alternatives for the proposed anesthesia with the patient or authorized representative who has indicated his/her understanding and acceptance.       Plan Discussed with: CRNA, Anesthesiologist and Surgeon  Anesthesia Plan Comments:         Anesthesia Quick Evaluation

## 2022-03-02 NOTE — Interval H&P Note (Signed)
History and Physical Interval Note:  03/02/2022 10:41 AM  Jill Bond  has presented today for surgery, with the diagnosis of FH Colon Cancer.  The various methods of treatment have been discussed with the patient and family. After consideration of risks, benefits and other options for treatment, the patient has consented to  Procedure(s): COLONOSCOPY WITH PROPOFOL (N/A) as a surgical intervention.  The patient's history has been reviewed, patient examined, no change in status, stable for surgery.  I have reviewed the patient's chart and labs.  Questions were answered to the patient's satisfaction.     Lesly Rubenstein  Ok to proceed with colonoscopy

## 2022-03-02 NOTE — Anesthesia Procedure Notes (Signed)
Procedure Name: MAC Date/Time: 03/02/2022 10:40 AM  Performed by: Tollie Eth, CRNAPre-anesthesia Checklist: Patient identified, Emergency Drugs available, Suction available and Patient being monitored Patient Re-evaluated:Patient Re-evaluated prior to induction Oxygen Delivery Method: Nasal cannula Induction Type: IV induction Placement Confirmation: positive ETCO2

## 2022-03-02 NOTE — H&P (Signed)
Outpatient short stay form Pre-procedure 03/02/2022  Lesly Rubenstein, MD  Primary Physician: Baxter Hire, MD  Reason for visit:  Screening  History of present illness:    71 y/o lady with history of NAFLD, sarcoid on methotrexate, and obesity here for screening colonoscopy. Last colonoscopy in 2013 was unremarkable. No blood thinners. Father had colon cancer in his 73's. History of nephrectomy.    Current Facility-Administered Medications:    0.9 %  sodium chloride infusion, , Intravenous, Continuous, Arvell Pulsifer, Hilton Cork, MD, Last Rate: 20 mL/hr at 03/02/22 1022, 20 mL/hr at 03/02/22 1022  Medications Prior to Admission  Medication Sig Dispense Refill Last Dose   amLODipine (NORVASC) 10 MG tablet Take 10 mg by mouth at bedtime.   03/02/2022 at 0730   cyclobenzaprine (FLEXERIL) 10 MG tablet Take 1 tablet by mouth 2 (two) times daily.    03/01/2022   diclofenac sodium (VOLTAREN) 1 % GEL Apply 1 application topically 2 (two) times daily as needed (foot pain).   03/01/2022   docusate sodium (COLACE) 100 MG capsule Take 300 mg by mouth at bedtime.   32/44/0102   folic acid (FOLVITE) 1 MG tablet Take 1 mg by mouth daily.   03/01/2022   letrozole (FEMARA) 2.5 MG tablet TAKE 1 TABLET EVERY DAY 90 tablet 0 03/01/2022   methotrexate (RHEUMATREX) 2.5 MG tablet TAKE 4 TABLETS (10 MG TOTAL) BY MOUTH ONCE A WEEK. CAUTION:CHEMOTHERAPY. PROTECT FROM LIGHT. (Patient taking differently: Take 10 mg by mouth every Sunday. Caution:Chemotherapy. Protect from light.) 52 tablet 2 03/01/2022   metoprolol (TOPROL-XL) 200 MG 24 hr tablet Take 100 mg by mouth daily.   03/01/2022   omeprazole (PRILOSEC) 20 MG capsule Take 20 mg by mouth daily.    03/01/2022   aspirin-acetaminophen-caffeine (MIGRAINE RELIEF) 250-250-65 MG tablet Take 1 tablet by mouth every 6 (six) hours as needed for headache. (Patient not taking: Reported on 07/02/2020)      Multiple Vitamin (MULTIVITAMIN WITH MINERALS) TABS tablet Take 1  tablet by mouth daily.  (Patient not taking: Reported on 06/19/2021)      nystatin (MYCOSTATIN/NYSTOP) powder Apply 1 application topically 3 (three) times daily. (Patient not taking: Reported on 06/19/2021) 15 g 0    Omega-3 Fatty Acids (OMEGA 3 500) 500 MG CAPS Take 500 mg by mouth daily. (Patient not taking: Reported on 06/19/2021)      ondansetron (ZOFRAN) 4 MG tablet Take 1 tablet (4 mg total) by mouth every 8 (eight) hours as needed for nausea or vomiting. (Patient not taking: Reported on 06/19/2021) 10 tablet 0    silver sulfADIAZINE (SILVADENE) 1 % cream Apply 1 application topically daily as needed (skin irritation). (Patient not taking: Reported on 06/19/2021)      vitamin E 180 MG (400 UNITS) capsule Take 800 Units by mouth daily. (Patient not taking: Reported on 06/19/2021)        Allergies  Allergen Reactions   Codeine Nausea And Vomiting   Penicillins Hives and Other (See Comments)    Has patient had a PCN reaction causing immediate rash, facial/tongue/throat swelling, SOB or lightheadedness with hypotension: no Has patient had a PCN reaction causing severe rash involving mucus membranes or skin necrosis: no Has patient had a PCN reaction that required hospitalization no Has patient had a PCN reaction occurring within the last 10 years: no If all of the above answers are "NO", then may proceed with Cephalosporin use.      Past Medical History:  Diagnosis Date   Anxiety  Arthritis    Breast cancer ([place]) 01/2019   invasive mammary carcinoma    Chronic kidney disease 2006   Left Nephrectomy   Complication of anesthesia    PONV   Constipation    Depression    GERD (gastroesophageal reflux disease)    Headache    migraines   Hypertension    Personal history of radiation therapy    PONV (postoperative nausea and vomiting)    Rotator cuff disorder    LEFT   Sarcoidosis    Shortness of breath dyspnea    with exertion   Sleep apnea    Trigger finger, right     Review of  systems:  Otherwise negative.    Physical Exam  Gen: Alert, oriented. Appears stated age.  HEENT: PERRLA. Lungs: No respiratory distress CV: RRR Abd: soft, benign, no masses Ext: No edema    Planned procedures: Proceed with colonoscopy. The patient understands the nature of the planned procedure, indications, risks, alternatives and potential complications including but not limited to bleeding, infection, perforation, damage to internal organs and possible oversedation/side effects from anesthesia. The patient agrees and gives consent to proceed.  Please refer to procedure notes for findings, recommendations and patient disposition/instructions.     Lesly Rubenstein, MD [facility]

## 2022-03-03 LAB — SURGICAL PATHOLOGY

## 2022-03-17 ENCOUNTER — Other Ambulatory Visit: Payer: Self-pay | Admitting: General Surgery

## 2022-03-17 DIAGNOSIS — Z1231 Encounter for screening mammogram for malignant neoplasm of breast: Secondary | ICD-10-CM

## 2022-03-20 DIAGNOSIS — M79671 Pain in right foot: Secondary | ICD-10-CM | POA: Diagnosis not present

## 2022-03-31 DIAGNOSIS — R600 Localized edema: Secondary | ICD-10-CM | POA: Diagnosis not present

## 2022-04-10 DIAGNOSIS — D869 Sarcoidosis, unspecified: Secondary | ICD-10-CM | POA: Diagnosis not present

## 2022-04-10 DIAGNOSIS — R7989 Other specified abnormal findings of blood chemistry: Secondary | ICD-10-CM | POA: Diagnosis not present

## 2022-04-10 DIAGNOSIS — K760 Fatty (change of) liver, not elsewhere classified: Secondary | ICD-10-CM | POA: Diagnosis not present

## 2022-04-10 DIAGNOSIS — Z0100 Encounter for examination of eyes and vision without abnormal findings: Secondary | ICD-10-CM | POA: Diagnosis not present

## 2022-04-20 ENCOUNTER — Ambulatory Visit
Admission: RE | Admit: 2022-04-20 | Discharge: 2022-04-20 | Disposition: A | Discharge status: Home / Self Care | Payer: Medicare HMO | Source: Ambulatory Visit | Attending: General Surgery | Admitting: General Surgery

## 2022-04-20 DIAGNOSIS — Z1231 Encounter for screening mammogram for malignant neoplasm of breast: Secondary | ICD-10-CM | POA: Diagnosis not present

## 2022-04-28 ENCOUNTER — Other Ambulatory Visit: Payer: Self-pay

## 2022-04-28 DIAGNOSIS — Z170 Estrogen receptor positive status [ER+]: Secondary | ICD-10-CM

## 2022-04-28 DIAGNOSIS — Z853 Personal history of malignant neoplasm of breast: Secondary | ICD-10-CM | POA: Diagnosis not present

## 2022-04-28 DIAGNOSIS — Z08 Encounter for follow-up examination after completed treatment for malignant neoplasm: Secondary | ICD-10-CM | POA: Diagnosis not present

## 2022-04-28 MED ORDER — LETROZOLE 2.5 MG PO TABS: 2.5000 mg | ORAL_TABLET | Freq: Every day | ORAL | 0 refills | Status: DC

## 2022-04-29 ENCOUNTER — Encounter: Payer: Self-pay | Admitting: Oncology

## 2022-04-29 ENCOUNTER — Inpatient Hospital Stay: Payer: Medicare HMO | Attending: Oncology | Admitting: Oncology

## 2022-04-29 VITALS — BP 143/85 | HR 84 | Temp 98.0°F | Resp 16 | Ht 64.0 in | Wt 215.0 lb

## 2022-04-29 DIAGNOSIS — Z79899 Other long term (current) drug therapy: Secondary | ICD-10-CM | POA: Diagnosis not present

## 2022-04-29 DIAGNOSIS — C50411 Malignant neoplasm of upper-outer quadrant of right female breast: Secondary | ICD-10-CM | POA: Diagnosis not present

## 2022-04-29 DIAGNOSIS — C50911 Malignant neoplasm of unspecified site of right female breast: Secondary | ICD-10-CM | POA: Diagnosis not present

## 2022-04-29 DIAGNOSIS — Z79811 Long term (current) use of aromatase inhibitors: Secondary | ICD-10-CM | POA: Insufficient documentation

## 2022-04-29 DIAGNOSIS — Z923 Personal history of irradiation: Secondary | ICD-10-CM | POA: Diagnosis not present

## 2022-04-29 DIAGNOSIS — D869 Sarcoidosis, unspecified: Secondary | ICD-10-CM | POA: Insufficient documentation

## 2022-04-29 DIAGNOSIS — Z170 Estrogen receptor positive status [ER+]: Secondary | ICD-10-CM | POA: Diagnosis not present

## 2022-04-29 MED ORDER — LETROZOLE 2.5 MG PO TABS: 2.5000 mg | ORAL_TABLET | Freq: Every day | ORAL | 3 refills | Status: DC

## 2022-04-29 NOTE — Progress Notes (Signed)
Hematology/Oncology Consult note [facility]  [phone] [phone]  Patient Care Team: Baxter Hire, MD as PCP - General (Internal Medicine) Laverle Hobby, MD as Consulting Physician (Pulmonary Disease) Herbert Pun, MD as Consulting Physician (General Surgery) Noreene Filbert, MD as Referring Physician (Radiation Oncology) Theodore Demark, RN (Inactive) as Oncology Nurse Navigator Weidman, Benay Pike, MD as Consulting Physician (Gastroenterology) Rodney Booze as Physician Assistant Ottie Glazier, MD as Consulting Physician (Pulmonary Disease) Sindy Guadeloupe, MD as Consulting Physician (Oncology)   Name of the patient: Jill Bond  [id number]  May 11, 1951   Date of visit: 04/29/22  Diagnosis- history of stage Ia right breast cancer ER/PR positive HER2 negative   Chief complaint/ Reason for visit-routine follow-up of breast cancer on letrozole  Heme/Onc history: Patient is a 71 year old female diagnosed with stage Ia right breast cancer in September 2020. 1.2 cm grade 1 invasive mammary carcinoma with low-grade DCIS. 1 out of 3 sentinel lymph nodes positive for micrometastatic carcinoma. Tumor greater than 90% ER positive, PR 51 to 90% positive and HER2 negative. Oncotype score was 16 and she did not require any adjuvant chemotherapy. Patient also has a history of sarcoidosis biopsy-proven on supraclavicular lymph node biopsy. Mild hypercalcemia secondary to that. History of multiple angiomyolipoma s/p left nephrectomy in 2007. Baseline bone density scan normal. She began Femara in February 2021. Also in July 2021 there was some enhancement noted at the base of the right nipple. She had an excisional biopsy which showed focal duct ectasia and usual ductal hyperplasia but no evidence of malignancy   Interval history-patient was lost to follow-up after she last saw me in June 2022.  She has been out of  letrozole for the last 1 month.  She has been tolerating letrozole well otherwise without any significant side effects.  She is not currently taking any calcium or vitamin D supplements due to her history of sarcoidosis and hypercalcemia.  ECOG PS- 1 Pain scale- 0  Review of systems- Review of Systems  Constitutional:  Negative for chills, fever, malaise/fatigue and weight loss.  HENT:  Negative for congestion, ear discharge and nosebleeds.   Eyes:  Negative for blurred vision.  Respiratory:  Negative for cough, hemoptysis, sputum production, shortness of breath and wheezing.   Cardiovascular:  Negative for chest pain, palpitations, orthopnea and claudication.  Gastrointestinal:  Negative for abdominal pain, blood in stool, constipation, diarrhea, heartburn, melena, nausea and vomiting.  Genitourinary:  Negative for dysuria, flank pain, frequency, hematuria and urgency.  Musculoskeletal:  Negative for back pain, joint pain and myalgias.  Skin:  Negative for rash.  Neurological:  Negative for dizziness, tingling, focal weakness, seizures, weakness and headaches.  Endo/Heme/Allergies:  Does not bruise/bleed easily.  Psychiatric/Behavioral:  Negative for depression and suicidal ideas. The patient does not have insomnia.       Allergies  Allergen Reactions   Codeine Nausea And Vomiting   Penicillins Hives and Other (See Comments)    Has patient had a PCN reaction causing immediate rash, facial/tongue/throat swelling, SOB or lightheadedness with hypotension: no Has patient had a PCN reaction causing severe rash involving mucus membranes or skin necrosis: no Has patient had a PCN reaction that required hospitalization no Has patient had a PCN reaction occurring within the last 10 years: no If all of the above answers are "NO", then may proceed with Cephalosporin use.      Past Medical History:  Diagnosis Date   Anxiety  Arthritis    Breast cancer ([place]) 01/2019   invasive mammary  carcinoma    Chronic kidney disease 2006   Left Nephrectomy   Complication of anesthesia    PONV   Constipation    Depression    GERD (gastroesophageal reflux disease)    Headache    migraines   Hypertension    Personal history of radiation therapy    PONV (postoperative nausea and vomiting)    Rotator cuff disorder    LEFT   Sarcoidosis    Shortness of breath dyspnea    with exertion   Sleep apnea    Trigger finger, right      Past Surgical History:  Procedure Laterality Date   ABDOMINAL HYSTERECTOMY     PARTIAL   ACHILLES TENDON SURGERY Left 02/22/2015   Procedure: Secondary ACHILLES TENDON REPAIR;  Surgeon: Albertine Patricia, DPM;  Location: [facility] ORS;  Service: Podiatry;  Laterality: Left;   Bladder tac     BREAST BIOPSY Right 01/23/2019   affirm stereo bx of calcs with x marker, ductal hyperplasia   BREAST BIOPSY Right 01/23/2019   [place] bx of mass with coil marker, invasive mammary carcinoma/DCIS   BREAST EXCISIONAL BIOPSY Left 2003   neg   BREAST LUMPECTOMY Right 02/06/2019   IMC/DCIS clear margins, ONE LYMPH NODE WITH 1.2 MM FOCUS OF MICRO METASTATIC CARCINOMA   COLONOSCOPY  2013   COLONOSCOPY WITH PROPOFOL N/A 03/02/2022   Procedure: COLONOSCOPY WITH PROPOFOL;  Surgeon: Lesly Rubenstein, MD;  Location: [facility];  Service: Endoscopy;  Laterality: N/A;   EXCISION / BIOPSY BREAST / NIPPLE / DUCT Right 01/19/2020   duct excision due to nipple d/c, Usual ductal hyperplasia, patchy stromal fibrosis. No atypia or malignancy   EXCISION OF BREAST BIOPSY Right 01/19/2020   Procedure: EXCISION OF BREAST BIOPSY;  Surgeon: Herbert Pun, MD;  Location: [facility] ORS;  Service: General;  Laterality: Right;   JOINT REPLACEMENT Left 2007   Total Knee Replacement   KIDNEY CYST REMOVAL Left    NEPHRECTOMY Left    OSTECTOMY Left 02/22/2015   Procedure: OSTECTOMY;  Surgeon: Albertine Patricia, DPM;  Location: [facility] ORS;  Service: Podiatry;  Laterality: Left;   PARTIAL  MASTECTOMY WITH NEEDLE LOCALIZATION AND AXILLARY SENTINEL LYMPH NODE BX Right 02/06/2019   Procedure: PARTIAL MASTECTOMY WITH NEEDLE LOCALIZATION AND AXILLARY SENTINEL LYMPH NODE BX;  Surgeon: Herbert Pun, MD;  Location: [facility] OR[facility];  Service: General;  Laterality: Right;   SHOULDER ARTHROSCOPY W/ ROTATOR CUFF REPAIR Left YRS AGO   TONSILLECTOMY  AGE 77   TOTAL KNEE ARTHROPLASTY Right 10/22/2015   Procedure: RIGHT TOTAL KNEE ARTHROPLASTY;  Surgeon: Paralee Cancel, MD;  Location: [facility];  Service: Orthopedics;  Laterality: Right;   TOTAL KNEE ARTHROPLASTY Right    10/22/2015    Social History   Socioeconomic History   Marital status: Widowed    Spouse name: Not on file   Number of children: Not on file   Years of education: Not on file   Highest education level: Not on file  Occupational History   Not on file  Tobacco Use   Smoking status: Never   Smokeless tobacco: Never  Vaping Use   Vaping Use: Never used  Substance and Sexual Activity   Alcohol use: No   Drug use: No   Sexual activity: Not on file  Other Topics Concern   Not on file  Social History Narrative   Not on file   Social Determinants of Health  Financial Resource Strain: Not on file  Food Insecurity: Not on file  Transportation Needs: Not on file  Physical Activity: Not on file  Stress: Not on file  Social Connections: Not on file  Intimate Partner Violence: Not on file    Family History  Problem Relation Age of Onset   Breast cancer Mother 36   Heart failure Father    Prostate cancer Father      Current Outpatient Medications:    amLODipine (NORVASC) 10 MG tablet, Take 10 mg by mouth at bedtime., Disp: , Rfl:    cyclobenzaprine (FLEXERIL) 10 MG tablet, Take 1 tablet by mouth 2 (two) times daily. , Disp: , Rfl:    diclofenac sodium (VOLTAREN) 1 % GEL, Apply 1 application topically 2 (two) times daily as needed (foot pain)., Disp: , Rfl:    docusate sodium (COLACE) 100 MG capsule, Take 300 mg by  mouth at bedtime., Disp: , Rfl:    folic acid (FOLVITE) 1 MG tablet, Take 1 mg by mouth daily., Disp: , Rfl:    methotrexate (RHEUMATREX) 2.5 MG tablet, TAKE 4 TABLETS (10 MG TOTAL) BY MOUTH ONCE A WEEK. CAUTION:CHEMOTHERAPY. PROTECT FROM LIGHT. (Patient taking differently: Take 10 mg by mouth every Sunday. Caution:Chemotherapy. Protect from light.), Disp: 52 tablet, Rfl: 2   metoprolol (TOPROL-XL) 200 MG 24 hr tablet, Take 100 mg by mouth daily., Disp: , Rfl:    omeprazole (PRILOSEC) 20 MG capsule, Take 20 mg by mouth daily. , Disp: , Rfl:    aspirin-acetaminophen-caffeine (MIGRAINE RELIEF) 250-250-65 MG tablet, Take 1 tablet by mouth every 6 (six) hours as needed for headache. (Patient not taking: Reported on 07/02/2020), Disp: , Rfl:    letrozole (FEMARA) 2.5 MG tablet, Take 1 tablet (2.5 mg total) by mouth daily., Disp: 90 tablet, Rfl: 3   Multiple Vitamin (MULTIVITAMIN WITH MINERALS) TABS tablet, Take 1 tablet by mouth daily.  (Patient not taking: Reported on 06/19/2021), Disp: , Rfl:    nystatin (MYCOSTATIN/NYSTOP) powder, Apply 1 application topically 3 (three) times daily. (Patient not taking: Reported on 06/19/2021), Disp: 15 g, Rfl: 0   Omega-3 Fatty Acids (OMEGA 3 500) 500 MG CAPS, Take 500 mg by mouth daily. (Patient not taking: Reported on 06/19/2021), Disp: , Rfl:    ondansetron (ZOFRAN) 4 MG tablet, Take 1 tablet (4 mg total) by mouth every 8 (eight) hours as needed for nausea or vomiting. (Patient not taking: Reported on 06/19/2021), Disp: 10 tablet, Rfl: 0   silver sulfADIAZINE (SILVADENE) 1 % cream, Apply 1 application topically daily as needed (skin irritation). (Patient not taking: Reported on 06/19/2021), Disp: , Rfl:    vitamin E 180 MG (400 UNITS) capsule, Take 800 Units by mouth daily. (Patient not taking: Reported on 06/19/2021), Disp: , Rfl:   Physical exam:  Vitals:   04/29/22 1042  BP: (!) 143/85  Pulse: 84  Resp: 16  Temp: 98 F (36.7 C)  TempSrc: Tympanic  SpO2: 100%  Weight:  215 lb (97.5 kg)  Height: _0  (1.626 m)   Physical Exam Cardiovascular:     Rate and Rhythm: Normal rate and regular rhythm.     Heart sounds: Normal heart sounds.  Pulmonary:     Effort: Pulmonary effort is normal.     Breath sounds: Normal breath sounds.  Abdominal:     General: Bowel sounds are normal.     Palpations: Abdomen is soft.  Skin:    General: Skin is warm and dry.  Neurological:  Mental Status: She is alert and oriented to person, place, and time.   Breast exam was deferred today since patient recently had a mammogram and breast exam with Dr. Peyton Najjar     Latest Ref Rng & Units 10/30/2020   12:58 PM  CMP  Glucose 70 - 99 mg/dL 110   BUN 8 - 23 mg/dL 21   Creatinine 0.44 - 1.00 mg/dL 0.97   Sodium 135 - 145 mmol/L 138   Potassium 3.5 - 5.1 mmol/L 4.0   Chloride 98 - 111 mmol/L 104   CO2 22 - 32 mmol/L 29   Calcium 8.9 - 10.3 mg/dL 10.6   Total Protein 6.5 - 8.1 g/dL 7.4   Total Bilirubin 0.3 - 1.2 mg/dL 0.7   Alkaline Phos 38 - 126 U/L 134   AST 15 - 41 U/L 53   ALT 0 - 44 U/L 71       Latest Ref Rng & Units 10/30/2020   12:58 PM  CBC  WBC 4.0 - 10.5 K/uL 5.7   Hemoglobin 12.0 - 15.0 g/dL 13.5   Hematocrit 36.0 - 46.0 % 39.9   Platelets 150 - 400 K/uL 169     No images are attached to the encounter.  MM 3D SCREEN BREAST BILATERAL  Result Date: 04/21/2022 CLINICAL DATA:  Screening. RIGHT lumpectomy in 2020 EXAM: DIGITAL SCREENING BILATERAL MAMMOGRAM WITH TOMOSYNTHESIS AND CAD TECHNIQUE: Bilateral screening digital craniocaudal and mediolateral oblique mammograms were obtained. Bilateral screening digital breast tomosynthesis was performed. The images were evaluated with computer-aided detection. COMPARISON:  Previous exam(s). ACR Breast Density Category b: There are scattered areas of fibroglandular density. FINDINGS: There are no findings suspicious for malignancy. There is density and architectural distortion within the RIGHT breast, consistent  with postsurgical changes. These are stable in comparison to prior. IMPRESSION: No mammographic evidence of malignancy. A result letter of this screening mammogram will be mailed directly to the patient. RECOMMENDATION: Per protocol, as the patient is now 2 or more years status post lumpectomy, she may return to annual screening mammography in 1 year. However, given the history of breast cancer, the patient remains eligible for annual diagnostic mammography if preferred. (Code:SM-B-01Y) BI-RADS CATEGORY  2: Benign. Electronically Signed   By: Valentino Saxon M.D.   On: 04/21/2022 10:21     Assessment and plan- Patient is a 71 y.o. female with history of stage Ia right breast cancer ER/PR positive HER2 negative in September 2020 s/p surgery and adjuvant radiation treatment.  She is on Femara and this is a routine follow-up visit  Clinically patient is doing well with no concerning symptoms.  Recent mammogram from December 2023 was unremarkable.  She will continue taking letrozole until December 2026.  I have renewed her prescription and will continue to follow-up with her every 6 months.bone density scan next year   Visit Diagnosis 1. Use of letrozole (Femara)   2. Malignant neoplasm of upper-outer quadrant of right breast in female, estrogen receptor positive ([place])      Dr. Randa Evens, MD, MPH [facility] at [facility] [phone] 04/29/2022 4:51 PM

## 2022-04-29 NOTE — Progress Notes (Signed)
Has not taken letrazole in a month. Needs refill sent to mail order pharmacy and short supply sent to local pharmacy.

## 2022-06-18 ENCOUNTER — Ambulatory Visit: Payer: Medicare HMO | Attending: Radiation Oncology | Admitting: Radiation Oncology

## 2022-06-30 DIAGNOSIS — N1832 Chronic kidney disease, stage 3b: Secondary | ICD-10-CM | POA: Diagnosis not present

## 2022-07-01 DIAGNOSIS — N1832 Chronic kidney disease, stage 3b: Secondary | ICD-10-CM | POA: Diagnosis not present

## 2022-07-07 DIAGNOSIS — D869 Sarcoidosis, unspecified: Secondary | ICD-10-CM | POA: Diagnosis not present

## 2022-07-07 DIAGNOSIS — Z23 Encounter for immunization: Secondary | ICD-10-CM | POA: Diagnosis not present

## 2022-07-07 DIAGNOSIS — I129 Hypertensive chronic kidney disease with stage 1 through stage 4 chronic kidney disease, or unspecified chronic kidney disease: Secondary | ICD-10-CM | POA: Diagnosis not present

## 2022-07-07 DIAGNOSIS — E119 Type 2 diabetes mellitus without complications: Secondary | ICD-10-CM | POA: Diagnosis not present

## 2022-07-07 DIAGNOSIS — G4733 Obstructive sleep apnea (adult) (pediatric): Secondary | ICD-10-CM | POA: Diagnosis not present

## 2022-07-07 DIAGNOSIS — Z0000 Encounter for general adult medical examination without abnormal findings: Secondary | ICD-10-CM | POA: Diagnosis not present

## 2022-07-07 DIAGNOSIS — N183 Chronic kidney disease, stage 3 unspecified: Secondary | ICD-10-CM | POA: Diagnosis not present

## 2022-07-07 DIAGNOSIS — E1122 Type 2 diabetes mellitus with diabetic chronic kidney disease: Secondary | ICD-10-CM | POA: Diagnosis not present

## 2022-07-07 DIAGNOSIS — N1832 Chronic kidney disease, stage 3b: Secondary | ICD-10-CM | POA: Diagnosis not present

## 2022-07-07 DIAGNOSIS — E7800 Pure hypercholesterolemia, unspecified: Secondary | ICD-10-CM | POA: Diagnosis not present

## 2022-07-07 DIAGNOSIS — Z0001 Encounter for general adult medical examination with abnormal findings: Secondary | ICD-10-CM | POA: Diagnosis not present

## 2022-08-04 DIAGNOSIS — D860 Sarcoidosis of lung: Secondary | ICD-10-CM | POA: Diagnosis not present

## 2022-08-04 DIAGNOSIS — J849 Interstitial pulmonary disease, unspecified: Secondary | ICD-10-CM | POA: Diagnosis not present

## 2022-08-04 DIAGNOSIS — R7401 Elevation of levels of liver transaminase levels: Secondary | ICD-10-CM | POA: Diagnosis not present

## 2022-10-27 ENCOUNTER — Encounter: Payer: Self-pay | Admitting: Oncology

## 2022-10-27 ENCOUNTER — Inpatient Hospital Stay: Payer: Medicare HMO | Attending: Oncology | Admitting: Oncology

## 2022-10-27 VITALS — BP 119/70 | HR 73 | Temp 96.9°F | Resp 19 | Wt 208.2 lb

## 2022-10-27 DIAGNOSIS — Z170 Estrogen receptor positive status [ER+]: Secondary | ICD-10-CM | POA: Insufficient documentation

## 2022-10-27 DIAGNOSIS — R7989 Other specified abnormal findings of blood chemistry: Secondary | ICD-10-CM | POA: Insufficient documentation

## 2022-10-27 DIAGNOSIS — C50411 Malignant neoplasm of upper-outer quadrant of right female breast: Secondary | ICD-10-CM

## 2022-10-27 DIAGNOSIS — C50911 Malignant neoplasm of unspecified site of right female breast: Secondary | ICD-10-CM | POA: Diagnosis not present

## 2022-10-27 DIAGNOSIS — Z79811 Long term (current) use of aromatase inhibitors: Secondary | ICD-10-CM | POA: Insufficient documentation

## 2022-10-27 NOTE — Progress Notes (Signed)
Hematology/Oncology Consult note [facility]  [phone] [phone]  Patient Care Team: Gracelyn Nurse, MD as PCP - General (Internal Medicine) Shane Crutch, MD as Consulting Physician (Pulmonary Disease) Carolan Shiver, MD as Consulting Physician (General Surgery) Carmina Miller, MD as Referring Physician (Radiation Oncology) Scarlett Presto, RN (Inactive) as Oncology Nurse Navigator [place], Boykin Nearing, MD as Consulting Physician (Gastroenterology) Abe People as Physician Assistant Vida Rigger, MD as Consulting Physician (Pulmonary Disease) Creig Hines, MD as Consulting Physician (Oncology)   Name of the patient: Jill Bond  [id number]  May 15, 1950   Date of visit: 10/27/22  Diagnosis- history of stage Ia right breast cancer ER/PR positive HER2 negative   Chief complaint/ Reason for visit-routine follow-up of breast cancer on letrozole  Heme/Onc history: Patient is a 72 year old female diagnosed with stage Ia right breast cancer in September 2020. 1.2 cm grade 1 invasive mammary carcinoma with low-grade DCIS. 1 out of 3 sentinel lymph nodes positive for micrometastatic carcinoma. Tumor greater than 90% ER positive, PR 51 to 90% positive and HER2 negative. Oncotype score was 16 and she did not require any adjuvant chemotherapy. Patient also has a history of sarcoidosis biopsy-proven on supraclavicular lymph node biopsy. Mild hypercalcemia secondary to that. History of multiple angiomyolipoma s/p left nephrectomy in 2007. Baseline bone density scan normal. She began Femara in February 2021. Also in July 2021 there was some enhancement noted at the base of the right nipple. She had an excisional biopsy which showed focal duct ectasia and usual ductal hyperplasia but no evidence of malignancy   Interval history-overall tolerating letrozole well without any significant side effects.  She is complaining of  pain along her left forearm for the last couple of weeks.  No apparent trauma.  She will be seeing her PCP for this  ECOG PS- 1 Pain scale- 0   Review of systems- Review of Systems  Musculoskeletal:        Left forearm pain      Allergies  Allergen Reactions   Codeine Nausea And Vomiting   Penicillins Hives and Other (See Comments)    Has patient had a PCN reaction causing immediate rash, facial/tongue/throat swelling, SOB or lightheadedness with hypotension: no Has patient had a PCN reaction causing severe rash involving mucus membranes or skin necrosis: no Has patient had a PCN reaction that required hospitalization no Has patient had a PCN reaction occurring within the last 10 years: no If all of the above answers are "NO", then may proceed with Cephalosporin use.      Past Medical History:  Diagnosis Date   Anxiety    Arthritis    Breast cancer (HCC) 01/2019   invasive mammary carcinoma    Chronic kidney disease 2006   Left Nephrectomy   Complication of anesthesia    PONV   Constipation    Depression    GERD (gastroesophageal reflux disease)    Headache    migraines   Hypertension    Personal history of radiation therapy    PONV (postoperative nausea and vomiting)    Rotator cuff disorder    LEFT   Sarcoidosis    Shortness of breath dyspnea    with exertion   Sleep apnea    Trigger finger, right      Past Surgical History:  Procedure Laterality Date   ABDOMINAL HYSTERECTOMY     PARTIAL   ACHILLES TENDON SURGERY Left 02/22/2015   Procedure: Secondary ACHILLES TENDON  REPAIR;  Surgeon: Recardo Evangelist, DPM;  Location: [facility];  Service: Podiatry;  Laterality: Left;   Bladder tac     BREAST BIOPSY Right 01/23/2019   affirm stereo bx of calcs with x marker, ductal hyperplasia   BREAST BIOPSY Right 01/23/2019   [place] bx of mass with coil marker, invasive mammary carcinoma/DCIS   BREAST EXCISIONAL BIOPSY Left 2003   neg   BREAST LUMPECTOMY Right 02/06/2019    [facility]/DCIS clear margins, ONE LYMPH NODE WITH 1.2 MM FOCUS OF MICRO METASTATIC CARCINOMA   COLONOSCOPY  2013   COLONOSCOPY WITH PROPOFOL N/A 03/02/2022   Procedure: COLONOSCOPY WITH PROPOFOL;  Surgeon: Regis Bill, MD;  Location: [facility];  Service: Endoscopy;  Laterality: N/A;   EXCISION / BIOPSY BREAST / NIPPLE / DUCT Right 01/19/2020   duct excision due to nipple d/c, Usual ductal hyperplasia, patchy stromal fibrosis. No atypia or malignancy   EXCISION OF BREAST BIOPSY Right 01/19/2020   Procedure: EXCISION OF BREAST BIOPSY;  Surgeon: Carolan Shiver, MD;  Location: [facility];  Service: General;  Laterality: Right;   JOINT REPLACEMENT Left 2007   Total Knee Replacement   KIDNEY CYST REMOVAL Left    NEPHRECTOMY Left    OSTECTOMY Left 02/22/2015   Procedure: OSTECTOMY;  Surgeon: Recardo Evangelist, DPM;  Location: [facility];  Service: Podiatry;  Laterality: Left;   PARTIAL MASTECTOMY WITH NEEDLE LOCALIZATION AND AXILLARY SENTINEL LYMPH NODE BX Right 02/06/2019   Procedure: PARTIAL MASTECTOMY WITH NEEDLE LOCALIZATION AND AXILLARY SENTINEL LYMPH NODE BX;  Surgeon: Carolan Shiver, MD;  Location: [facility];  Service: General;  Laterality: Right;   SHOULDER ARTHROSCOPY W/ ROTATOR CUFF REPAIR Left YRS AGO   TONSILLECTOMY  AGE 58   TOTAL KNEE ARTHROPLASTY Right 10/22/2015   Procedure: RIGHT TOTAL KNEE ARTHROPLASTY;  Surgeon: Durene Romans, MD;  Location: [facility];  Service: Orthopedics;  Laterality: Right;   TOTAL KNEE ARTHROPLASTY Right    10/22/2015    Social History   Socioeconomic History   Marital status: Widowed    Spouse name: Not on file   Number of children: Not on file   Years of education: Not on file   Highest education level: Not on file  Occupational History   Not on file  Tobacco Use   Smoking status: Never   Smokeless tobacco: Never  Vaping Use   Vaping Use: Never used  Substance and Sexual Activity   Alcohol use: No   Drug use: No   Sexual activity:  Not on file  Other Topics Concern   Not on file  Social History Narrative   Not on file   Social Determinants of Health   Financial Resource Strain: Not on file  Food Insecurity: Not on file  Transportation Needs: Not on file  Physical Activity: Not on file  Stress: Not on file  Social Connections: Not on file  Intimate Partner Violence: Not on file    Family History  Problem Relation Age of Onset   Breast cancer Mother 39   Heart failure Father    Prostate cancer Father      Current Outpatient Medications:    amLODipine (NORVASC) 10 MG tablet, Take 10 mg by mouth at bedtime., Disp: , Rfl:    cyclobenzaprine (FLEXERIL) 10 MG tablet, Take 1 tablet by mouth 2 (two) times daily. , Disp: , Rfl:    diclofenac sodium (VOLTAREN) 1 % GEL, Apply 1 application topically 2 (two) times daily as needed (foot pain)., Disp: , Rfl:  docusate sodium (COLACE) 100 MG capsule, Take 300 mg by mouth at bedtime., Disp: , Rfl:    folic acid (FOLVITE) 1 MG tablet, Take 1 mg by mouth daily., Disp: , Rfl:    letrozole (FEMARA) 2.5 MG tablet, Take 1 tablet (2.5 mg total) by mouth daily., Disp: 90 tablet, Rfl: 3   metoprolol (TOPROL-XL) 200 MG 24 hr tablet, Take 100 mg by mouth daily., Disp: , Rfl:    aspirin-acetaminophen-caffeine (MIGRAINE RELIEF) 250-250-65 MG tablet, Take 1 tablet by mouth every 6 (six) hours as needed for headache. (Patient not taking: Reported on 07/02/2020), Disp: , Rfl:    methotrexate (RHEUMATREX) 2.5 MG tablet, TAKE 4 TABLETS (10 MG TOTAL) BY MOUTH ONCE A WEEK. CAUTION:CHEMOTHERAPY. PROTECT FROM LIGHT. (Patient not taking: Reported on 10/27/2022), Disp: 52 tablet, Rfl: 2   Multiple Vitamin (MULTIVITAMIN WITH MINERALS) TABS tablet, Take 1 tablet by mouth daily.  (Patient not taking: Reported on 06/19/2021), Disp: , Rfl:    nystatin (MYCOSTATIN/NYSTOP) powder, Apply 1 application topically 3 (three) times daily. (Patient not taking: Reported on 06/19/2021), Disp: 15 g, Rfl: 0   Omega-3  Fatty Acids (OMEGA 3 500) 500 MG CAPS, Take 500 mg by mouth daily. (Patient not taking: Reported on 06/19/2021), Disp: , Rfl:    omeprazole (PRILOSEC) 20 MG capsule, Take 20 mg by mouth daily.  (Patient not taking: Reported on 10/27/2022), Disp: , Rfl:    ondansetron (ZOFRAN) 4 MG tablet, Take 1 tablet (4 mg total) by mouth every 8 (eight) hours as needed for nausea or vomiting. (Patient not taking: Reported on 06/19/2021), Disp: 10 tablet, Rfl: 0   silver sulfADIAZINE (SILVADENE) 1 % cream, Apply 1 application topically daily as needed (skin irritation). (Patient not taking: Reported on 06/19/2021), Disp: , Rfl:    vitamin E 180 MG (400 UNITS) capsule, Take 800 Units by mouth daily. (Patient not taking: Reported on 06/19/2021), Disp: , Rfl:   Physical exam:  Vitals:   10/27/22 1033  BP: 119/70  Pulse: 73  Resp: 19  Temp: (!) 96.9 F (36.1 C)  SpO2: 99%  Weight: 208 lb 3.2 oz (94.4 kg)   Physical Exam Cardiovascular:     Rate and Rhythm: Normal rate and regular rhythm.     Heart sounds: Normal heart sounds.  Pulmonary:     Effort: Pulmonary effort is normal.     Breath sounds: Normal breath sounds.  Abdominal:     General: Bowel sounds are normal.     Palpations: Abdomen is soft.  Skin:    General: Skin is warm and dry.  Neurological:     Mental Status: She is alert and oriented to person, place, and time.    Breast exam was performed in seated and lying down position. Patient is status post right lumpectomy with a well-healed surgical scar. No evidence of any palpable masses. No evidence of axillary adenopathy. No evidence of any palpable masses or lumps in the left breast. No evidence of leftt axillary adenopathy      Latest Ref Rng & Units 10/30/2020   12:58 PM  CMP  Glucose 70 - 99 mg/dL 161   BUN 8 - 23 mg/dL 21   Creatinine 0.96 - 1.00 mg/dL 0.45   Sodium 409 - 811 mmol/L 138   Potassium 3.5 - 5.1 mmol/L 4.0   Chloride 98 - 111 mmol/L 104   CO2 22 - 32 mmol/L 29   Calcium  8.9 - 10.3 mg/dL 91.4   Total Protein 6.5 - 8.1  g/dL 7.4   Total Bilirubin 0.3 - 1.2 mg/dL 0.7   Alkaline Phos 38 - 126 U/L 134   AST 15 - 41 U/L 53   ALT 0 - 44 U/L 71       Latest Ref Rng & Units 10/30/2020   12:58 PM  CBC  WBC 4.0 - 10.5 K/uL 5.7   Hemoglobin 12.0 - 15.0 g/dL 16.1   Hematocrit 09.6 - 46.0 % 39.9   Platelets 150 - 400 K/uL 169      Assessment and plan- Patient is a 72 y.o. female with history of stage I right breast cancer in 2020 currently on letrozole here for routine follow-up  Clinically patient is doing well with no concernSigns and symptoms of recurrence based on today's exam.  She will continue with letrozole for 5 years ending next year.  She is due for a mammogram in December 2024 which I will schedule.  Patient states that she was found to have abnormal LFTs based on her recent blood work which was attribute it to one of her drugs that she was taking for sarcoidosis.  Letrozole can sometimes cause abnormal LFTs but it would be unusual for it to do that for years after taking the drug.  She is getting repeat LFTs done later this month and I will follow-up on those results.  I will see her back in 6 months no labs   Visit Diagnosis 1. Malignant neoplasm of upper-outer quadrant of right breast in female, estrogen receptor positive (HCC)   2. Use of letrozole (Femara)   3. Abnormal LFTs      Dr. Owens Shark, MD, MPH [facility] [phone] 10/27/2022 4:18 PM

## 2022-10-28 ENCOUNTER — Ambulatory Visit: Payer: Medicare HMO | Admitting: Oncology

## 2022-11-04 ENCOUNTER — Other Ambulatory Visit: Payer: Self-pay | Admitting: Pulmonary Disease

## 2022-11-04 DIAGNOSIS — J849 Interstitial pulmonary disease, unspecified: Secondary | ICD-10-CM

## 2022-12-01 ENCOUNTER — Ambulatory Visit
Admission: RE | Admit: 2022-12-01 | Discharge: 2022-12-01 | Disposition: A | Discharge status: Home / Self Care | Payer: Medicare HMO | Source: Ambulatory Visit | Attending: Pulmonary Disease | Admitting: Pulmonary Disease

## 2022-12-01 DIAGNOSIS — R918 Other nonspecific abnormal finding of lung field: Secondary | ICD-10-CM | POA: Diagnosis not present

## 2022-12-01 DIAGNOSIS — J849 Interstitial pulmonary disease, unspecified: Secondary | ICD-10-CM | POA: Insufficient documentation

## 2022-12-01 DIAGNOSIS — R161 Splenomegaly, not elsewhere classified: Secondary | ICD-10-CM | POA: Diagnosis not present

## 2022-12-01 DIAGNOSIS — C50919 Malignant neoplasm of unspecified site of unspecified female breast: Secondary | ICD-10-CM | POA: Diagnosis not present

## 2022-12-01 DIAGNOSIS — E042 Nontoxic multinodular goiter: Secondary | ICD-10-CM | POA: Diagnosis not present

## 2022-12-22 DIAGNOSIS — G4733 Obstructive sleep apnea (adult) (pediatric): Secondary | ICD-10-CM | POA: Diagnosis not present

## 2022-12-28 DIAGNOSIS — N1832 Chronic kidney disease, stage 3b: Secondary | ICD-10-CM | POA: Diagnosis not present

## 2023-01-21 DIAGNOSIS — G4733 Obstructive sleep apnea (adult) (pediatric): Secondary | ICD-10-CM | POA: Diagnosis not present

## 2023-01-28 DIAGNOSIS — H00011 Hordeolum externum right upper eyelid: Secondary | ICD-10-CM | POA: Diagnosis not present

## 2023-01-28 DIAGNOSIS — I129 Hypertensive chronic kidney disease with stage 1 through stage 4 chronic kidney disease, or unspecified chronic kidney disease: Secondary | ICD-10-CM | POA: Diagnosis not present

## 2023-01-28 DIAGNOSIS — H0289 Other specified disorders of eyelid: Secondary | ICD-10-CM | POA: Diagnosis not present

## 2023-01-28 DIAGNOSIS — H00012 Hordeolum externum right lower eyelid: Secondary | ICD-10-CM | POA: Diagnosis not present

## 2023-01-28 DIAGNOSIS — K760 Fatty (change of) liver, not elsewhere classified: Secondary | ICD-10-CM | POA: Diagnosis not present

## 2023-01-28 DIAGNOSIS — Z23 Encounter for immunization: Secondary | ICD-10-CM | POA: Diagnosis not present

## 2023-01-28 DIAGNOSIS — G4733 Obstructive sleep apnea (adult) (pediatric): Secondary | ICD-10-CM | POA: Diagnosis not present

## 2023-01-28 DIAGNOSIS — R300 Dysuria: Secondary | ICD-10-CM | POA: Diagnosis not present

## 2023-01-28 DIAGNOSIS — E7800 Pure hypercholesterolemia, unspecified: Secondary | ICD-10-CM | POA: Diagnosis not present

## 2023-01-28 DIAGNOSIS — H00015 Hordeolum externum left lower eyelid: Secondary | ICD-10-CM | POA: Diagnosis not present

## 2023-01-28 DIAGNOSIS — N1832 Chronic kidney disease, stage 3b: Secondary | ICD-10-CM | POA: Diagnosis not present

## 2023-01-28 DIAGNOSIS — H00014 Hordeolum externum left upper eyelid: Secondary | ICD-10-CM | POA: Diagnosis not present

## 2023-01-28 DIAGNOSIS — D869 Sarcoidosis, unspecified: Secondary | ICD-10-CM | POA: Diagnosis not present

## 2023-02-11 DIAGNOSIS — H00015 Hordeolum externum left lower eyelid: Secondary | ICD-10-CM | POA: Diagnosis not present

## 2023-02-11 DIAGNOSIS — H00012 Hordeolum externum right lower eyelid: Secondary | ICD-10-CM | POA: Diagnosis not present

## 2023-02-11 DIAGNOSIS — H00011 Hordeolum externum right upper eyelid: Secondary | ICD-10-CM | POA: Diagnosis not present

## 2023-02-11 DIAGNOSIS — H0289 Other specified disorders of eyelid: Secondary | ICD-10-CM | POA: Diagnosis not present

## 2023-02-11 DIAGNOSIS — H00014 Hordeolum externum left upper eyelid: Secondary | ICD-10-CM | POA: Diagnosis not present

## 2023-02-17 ENCOUNTER — Other Ambulatory Visit: Payer: Self-pay | Admitting: Oncology

## 2023-02-17 DIAGNOSIS — C50411 Malignant neoplasm of upper-outer quadrant of right female breast: Secondary | ICD-10-CM

## 2023-03-25 ENCOUNTER — Other Ambulatory Visit: Payer: Medicare HMO

## 2023-03-25 DIAGNOSIS — H43813 Vitreous degeneration, bilateral: Secondary | ICD-10-CM | POA: Diagnosis not present

## 2023-03-25 DIAGNOSIS — H2513 Age-related nuclear cataract, bilateral: Secondary | ICD-10-CM | POA: Diagnosis not present

## 2023-03-25 DIAGNOSIS — S0512XA Contusion of eyeball and orbital tissues, left eye, initial encounter: Secondary | ICD-10-CM | POA: Diagnosis not present

## 2023-03-25 DIAGNOSIS — H00015 Hordeolum externum left lower eyelid: Secondary | ICD-10-CM | POA: Diagnosis not present

## 2023-04-07 DIAGNOSIS — G4733 Obstructive sleep apnea (adult) (pediatric): Secondary | ICD-10-CM | POA: Diagnosis not present

## 2023-04-22 ENCOUNTER — Ambulatory Visit
Admission: RE | Admit: 2023-04-22 | Discharge: 2023-04-22 | Disposition: A | Discharge status: Home / Self Care | Payer: Medicare HMO | Source: Ambulatory Visit | Attending: Oncology | Admitting: Oncology

## 2023-04-22 ENCOUNTER — Ambulatory Visit: Payer: Medicare HMO

## 2023-04-22 ENCOUNTER — Other Ambulatory Visit: Payer: Medicare HMO

## 2023-04-22 ENCOUNTER — Inpatient Hospital Stay: Admission: RE | Admit: 2023-04-22 | Payer: Medicare HMO | Source: Ambulatory Visit

## 2023-04-22 DIAGNOSIS — M85851 Other specified disorders of bone density and structure, right thigh: Secondary | ICD-10-CM | POA: Diagnosis not present

## 2023-04-22 DIAGNOSIS — J849 Interstitial pulmonary disease, unspecified: Secondary | ICD-10-CM | POA: Diagnosis not present

## 2023-04-22 DIAGNOSIS — Z13820 Encounter for screening for osteoporosis: Secondary | ICD-10-CM | POA: Insufficient documentation

## 2023-04-22 DIAGNOSIS — Z79811 Long term (current) use of aromatase inhibitors: Secondary | ICD-10-CM

## 2023-04-22 DIAGNOSIS — C50411 Malignant neoplasm of upper-outer quadrant of right female breast: Secondary | ICD-10-CM

## 2023-04-22 DIAGNOSIS — M069 Rheumatoid arthritis, unspecified: Secondary | ICD-10-CM | POA: Insufficient documentation

## 2023-04-22 DIAGNOSIS — Z780 Asymptomatic menopausal state: Secondary | ICD-10-CM | POA: Insufficient documentation

## 2023-04-22 DIAGNOSIS — Z853 Personal history of malignant neoplasm of breast: Secondary | ICD-10-CM | POA: Diagnosis not present

## 2023-04-22 DIAGNOSIS — Z1231 Encounter for screening mammogram for malignant neoplasm of breast: Secondary | ICD-10-CM | POA: Insufficient documentation

## 2023-04-27 ENCOUNTER — Encounter: Payer: Self-pay | Admitting: Oncology

## 2023-04-27 ENCOUNTER — Inpatient Hospital Stay: Payer: Medicare HMO | Attending: Oncology | Admitting: Oncology

## 2023-04-27 ENCOUNTER — Inpatient Hospital Stay: Payer: Medicare HMO

## 2023-04-27 VITALS — BP 132/86 | HR 75 | Temp 98.8°F | Resp 18 | Wt 200.0 lb

## 2023-04-27 DIAGNOSIS — Z79899 Other long term (current) drug therapy: Secondary | ICD-10-CM | POA: Diagnosis not present

## 2023-04-27 DIAGNOSIS — Z79811 Long term (current) use of aromatase inhibitors: Secondary | ICD-10-CM | POA: Insufficient documentation

## 2023-04-27 DIAGNOSIS — M85851 Other specified disorders of bone density and structure, right thigh: Secondary | ICD-10-CM | POA: Insufficient documentation

## 2023-04-27 DIAGNOSIS — Z79631 Long term (current) use of antimetabolite agent: Secondary | ICD-10-CM | POA: Insufficient documentation

## 2023-04-27 DIAGNOSIS — Z08 Encounter for follow-up examination after completed treatment for malignant neoplasm: Secondary | ICD-10-CM

## 2023-04-27 DIAGNOSIS — C50911 Malignant neoplasm of unspecified site of right female breast: Secondary | ICD-10-CM | POA: Insufficient documentation

## 2023-04-27 DIAGNOSIS — Z923 Personal history of irradiation: Secondary | ICD-10-CM | POA: Insufficient documentation

## 2023-04-27 DIAGNOSIS — Z170 Estrogen receptor positive status [ER+]: Secondary | ICD-10-CM | POA: Diagnosis not present

## 2023-04-27 DIAGNOSIS — Z853 Personal history of malignant neoplasm of breast: Secondary | ICD-10-CM

## 2023-04-27 LAB — CMP (CANCER CENTER ONLY)
ALT: 31 U/L (ref 0–44)
AST: 37 U/L (ref 15–41)
Albumin: 3.8 g/dL (ref 3.5–5.0)
Alkaline Phosphatase: 131 U/L — ABNORMAL HIGH (ref 38–126)
Anion gap: 9 (ref 5–15)
BUN: 18 mg/dL (ref 8–23)
CO2: 26 mmol/L (ref 22–32)
Calcium: 9.8 mg/dL (ref 8.9–10.3)
Chloride: 106 mmol/L (ref 98–111)
Creatinine: 0.98 mg/dL (ref 0.44–1.00)
GFR, Estimated: >60 mL/min (ref >60)
Glucose, Bld: 154 mg/dL — ABNORMAL HIGH (ref 70–99)
Potassium: 3.6 mmol/L (ref 3.5–5.1)
Sodium: 141 mmol/L (ref 135–145)
Total Bilirubin: 1.6 mg/dL — ABNORMAL HIGH (ref <1.2)
Total Protein: 6.8 g/dL (ref 6.5–8.1)

## 2023-04-27 NOTE — Progress Notes (Signed)
Hematology/Oncology Consult note [facility]  [phone] [phone]  Patient Care Team: Gracelyn Nurse, MD as PCP - General (Internal Medicine) Shane Crutch, MD as Consulting Physician (Pulmonary Disease) Carolan Shiver, MD as Consulting Physician (General Surgery) Carmina Miller, MD as Referring Physician (Radiation Oncology) Scarlett Presto, RN (Inactive) as Oncology Nurse Navigator Hatton, Boykin Nearing, MD as Consulting Physician (Gastroenterology) Abe People as Physician Assistant Vida Rigger, MD as Consulting Physician (Pulmonary Disease) Creig Hines, MD as Consulting Physician (Oncology)   Name of the patient: Jill Bond  [id number]  1950-06-08   Date of visit: 04/27/23  Diagnosis- history of stage Ia right breast cancer ER/PR positive HER2 negative   Chief complaint/ Reason for visit-routine follow-up of breast cancer on letrozole  Heme/Onc history: Patient is a 72 year old female diagnosed with stage Ia right breast cancer in September 2020. 1.2 cm grade 1 invasive mammary carcinoma with low-grade DCIS. 1 out of 3 sentinel lymph nodes positive for micrometastatic carcinoma. Tumor greater than 90% ER positive, PR 51 to 90% positive and HER2 negative. Oncotype score was 16 and she did not require any adjuvant chemotherapy. Patient also has a history of sarcoidosis biopsy-proven on supraclavicular lymph node biopsy. Mild hypercalcemia secondary to that. History of multiple angiomyolipoma s/p left nephrectomy in 2007. Baseline bone density scan normal. She began Femara in February 2021. Also in July 2021 there was some enhancement noted at the base of the right nipple. She had an excisional biopsy which showed focal duct ectasia and usual ductal hyperplasia but no evidence of malignancy     Interval history-overall tolerating letrozole well without any significant side effects.  Occasional pain in  the inferior portion of her right breast..  She is not on calcium and vitamin D due to her history of sarcoidosis and mild chronic hypercalcemia.  ECOG PS- 1 Pain scale- 0   Review of systems- Review of Systems  Constitutional:  Negative for chills, fever, malaise/fatigue and weight loss.  HENT:  Negative for congestion, ear discharge and nosebleeds.   Eyes:  Negative for blurred vision.  Respiratory:  Negative for cough, hemoptysis, sputum production, shortness of breath and wheezing.   Cardiovascular:  Negative for chest pain, palpitations, orthopnea and claudication.  Gastrointestinal:  Negative for abdominal pain, blood in stool, constipation, diarrhea, heartburn, melena, nausea and vomiting.  Genitourinary:  Negative for dysuria, flank pain, frequency, hematuria and urgency.  Musculoskeletal:  Negative for back pain, joint pain and myalgias.  Skin:  Negative for rash.  Neurological:  Negative for dizziness, tingling, focal weakness, seizures, weakness and headaches.  Endo/Heme/Allergies:  Does not bruise/bleed easily.  Psychiatric/Behavioral:  Negative for depression and suicidal ideas. The patient does not have insomnia.       Allergies  Allergen Reactions   Codeine Nausea And Vomiting   Penicillins Hives and Other (See Comments)    Has patient had a PCN reaction causing immediate rash, facial/tongue/throat swelling, SOB or lightheadedness with hypotension: no Has patient had a PCN reaction causing severe rash involving mucus membranes or skin necrosis: no Has patient had a PCN reaction that required hospitalization no Has patient had a PCN reaction occurring within the last 10 years: no If all of the above answers are "NO", then may proceed with Cephalosporin use.      Past Medical History:  Diagnosis Date   Anxiety    Arthritis    Breast cancer (HCC) 01/2019   invasive mammary carcinoma  Chronic kidney disease 2006   Left Nephrectomy   Complication of anesthesia     PONV   Constipation    Depression    GERD (gastroesophageal reflux disease)    Headache    migraines   Hypertension    Personal history of radiation therapy    PONV (postoperative nausea and vomiting)    Rotator cuff disorder    LEFT   Sarcoidosis    Shortness of breath dyspnea    with exertion   Sleep apnea    Trigger finger, right      Past Surgical History:  Procedure Laterality Date   ABDOMINAL HYSTERECTOMY     PARTIAL   ACHILLES TENDON SURGERY Left 02/22/2015   Procedure: Secondary ACHILLES TENDON REPAIR;  Surgeon: Recardo Evangelist, DPM;  Location: [facility] OR[facility];  Service: Podiatry;  Laterality: Left;   Bladder tac     BREAST BIOPSY Right 01/23/2019   affirm stereo bx of calcs with x marker, ductal hyperplasia   BREAST BIOPSY Right 01/23/2019   [place] bx of mass with coil marker, invasive mammary carcinoma/DCIS   BREAST EXCISIONAL BIOPSY Left 2003   neg   BREAST LUMPECTOMY Right 02/06/2019   IMC/DCIS clear margins, ONE LYMPH NODE WITH 1.2 MM FOCUS OF MICRO METASTATIC CARCINOMA   COLONOSCOPY  2013   COLONOSCOPY WITH PROPOFOL N/A 03/02/2022   Procedure: COLONOSCOPY WITH PROPOFOL;  Surgeon: Regis Bill, MD;  Location: [facility];  Service: Endoscopy;  Laterality: N/A;   EXCISION / BIOPSY BREAST / NIPPLE / DUCT Right 01/19/2020   duct excision due to nipple d/c, Usual ductal hyperplasia, patchy stromal fibrosis. No atypia or malignancy   EXCISION OF BREAST BIOPSY Right 01/19/2020   Procedure: EXCISION OF BREAST BIOPSY;  Surgeon: Carolan Shiver, MD;  Location: [facility];  Service: General;  Laterality: Right;   JOINT REPLACEMENT Left 2007   Total Knee Replacement   KIDNEY CYST REMOVAL Left    NEPHRECTOMY Left    OSTECTOMY Left 02/22/2015   Procedure: OSTECTOMY;  Surgeon: Recardo Evangelist, DPM;  Location: [facility];  Service: Podiatry;  Laterality: Left;   PARTIAL MASTECTOMY WITH NEEDLE LOCALIZATION AND AXILLARY SENTINEL LYMPH NODE BX Right 02/06/2019    Procedure: PARTIAL MASTECTOMY WITH NEEDLE LOCALIZATION AND AXILLARY SENTINEL LYMPH NODE BX;  Surgeon: Carolan Shiver, MD;  Location: [facility];  Service: General;  Laterality: Right;   SHOULDER ARTHROSCOPY W/ ROTATOR CUFF REPAIR Left YRS AGO   TONSILLECTOMY  AGE 69   TOTAL KNEE ARTHROPLASTY Right 10/22/2015   Procedure: RIGHT TOTAL KNEE ARTHROPLASTY;  Surgeon: Durene Romans, MD;  Location: [facility];  Service: Orthopedics;  Laterality: Right;   TOTAL KNEE ARTHROPLASTY Right    10/22/2015    Social History   Socioeconomic History   Marital status: Widowed    Spouse name: Not on file   Number of children: Not on file   Years of education: Not on file   Highest education level: Not on file  Occupational History   Not on file  Tobacco Use   Smoking status: Never   Smokeless tobacco: Never  Vaping Use   Vaping status: Never Used  Substance and Sexual Activity   Alcohol use: No   Drug use: No   Sexual activity: Not on file  Other Topics Concern   Not on file  Social History Narrative   Not on file   Social Drivers of Health   Financial Resource Strain: Low Risk  (07/07/2022)   Received from [facility] [place]  [facility]   Overall Financial Resource Strain (CARDIA)    Difficulty of Paying Living Expenses: Not hard at all  Food Insecurity: No Food Insecurity (07/07/2022)   Received from [facility], [facility]   Hunger Vital Sign    Worried About Running Out of Food in the Last Year: Never true    Ran Out of Food in the Last Year: Never true  Transportation Needs: No Transportation Needs (07/07/2022)   Received from [facility], [facility]   [facility] - Transportation    In the past 12 months, has lack of transportation kept you from medical appointments or from getting medications?: No    Lack of Transportation (Non-Medical): No  Physical Activity: Not on file  Stress: Not on  file  Social Connections: Not on file  Intimate Partner Violence: Not on file    Family History  Problem Relation Age of Onset   Breast cancer Mother 89   Heart failure Father    Prostate cancer Father      Current Outpatient Medications:    amLODipine (NORVASC) 10 MG tablet, Take 10 mg by mouth at bedtime., Disp: , Rfl:    aspirin-acetaminophen-caffeine (MIGRAINE RELIEF) 250-250-65 MG tablet, Take 1 tablet by mouth every 6 (six) hours as needed for headache. (Patient not taking: Reported on 07/02/2020), Disp: , Rfl:    cyclobenzaprine (FLEXERIL) 10 MG tablet, Take 1 tablet by mouth 2 (two) times daily. , Disp: , Rfl:    diclofenac sodium (VOLTAREN) 1 % GEL, Apply 1 application topically 2 (two) times daily as needed (foot pain)., Disp: , Rfl:    docusate sodium (COLACE) 100 MG capsule, Take 300 mg by mouth at bedtime., Disp: , Rfl:    folic acid (FOLVITE) 1 MG tablet, Take 1 mg by mouth daily., Disp: , Rfl:    letrozole (FEMARA) 2.5 MG tablet, TAKE 1 TABLET EVERY DAY, Disp: 90 tablet, Rfl: 3   methotrexate (RHEUMATREX) 2.5 MG tablet, TAKE 4 TABLETS (10 MG TOTAL) BY MOUTH ONCE A WEEK. CAUTION:CHEMOTHERAPY. PROTECT FROM LIGHT. (Patient not taking: Reported on 10/27/2022), Disp: 52 tablet, Rfl: 2   metoprolol (TOPROL-XL) 200 MG 24 hr tablet, Take 100 mg by mouth daily., Disp: , Rfl:    Multiple Vitamin (MULTIVITAMIN WITH MINERALS) TABS tablet, Take 1 tablet by mouth daily.  (Patient not taking: Reported on 06/19/2021), Disp: , Rfl:    nystatin (MYCOSTATIN/NYSTOP) powder, Apply 1 application topically 3 (three) times daily. (Patient not taking: Reported on 06/19/2021), Disp: 15 g, Rfl: 0   Omega-3 Fatty Acids (OMEGA 3 500) 500 MG CAPS, Take 500 mg by mouth daily. (Patient not taking: Reported on 06/19/2021), Disp: , Rfl:    omeprazole (PRILOSEC) 20 MG capsule, Take 20 mg by mouth daily.  (Patient not taking: Reported on 10/27/2022), Disp: , Rfl:    ondansetron (ZOFRAN) 4 MG tablet, Take 1 tablet (4 mg  total) by mouth every 8 (eight) hours as needed for nausea or vomiting. (Patient not taking: Reported on 06/19/2021), Disp: 10 tablet, Rfl: 0   silver sulfADIAZINE (SILVADENE) 1 % cream, Apply 1 application topically daily as needed (skin irritation). (Patient not taking: Reported on 06/19/2021), Disp: , Rfl:    vitamin E 180 MG (400 UNITS) capsule, Take 800 Units by mouth daily. (Patient not taking: Reported on 06/19/2021), Disp: , Rfl:   Physical exam:  Vitals:   04/27/23 1029  BP: 132/86  Pulse: 75  Resp: 18  Temp:  98.8 F (37.1 C)  TempSrc: Tympanic  SpO2: 100%  Weight: 200 lb (90.7 kg)   Physical Exam Cardiovascular:     Rate and Rhythm: Normal rate and regular rhythm.     Heart sounds: Normal heart sounds.  Pulmonary:     Effort: Pulmonary effort is normal.  Skin:    General: Skin is warm and dry.  Neurological:     Mental Status: She is alert and oriented to person, place, and time.    Breast exam was performed in seated and lying down position. Patient is status post right lumpectomy with a well-healed surgical scar. No evidence of any palpable masses. No evidence of axillary adenopathy. No evidence of any palpable masses or lumps in the left breast. No evidence of leftt axillary adenopathy      Latest Ref Rng & Units 04/27/2023   11:04 AM  CMP  Glucose 70 - 99 mg/dL 981   BUN 8 - 23 mg/dL 18   Creatinine 1.91 - 1.00 mg/dL 4.78   Sodium 295 - 621 mmol/L 141   Potassium 3.5 - 5.1 mmol/L 3.6   Chloride 98 - 111 mmol/L 106   CO2 22 - 32 mmol/L 26   Calcium 8.9 - 10.3 mg/dL 9.8   Total Protein 6.5 - 8.1 g/dL 6.8   Total Bilirubin <3.0 mg/dL 1.6   Alkaline Phos 38 - 126 U/L 131   AST 15 - 41 U/L 37   ALT 0 - 44 U/L 31       Latest Ref Rng & Units 10/30/2020   12:58 PM  CBC  WBC 4.0 - 10.5 K/uL 5.7   Hemoglobin 12.0 - 15.0 g/dL 86.5   Hematocrit 78.4 - 46.0 % 39.9   Platelets 150 - 400 K/uL 169     No images are attached to the encounter.  MM 3D SCREENING  MAMMOGRAM BILATERAL BREAST Result Date: 04/23/2023 CLINICAL DATA:  Screening. EXAM: DIGITAL SCREENING BILATERAL MAMMOGRAM WITH TOMOSYNTHESIS AND CAD TECHNIQUE: Bilateral screening digital craniocaudal and mediolateral oblique mammograms were obtained. Bilateral screening digital breast tomosynthesis was performed. The images were evaluated with computer-aided detection. COMPARISON:  Previous exam(s). ACR Breast Density Category b: There are scattered areas of fibroglandular density. FINDINGS: There are no findings suspicious for malignancy. IMPRESSION: No mammographic evidence of malignancy. A result letter of this screening mammogram will be mailed directly to the patient. RECOMMENDATION: Screening mammogram in one year. (Code:SM-B-01Y) BI-RADS CATEGORY  1: Negative. Electronically Signed   By: Frederico Hamman M.D.   On: 04/23/2023 16:07   DG Bone Density Result Date: 04/22/2023 EXAM: DUAL X-RAY ABSORPTIOMETRY (DXA) FOR BONE MINERAL DENSITY IMPRESSION: Your patient Kaedynce Hoe completed a BMD test on 04/22/2023 using the [employer] iDXA DXA System (software version: 14.10) manufactured by [employer]. The following summarizes the results of our evaluation. Technologist:VLM PATIENT BIOGRAPHICAL: Name: Emmaly, Lockwood Patient ID: [id number] Birth Date: Nov 08, 1950 Height: 64.0 in. Gender: Female Exam Date: 04/22/2023 Weight: 208.2 lbs. Indications: History of kidney disease, Bilateral Knee Replacements, Breast CA, Sarcoidosis, Caucasian, Hysterectomy, Postmenopausal, Rheumatoid Arthritis Fractures: Treatments: Gabapentin, Methotrexate, Multi-Vitamin, Omeprazole DENSITOMETRY RESULTS: Site      Region     Measured Date Measured Age WHO Classification Young Adult T-score BMD         %Change vs. Previous Significant Change (*) AP Spine L1-L2 5/22/2024 71.8 Normal -0.3 1.137 g/cm2 -1.7% - AP Spine L1-L2 04/17/2021 69.8 Normal -0.1 1.157 g/cm2 -3.5% - AP Spine L1-L2 3/22/2019 67.6 Normal 0.2 1.199  g/cm2 - - DualFemur Neck Right 04/22/2023 71.8 Osteopenia -1.2 0.872 g/cm2 -3.0% - DualFemur Neck Right 04/17/2021 69.8 Normal -1.0 0.899 g/cm2 -7.3% Yes DualFemur Neck Right 02/22/2019 67.6 Normal -0.5 0.970 g/cm2 - - DualFemur Total Mean 04/22/2023 71.8 Normal -0.8 0.912 g/cm2 -4.5% Yes DualFemur Total Mean 04/17/2021 69.8 Normal -0.4 0.955 g/cm2 -8.0% Yes DualFemur Total Mean 02/22/2019 67.6 Normal 0.2 1.038 g/cm2 - - ASSESSMENT: The BMD measured at Femur Neck Right is 0.872 g/cm2 with a T-score of -1.2. This patient's diagnostic category is LOW BONE MASS/OSTEOPENIA according to World Health Organization [facility]) criteria. L- 3 & L-4 were excluded due to degenerative changes. Comparison to 04/17/2021. Since the prior study, there has been a SIGNIFICANT DECREASE in bone mineral density of the hips (-4.5%). Since the prior study, there has been NO SIGNIFICANT CHANGE in bone mineral density of the lumbar spine. World Science writer [facility]) criteria for post-menopausal, Caucasian Women: Normal:                   T-score at or above -1 SD Osteopenia/low bone mass: T-score between -1 and -2.5 SD Osteoporosis:             T-score at or below -2.5 SD RECOMMENDATIONS: 1. All patients should optimize calcium and vitamin D intake. 2. Consider FDA-approved medical therapies in postmenopausal women and men aged 32 years and older, based on the following: a. A hip or vertebral(clinical or morphometric) fracture b. T-score < -2.5 at the femoral neck or spine after appropriate evaluation to exclude secondary causes c. Low bone mass (T-score between -1.0 and -2.5 at the femoral neck or spine) and a 10-year probability of a hip fracture > 3% or a 10-year probability of a major osteoporosis-related fracture > 20% based on the [place]-adapted WHO algorithm 3. Clinician judgment and/or patient preferences may indicate treatment for people with 10-year fracture probabilities above or below these levels FOLLOW-UP: People with diagnosed  cases of osteoporosis or at high risk for fracture should have regular bone mineral density tests. For patients eligible for Medicare, routine testing is allowed once every 2 years. The testing frequency can be increased to one year for patients who have rapidly progressing disease, those who are receiving or discontinuing medical therapy to restore bone mass, or have additional risk factors. I have reviewed this report, and agree with the above findings. [facility] Dear Pete Glatter Oleg Oleson, Your patient LILYGRACE PFINGSTON completed a FRAX assessment on 04/22/2023 using the [facility] iDXA DXA System (analysis version: 14.10) manufactured by [employer]. The following summarizes the results of our evaluation. PATIENT BIOGRAPHICAL: Name: Sharesse, Grout Patient ID: [id number] Birth Date: 1950/07/30 Height:    64.0 in. Gender:     [age]    [age]       Weight:    208.2 lbs. Ethnicity:  White                            Exam Date: 04/22/2023 FRAX* RESULTS:  (version: 3.5) 10-year Probability of Fracture1 Major Osteoporotic Fracture2 Hip Fracture 11.4% 1.6% Population: [place] (Caucasian) Risk Factors: Rheumatoid Arthritis Based on Femur (Right) Neck BMD 1 -The 10-year probability of fracture may be lower than reported if the patient has received treatment. 2 -Major Osteoporotic Fracture: Clinical Spine, Forearm, Hip or Shoulder *FRAX is a Armed forces logistics/support/administrative officer of the [employer] for Metabolic Bone Disease, a World  Health Organization [facility]) Collaborating Centre. ASSESSMENT: The probability of a major osteoporotic fracture is 11.4% within the next ten years. The probability of a hip fracture is 1.6% within the next ten years. . Electronically Signed   By: Harmon Pier M.D.   On: 04/22/2023 11:17     Assessment and plan- Patient is a 72 y.o. female with history of stage I ER/PR positive HER2 negative right breast cancer status postlumpectomy and adjuvant radiation therapy currently  on letrozole here for a routine follow-up  Clinically patient is doing well with no concerning signs and symptoms of recurrence based On today's exam.  She will be completing 5 years of letrozole treatment in February 2026.  Her bone density scan in December 2024 showed osteopenia involving the right hip but her 10-year probability of a major osteoporotic fracture was less than 20% and hip fracture less than 3%.  She does not require any adjuvant bisphosphonates at this time.   Visit Diagnosis 1. Encounter for follow-up surveillance of breast cancer   2. Use of letrozole (Femara)   3. High risk medication use   4. Osteopenia of right hip      Dr. Owens Shark, MD, MPH [facility] [phone] 04/27/2023 4:41 PM

## 2023-04-28 DIAGNOSIS — H2513 Age-related nuclear cataract, bilateral: Secondary | ICD-10-CM | POA: Diagnosis not present

## 2023-04-28 DIAGNOSIS — H00015 Hordeolum externum left lower eyelid: Secondary | ICD-10-CM | POA: Diagnosis not present

## 2023-04-28 DIAGNOSIS — H0289 Other specified disorders of eyelid: Secondary | ICD-10-CM | POA: Diagnosis not present

## 2023-04-29 DIAGNOSIS — Z170 Estrogen receptor positive status [ER+]: Secondary | ICD-10-CM | POA: Diagnosis not present

## 2023-04-29 DIAGNOSIS — C50411 Malignant neoplasm of upper-outer quadrant of right female breast: Secondary | ICD-10-CM | POA: Diagnosis not present

## 2023-05-03 ENCOUNTER — Ambulatory Visit: Payer: Medicare HMO | Admitting: Oncology

## 2023-07-27 DIAGNOSIS — I10 Essential (primary) hypertension: Secondary | ICD-10-CM | POA: Diagnosis not present

## 2023-08-03 DIAGNOSIS — I890 Lymphedema, not elsewhere classified: Secondary | ICD-10-CM | POA: Diagnosis not present

## 2023-08-03 DIAGNOSIS — I129 Hypertensive chronic kidney disease with stage 1 through stage 4 chronic kidney disease, or unspecified chronic kidney disease: Secondary | ICD-10-CM | POA: Diagnosis not present

## 2023-08-03 DIAGNOSIS — E7800 Pure hypercholesterolemia, unspecified: Secondary | ICD-10-CM | POA: Diagnosis not present

## 2023-08-03 DIAGNOSIS — G4733 Obstructive sleep apnea (adult) (pediatric): Secondary | ICD-10-CM | POA: Diagnosis not present

## 2023-08-03 DIAGNOSIS — N1832 Chronic kidney disease, stage 3b: Secondary | ICD-10-CM | POA: Diagnosis not present

## 2023-08-03 DIAGNOSIS — Z1331 Encounter for screening for depression: Secondary | ICD-10-CM | POA: Diagnosis not present

## 2023-08-03 DIAGNOSIS — Z0001 Encounter for general adult medical examination with abnormal findings: Secondary | ICD-10-CM | POA: Diagnosis not present

## 2023-08-03 DIAGNOSIS — Z0000 Encounter for general adult medical examination without abnormal findings: Secondary | ICD-10-CM | POA: Diagnosis not present

## 2023-08-03 DIAGNOSIS — K760 Fatty (change of) liver, not elsewhere classified: Secondary | ICD-10-CM | POA: Diagnosis not present

## 2023-08-06 DIAGNOSIS — S63502A Unspecified sprain of left wrist, initial encounter: Secondary | ICD-10-CM | POA: Diagnosis not present

## 2023-08-17 DIAGNOSIS — S63502A Unspecified sprain of left wrist, initial encounter: Secondary | ICD-10-CM | POA: Diagnosis not present

## 2023-08-25 DIAGNOSIS — B880 Other acariasis: Secondary | ICD-10-CM | POA: Diagnosis not present

## 2023-08-25 DIAGNOSIS — H01009 Unspecified blepharitis unspecified eye, unspecified eyelid: Secondary | ICD-10-CM | POA: Diagnosis not present

## 2023-08-25 DIAGNOSIS — H02053 Trichiasis without entropian right eye, unspecified eyelid: Secondary | ICD-10-CM | POA: Diagnosis not present

## 2023-09-15 DIAGNOSIS — H01009 Unspecified blepharitis unspecified eye, unspecified eyelid: Secondary | ICD-10-CM | POA: Diagnosis not present

## 2023-09-15 DIAGNOSIS — B880 Other acariasis: Secondary | ICD-10-CM | POA: Diagnosis not present

## 2023-10-27 ENCOUNTER — Encounter: Payer: Self-pay | Admitting: Oncology

## 2023-10-27 ENCOUNTER — Inpatient Hospital Stay: Payer: Medicare HMO | Attending: Oncology | Admitting: Oncology

## 2023-10-27 DIAGNOSIS — Z853 Personal history of malignant neoplasm of breast: Secondary | ICD-10-CM

## 2023-10-27 DIAGNOSIS — Z79811 Long term (current) use of aromatase inhibitors: Secondary | ICD-10-CM | POA: Insufficient documentation

## 2023-10-27 DIAGNOSIS — C50911 Malignant neoplasm of unspecified site of right female breast: Secondary | ICD-10-CM | POA: Insufficient documentation

## 2023-10-27 DIAGNOSIS — D869 Sarcoidosis, unspecified: Secondary | ICD-10-CM | POA: Diagnosis not present

## 2023-10-27 DIAGNOSIS — Z170 Estrogen receptor positive status [ER+]: Secondary | ICD-10-CM | POA: Insufficient documentation

## 2023-10-27 DIAGNOSIS — Z905 Acquired absence of kidney: Secondary | ICD-10-CM | POA: Diagnosis not present

## 2023-10-27 DIAGNOSIS — Z08 Encounter for follow-up examination after completed treatment for malignant neoplasm: Secondary | ICD-10-CM

## 2023-10-27 DIAGNOSIS — Z803 Family history of malignant neoplasm of breast: Secondary | ICD-10-CM | POA: Insufficient documentation

## 2023-10-27 NOTE — Progress Notes (Signed)
 Hematology/Oncology Consult note [facility]  [phone] [phone]  Patient Care Team: Little Riff, MD as PCP - General (Internal Medicine) Enos Harts, MD as Consulting Physician (Pulmonary Disease) Eldred Grego, MD as Consulting Physician (General Surgery) Glenis Langdon, MD as Referring Physician (Radiation Oncology) Arlette Benders, RN (Inactive) as Oncology Nurse Navigator Alburnett, Alphonsus Jeans, MD as Consulting Physician (Gastroenterology) Cristino Donna as Physician Assistant Erskin Hearing, MD as Consulting Physician (Pulmonary Disease) Avonne Boettcher, MD as Consulting Physician (Oncology)   Name of the patient: Jill Bond  [id number]  May 16, 1950   Date of visit: 10/27/23  Diagnosis-  history of stage Ia right breast cancer ER/PR positive HER2 negative   Chief complaint/ Reason for visit-routine surveillance visit for breast cancer  Heme/Onc history: Patient is a 73 year old female diagnosed with stage Ia right breast cancer in September 2020. 1.2 cm grade 1 invasive mammary carcinoma with low-grade DCIS. 1 out of 3 sentinel lymph nodes positive for micrometastatic carcinoma. Tumor greater than 90% ER positive, PR 51 to 90% positive and HER2 negative. Oncotype score was 16 and she did not require any adjuvant chemotherapy. Patient also has a history of sarcoidosis biopsy-proven on supraclavicular lymph node biopsy. Mild hypercalcemia secondary to that. History of multiple angiomyolipoma s/p left nephrectomy in 2007. Baseline bone density scan normal. She began Femara  in February 2021. Also in July 2021 there was some enhancement noted at the base of the right nipple. She had an excisional biopsy which showed focal duct ectasia and usual ductal hyperplasia but no evidence of malignancy       Interval history-she denies any breast concerns at this time.  Denies any new aches and pains anywhere.   Tolerating letrozole  well without any significant side effects  ECOG PS- 1 Pain scale- 0   Review of systems- Review of Systems  Constitutional:  Negative for chills, fever, malaise/fatigue and weight loss.  HENT:  Negative for congestion, ear discharge and nosebleeds.   Eyes:  Negative for blurred vision.  Respiratory:  Negative for cough, hemoptysis, sputum production, shortness of breath and wheezing.   Cardiovascular:  Negative for chest pain, palpitations, orthopnea and claudication.  Gastrointestinal:  Negative for abdominal pain, blood in stool, constipation, diarrhea, heartburn, melena, nausea and vomiting.  Genitourinary:  Negative for dysuria, flank pain, frequency, hematuria and urgency.  Musculoskeletal:  Negative for back pain, joint pain and myalgias.  Skin:  Negative for rash.  Neurological:  Negative for dizziness, tingling, focal weakness, seizures, weakness and headaches.  Endo/Heme/Allergies:  Does not bruise/bleed easily.  Psychiatric/Behavioral:  Negative for depression and suicidal ideas. The patient does not have insomnia.       Allergies  Allergen Reactions   Codeine Nausea And Vomiting   Penicillins Hives and Other (See Comments)    Has patient had a PCN reaction causing immediate rash, facial/tongue/throat swelling, SOB or lightheadedness with hypotension: no Has patient had a PCN reaction causing severe rash involving mucus membranes or skin necrosis: no Has patient had a PCN reaction that required hospitalization no Has patient had a PCN reaction occurring within the last 10 years: no If all of the above answers are NO, then may proceed with Cephalosporin use.      Past Medical History:  Diagnosis Date   Anxiety    Arthritis    Breast cancer (HCC) 01/2019   invasive mammary carcinoma    Chronic kidney disease 2006   Left Nephrectomy   Complication  of anesthesia    PONV   Constipation    Depression    GERD (gastroesophageal reflux disease)     Headache    migraines   Hypertension    Personal history of radiation therapy    PONV (postoperative nausea and vomiting)    Rotator cuff disorder    LEFT   Sarcoidosis    Shortness of breath dyspnea    with exertion   Sleep apnea    Trigger finger, right      Past Surgical History:  Procedure Laterality Date   ABDOMINAL HYSTERECTOMY     PARTIAL   ACHILLES TENDON SURGERY Left 02/22/2015   Procedure: Secondary ACHILLES TENDON REPAIR;  Surgeon: Sharlyn Deaner, DPM;  Location: [facility];  Service: Podiatry;  Laterality: Left;   Bladder tac     BREAST BIOPSY Right 01/23/2019   affirm stereo bx of calcs with x marker, ductal hyperplasia   BREAST BIOPSY Right 01/23/2019   us  bx of mass with coil marker, invasive mammary carcinoma/DCIS   BREAST EXCISIONAL BIOPSY Left 2003   neg   BREAST LUMPECTOMY Right 02/06/2019   IMC/DCIS clear margins, ONE LYMPH NODE WITH 1.2 MM FOCUS OF MICRO METASTATIC CARCINOMA   COLONOSCOPY  2013   COLONOSCOPY WITH PROPOFOL  N/A 03/02/2022   Procedure: COLONOSCOPY WITH PROPOFOL ;  Surgeon: Shane Darling, MD;  Location: [facility];  Service: Endoscopy;  Laterality: N/A;   EXCISION / BIOPSY BREAST / NIPPLE / DUCT Right 01/19/2020   duct excision due to nipple d/c, Usual ductal hyperplasia, patchy stromal fibrosis. No atypia or malignancy   EXCISION OF BREAST BIOPSY Right 01/19/2020   Procedure: EXCISION OF BREAST BIOPSY;  Surgeon: Eldred Grego, MD;  Location: [facility];  Service: General;  Laterality: Right;   JOINT REPLACEMENT Left 2007   Total Knee Replacement   KIDNEY CYST REMOVAL Left    NEPHRECTOMY Left    OSTECTOMY Left 02/22/2015   Procedure: OSTECTOMY;  Surgeon: Sharlyn Deaner, DPM;  Location: [facility];  Service: Podiatry;  Laterality: Left;   PARTIAL MASTECTOMY WITH NEEDLE LOCALIZATION AND AXILLARY SENTINEL LYMPH NODE BX Right 02/06/2019   Procedure: PARTIAL MASTECTOMY WITH NEEDLE LOCALIZATION AND AXILLARY SENTINEL LYMPH NODE BX;   Surgeon: Eldred Grego, MD;  Location: [facility];  Service: General;  Laterality: Right;   SHOULDER ARTHROSCOPY W/ ROTATOR CUFF REPAIR Left YRS AGO   TONSILLECTOMY  AGE 70   TOTAL KNEE ARTHROPLASTY Right 10/22/2015   Procedure: RIGHT TOTAL KNEE ARTHROPLASTY;  Surgeon: Claiborne Crew, MD;  Location: [facility];  Service: Orthopedics;  Laterality: Right;   TOTAL KNEE ARTHROPLASTY Right    10/22/2015    Social History   Socioeconomic History   Marital status: Widowed    Spouse name: Not on file   Number of children: Not on file   Years of education: Not on file   Highest education level: Not on file  Occupational History   Not on file  Tobacco Use   Smoking status: Never   Smokeless tobacco: Never  Vaping Use   Vaping status: Never Used  Substance and Sexual Activity   Alcohol  use: No   Drug use: No   Sexual activity: Not on file  Other Topics Concern   Not on file  Social History Narrative   Not on file   Social Drivers of Health   Financial Resource Strain: Low Risk  (08/03/2023)   [facility]   Overall Financial Resource Strain (CARDIA)    Difficulty of  Paying Living Expenses: Not hard at all  Food Insecurity: No Food Insecurity (08/03/2023)   Received from [facility]   Hunger Vital Sign    Within the past 12 months, you worried that your food would run out before you got the money to buy more.: Never true    Within the past 12 months, the food you bought just didn't last and you didn't have money to get more.: Never true  Transportation Needs: No Transportation Needs (08/03/2023)   Received from [facility] - Transportation    In the past 12 months, has lack of transportation kept you from medical appointments or from getting medications?: No    Lack of Transportation (Non-Medical): No  Physical Activity: Not on file  Stress: Not on file  Social Connections: Not on file  Intimate Partner  Violence: Not on file    Family History  Problem Relation Age of Onset   Breast cancer Mother 43   Heart failure Father    Prostate cancer Father      Current Outpatient Medications:    amLODipine  (NORVASC ) 10 MG tablet, Take 10 mg by mouth at bedtime., Disp: , Rfl:    aspirin -acetaminophen -caffeine (MIGRAINE RELIEF) 250-250-65 MG tablet, Take 1 tablet by mouth every 6 (six) hours as needed for headache., Disp: , Rfl:    cyclobenzaprine (FLEXERIL) 10 MG tablet, Take 1 tablet by mouth 2 (two) times daily. , Disp: , Rfl:    diclofenac sodium (VOLTAREN) 1 % GEL, Apply 1 application topically 2 (two) times daily as needed (foot pain)., Disp: , Rfl:    docusate sodium  (COLACE) 100 MG capsule, Take 300 mg by mouth at bedtime., Disp: , Rfl:    folic acid  (FOLVITE ) 1 MG tablet, Take 1 mg by mouth daily., Disp: , Rfl:    letrozole  (FEMARA ) 2.5 MG tablet, TAKE 1 TABLET EVERY DAY, Disp: 90 tablet, Rfl: 3   metoprolol  (TOPROL -XL) 200 MG 24 hr tablet, Take 100 mg by mouth daily., Disp: , Rfl:    methotrexate  (RHEUMATREX) 2.5 MG tablet, TAKE 4 TABLETS (10 MG TOTAL) BY MOUTH ONCE A WEEK. CAUTION:CHEMOTHERAPY. PROTECT FROM LIGHT. (Patient not taking: Reported on 10/27/2022), Disp: 52 tablet, Rfl: 2   Multiple Vitamin (MULTIVITAMIN WITH MINERALS) TABS tablet, Take 1 tablet by mouth daily.  (Patient not taking: Reported on 06/19/2021), Disp: , Rfl:    nystatin  (MYCOSTATIN /NYSTOP ) powder, Apply 1 application topically 3 (three) times daily. (Patient not taking: Reported on 06/19/2021), Disp: 15 g, Rfl: 0   Omega-3 Fatty Acids (OMEGA 3 500) 500 MG CAPS, Take 500 mg by mouth daily. (Patient not taking: Reported on 06/19/2021), Disp: , Rfl:    omeprazole  (PRILOSEC) 20 MG capsule, Take 20 mg by mouth daily.  (Patient not taking: Reported on 10/27/2022), Disp: , Rfl:    ondansetron  (ZOFRAN ) 4 MG tablet, Take 1 tablet (4 mg total) by mouth every 8 (eight) hours as needed for nausea or vomiting. (Patient not taking: Reported  on 06/19/2021), Disp: 10 tablet, Rfl: 0   silver  sulfADIAZINE  (SILVADENE ) 1 % cream, Apply 1 application topically daily as needed (skin irritation). (Patient not taking: Reported on 06/19/2021), Disp: , Rfl:    vitamin E 180 MG (400 UNITS) capsule, Take 800 Units by mouth daily. (Patient not taking: Reported on 06/19/2021), Disp: , Rfl:   Physical exam:  Vitals:   10/27/23 1120  BP: (!) 156/65  Pulse: 69  Resp: 18  Temp: 100 F (37.8 C)  TempSrc: Tympanic  Weight: 197 lb 9.6 oz (89.6 kg)  Height: 5' 4 (1.626 m)   Physical Exam  Cardiovascular:     Rate and Rhythm: Normal rate and regular rhythm.     Heart sounds: Normal heart sounds.  Pulmonary:     Effort: Pulmonary effort is normal.     Breath sounds: Normal breath sounds.  Abdominal:     General: Bowel sounds are normal.   Skin:    General: Skin is warm and dry.   Neurological:     Mental Status: She is alert and oriented to person, place, and time.    Breast exam was performed in seated and lying down position. Patient is status post right lumpectomy with a well-healed surgical scar. No evidence of any palpable masses. No evidence of axillary adenopathy. No evidence of any palpable masses or lumps in the left breast. No evidence of leftt axillary adenopathy   I have personally reviewed labs listed below:    Latest Ref Rng & Units 04/27/2023   11:04 AM  CMP  Glucose 70 - 99 mg/dL 161   BUN 8 - 23 mg/dL 18   Creatinine 0.96 - 1.00 mg/dL 0.45   Sodium 409 - 811 mmol/L 141   Potassium 3.5 - 5.1 mmol/L 3.6   Chloride 98 - 111 mmol/L 106   CO2 22 - 32 mmol/L 26   Calcium 8.9 - 10.3 mg/dL 9.8   Total Protein 6.5 - 8.1 g/dL 6.8   Total Bilirubin <9.1 mg/dL 1.6   Alkaline Phos 38 - 126 U/L 131   AST 15 - 41 U/L 37   ALT 0 - 44 U/L 31       Latest Ref Rng & Units 10/30/2020   12:58 PM  CBC  WBC 4.0 - 10.5 K/uL 5.7   Hemoglobin 12.0 - 15.0 g/dL 47.8   Hematocrit 29.5 - 46.0 % 39.9   Platelets 150 - 400 K/uL 169       Assessment and plan- Patient is a 73 y.o. female with history of stage I ER/PR positive HER2 negative right breast cancer status postlumpectomy and adjuvant radiation therapy.  She is here for routine surveillance visit for breast cancer  Clinically patient is doing well with no concerning signs and symptoms of recurrence based on today's exam.  She is tolerating letrozole  well without any significant side effects.  She will continue taking this for 1 more year ending in February 2026.I will schedule him hemogram for December 2025.  I will see her back in 6 months no labs.  Patient has baseline osteopenia involving the right hip but does not require any bisphosphonates presently.  She is not on calcium and vitamin D due to her prior history of sarcoidosis.   Visit Diagnosis 1. Encounter for follow-up surveillance of breast cancer   2. Use of letrozole  (Femara )      Dr. Seretha Dance, MD, MPH [facility] [phone] 10/27/2023 1:53 PM

## 2023-10-27 NOTE — Progress Notes (Signed)
 Patient reports she's doing well and has no new or acute concerns at this time.

## 2023-11-04 DIAGNOSIS — H01009 Unspecified blepharitis unspecified eye, unspecified eyelid: Secondary | ICD-10-CM | POA: Diagnosis not present

## 2023-11-04 DIAGNOSIS — H04123 Dry eye syndrome of bilateral lacrimal glands: Secondary | ICD-10-CM | POA: Diagnosis not present

## 2023-11-04 DIAGNOSIS — H0289 Other specified disorders of eyelid: Secondary | ICD-10-CM | POA: Diagnosis not present

## 2023-12-08 ENCOUNTER — Other Ambulatory Visit: Payer: Self-pay | Admitting: Oncology

## 2023-12-08 DIAGNOSIS — Z170 Estrogen receptor positive status [ER+]: Secondary | ICD-10-CM

## 2023-12-30 DIAGNOSIS — H04123 Dry eye syndrome of bilateral lacrimal glands: Secondary | ICD-10-CM | POA: Diagnosis not present

## 2023-12-30 DIAGNOSIS — B880 Other acariasis: Secondary | ICD-10-CM | POA: Diagnosis not present

## 2023-12-30 DIAGNOSIS — H02053 Trichiasis without entropian right eye, unspecified eyelid: Secondary | ICD-10-CM | POA: Diagnosis not present

## 2024-01-20 DIAGNOSIS — H04123 Dry eye syndrome of bilateral lacrimal glands: Secondary | ICD-10-CM | POA: Diagnosis not present

## 2024-01-25 DIAGNOSIS — I10 Essential (primary) hypertension: Secondary | ICD-10-CM | POA: Diagnosis not present

## 2024-02-01 DIAGNOSIS — G4733 Obstructive sleep apnea (adult) (pediatric): Secondary | ICD-10-CM | POA: Diagnosis not present

## 2024-02-01 DIAGNOSIS — I129 Hypertensive chronic kidney disease with stage 1 through stage 4 chronic kidney disease, or unspecified chronic kidney disease: Secondary | ICD-10-CM | POA: Diagnosis not present

## 2024-02-01 DIAGNOSIS — K760 Fatty (change of) liver, not elsewhere classified: Secondary | ICD-10-CM | POA: Diagnosis not present

## 2024-02-01 DIAGNOSIS — R300 Dysuria: Secondary | ICD-10-CM | POA: Diagnosis not present

## 2024-02-01 DIAGNOSIS — J849 Interstitial pulmonary disease, unspecified: Secondary | ICD-10-CM | POA: Diagnosis not present

## 2024-02-01 DIAGNOSIS — E7800 Pure hypercholesterolemia, unspecified: Secondary | ICD-10-CM | POA: Diagnosis not present

## 2024-02-01 DIAGNOSIS — N1832 Chronic kidney disease, stage 3b: Secondary | ICD-10-CM | POA: Diagnosis not present

## 2024-03-22 DIAGNOSIS — H0289 Other specified disorders of eyelid: Secondary | ICD-10-CM | POA: Diagnosis not present

## 2024-03-22 DIAGNOSIS — B8809 Other acariasis: Secondary | ICD-10-CM | POA: Diagnosis not present

## 2024-04-28 ENCOUNTER — Inpatient Hospital Stay: Attending: Oncology | Admitting: Oncology

## 2024-04-28 ENCOUNTER — Encounter: Payer: Self-pay | Admitting: Oncology

## 2024-04-28 VITALS — BP 153/62 | HR 78 | Temp 97.8°F | Resp 19 | Ht 64.0 in | Wt 198.3 lb

## 2024-04-28 DIAGNOSIS — Z170 Estrogen receptor positive status [ER+]: Secondary | ICD-10-CM | POA: Diagnosis not present

## 2024-04-28 DIAGNOSIS — C50911 Malignant neoplasm of unspecified site of right female breast: Secondary | ICD-10-CM | POA: Insufficient documentation

## 2024-04-28 DIAGNOSIS — Z79899 Other long term (current) drug therapy: Secondary | ICD-10-CM | POA: Diagnosis not present

## 2024-04-28 DIAGNOSIS — Z08 Encounter for follow-up examination after completed treatment for malignant neoplasm: Secondary | ICD-10-CM

## 2024-04-28 DIAGNOSIS — Z853 Personal history of malignant neoplasm of breast: Secondary | ICD-10-CM | POA: Diagnosis not present

## 2024-04-28 DIAGNOSIS — Z79811 Long term (current) use of aromatase inhibitors: Secondary | ICD-10-CM | POA: Insufficient documentation

## 2024-04-28 DIAGNOSIS — Z1721 Progesterone receptor positive status: Secondary | ICD-10-CM | POA: Insufficient documentation

## 2024-04-28 DIAGNOSIS — Z1732 Human epidermal growth factor receptor 2 negative status: Secondary | ICD-10-CM | POA: Diagnosis not present

## 2024-04-28 NOTE — Progress Notes (Signed)
 Patient states she would like to discuss a medication for dry mouth today.

## 2024-04-28 NOTE — Progress Notes (Signed)
 "    Hematology/Oncology Consult note [facility]  [phone] [phone]  Patient Care Team: Rudolpho Norleen BIRCH, MD as PCP - General (Internal Medicine) Verdia Art, MD as Consulting Physician (Pulmonary Disease) Rodolph Romano, MD as Consulting Physician (General Surgery) Lenn Aran, MD as Referring Physician (Radiation Oncology) Dannielle Arlean FALCON, RN (Inactive) as Oncology Nurse Navigator Hawthorn, Ladell POUR, MD as Consulting Physician (Gastroenterology) Kip Rosaline JAYSON DEVONNA as Physician Assistant Parris Manna, MD as Consulting Physician (Pulmonary Disease) Melanee Annah JAYSON, MD as Consulting Physician (Oncology)   Name of the patient: Jill Bond  [id number]  Sep 27, 1950   Date of visit: 04/28/2024  Diagnosis- history of stage Ia right breast cancer ER/PR positive HER2 negative   Chief complaint/ Reason for visit- routine surveillance of breast cancer  Heme/Onc history: Patient is a 73 year old female diagnosed with stage Ia right breast cancer in September 2020. 1.2 cm grade 1 invasive mammary carcinoma with low-grade DCIS. 1 out of 3 sentinel lymph nodes positive for micrometastatic carcinoma. Tumor greater than 90% ER positive, PR 51 to 90% positive and HER2 negative. Oncotype score was 16 and she did not require any adjuvant chemotherapy. Patient also has a history of sarcoidosis biopsy-proven on supraclavicular lymph node biopsy. Mild hypercalcemia secondary to that. History of multiple angiomyolipoma s/p left nephrectomy in 2007. Baseline bone density scan normal. She began Femara  in February 2021. Also in July 2021 there was some enhancement noted at the base of the right nipple. She had an excisional biopsy which showed focal duct ectasia and usual ductal hyperplasia but no evidence of malignancy   Interval history- [name] is a 73 year old female with stage I ER-positive breast cancer who presents for  routine five-year oncology follow-up while on adjuvant letrozole .  She reports that she has been taking letrozole  for nearly five years for stage I ER-positive breast cancer. She previously discontinued a different medication, which she referred to as 'the chemo,' due to concerns for hepatic side effects. She remains on letrozole .  She has not yet undergone her annual mammogram for this year. She denies new breast symptoms or systemic complaints.      ECOG PS- 1 Pain scale- 0   Review of systems- Review of Systems  Constitutional:  Positive for malaise/fatigue. Negative for chills, fever and weight loss.  HENT:  Negative for congestion, ear discharge and nosebleeds.   Eyes:  Negative for blurred vision.  Respiratory:  Negative for cough, hemoptysis, sputum production, shortness of breath and wheezing.   Cardiovascular:  Negative for chest pain, palpitations, orthopnea and claudication.  Gastrointestinal:  Negative for abdominal pain, blood in stool, constipation, diarrhea, heartburn, melena, nausea and vomiting.  Genitourinary:  Negative for dysuria, flank pain, frequency, hematuria and urgency.  Musculoskeletal:  Negative for back pain, joint pain and myalgias.  Skin:  Negative for rash.  Neurological:  Negative for dizziness, tingling, focal weakness, seizures, weakness and headaches.  Endo/Heme/Allergies:  Does not bruise/bleed easily.  Psychiatric/Behavioral:  Negative for depression and suicidal ideas. The patient does not have insomnia.       Allergies[1]   Past Medical History:  Diagnosis Date   Anxiety    Arthritis    Breast cancer (HCC) 01/2019   invasive mammary carcinoma    Chronic kidney disease 2006   Left Nephrectomy   Complication of anesthesia    PONV   Constipation    Depression    GERD (gastroesophageal reflux disease)    Headache    migraines  Hypertension    Personal history of radiation therapy    PONV (postoperative nausea and vomiting)     Rotator cuff disorder    LEFT   Sarcoidosis    Shortness of breath dyspnea    with exertion   Sleep apnea    Trigger finger, right      Past Surgical History:  Procedure Laterality Date   ABDOMINAL HYSTERECTOMY     PARTIAL   ACHILLES TENDON SURGERY Left 02/22/2015   Procedure: Secondary ACHILLES TENDON REPAIR;  Surgeon: Donnice Cory, DPM;  Location: [facility];  Service: Podiatry;  Laterality: Left;   Bladder tac     BREAST BIOPSY Right 01/23/2019   affirm stereo bx of calcs with x marker, ductal hyperplasia   BREAST BIOPSY Right 01/23/2019   us  bx of mass with coil marker, invasive mammary carcinoma/DCIS   BREAST EXCISIONAL BIOPSY Left 2003   neg   BREAST LUMPECTOMY Right 02/06/2019   IMC/DCIS clear margins, ONE LYMPH NODE WITH 1.2 MM FOCUS OF MICRO METASTATIC CARCINOMA   COLONOSCOPY  2013   COLONOSCOPY WITH PROPOFOL  N/A 03/02/2022   Procedure: COLONOSCOPY WITH PROPOFOL ;  Surgeon: Maryruth Ole DASEN, MD;  Location: [facility];  Service: Endoscopy;  Laterality: N/A;   EXCISION / BIOPSY BREAST / NIPPLE / DUCT Right 01/19/2020   duct excision due to nipple d/c, Usual ductal hyperplasia, patchy stromal fibrosis. No atypia or malignancy   EXCISION OF BREAST BIOPSY Right 01/19/2020   Procedure: EXCISION OF BREAST BIOPSY;  Surgeon: Rodolph Romano, MD;  Location: [facility];  Service: General;  Laterality: Right;   JOINT REPLACEMENT Left 2007   Total Knee Replacement   KIDNEY CYST REMOVAL Left    NEPHRECTOMY Left    OSTECTOMY Left 02/22/2015   Procedure: OSTECTOMY;  Surgeon: Donnice Cory, DPM;  Location: [facility];  Service: Podiatry;  Laterality: Left;   PARTIAL MASTECTOMY WITH NEEDLE LOCALIZATION AND AXILLARY SENTINEL LYMPH NODE BX Right 02/06/2019   Procedure: PARTIAL MASTECTOMY WITH NEEDLE LOCALIZATION AND AXILLARY SENTINEL LYMPH NODE BX;  Surgeon: Rodolph Romano, MD;  Location: [facility];  Service: General;  Laterality: Right;   SHOULDER ARTHROSCOPY W/ ROTATOR  CUFF REPAIR Left YRS AGO   TONSILLECTOMY  AGE 74   TOTAL KNEE ARTHROPLASTY Right 10/22/2015   Procedure: RIGHT TOTAL KNEE ARTHROPLASTY;  Surgeon: Donnice Car, MD;  Location: [facility];  Service: Orthopedics;  Laterality: Right;   TOTAL KNEE ARTHROPLASTY Right    10/22/2015    Social History   Socioeconomic History   Marital status: Widowed    Spouse name: Not on file   Number of children: Not on file   Years of education: Not on file   Highest education level: Not on file  Occupational History   Not on file  Tobacco Use   Smoking status: Never   Smokeless tobacco: Never  Vaping Use   Vaping status: Never Used  Substance and Sexual Activity   Alcohol  use: No   Drug use: No   Sexual activity: Not on file  Other Topics Concern   Not on file  Social History Narrative   Not on file   Social Drivers of Health   Tobacco Use: Low Risk (04/28/2024)   Patient History    Smoking Tobacco Use: Never    Smokeless Tobacco Use: Never    Passive Exposure: Not on file  Financial Resource Strain: Low Risk  (08/03/2023)   Received from [facility]   Overall Financial Resource Strain (CARDIA)  Difficulty of Paying Living Expenses: Not hard at all  Food Insecurity: No Food Insecurity (08/03/2023)   Received from [facility]   Epic    Within the past 12 months, you worried that your food would run out before you got the money to buy more.: Never true    Within the past 12 months, the food you bought just didn't last and you didn't have money to get more.: Never true  Transportation Needs: No Transportation Needs (08/03/2023)   Received from [facility] - Transportation    In the past 12 months, has lack of transportation kept you from medical appointments or from getting medications?: No    Lack of Transportation (Non-Medical): No  Physical Activity: Not on file  Stress: Not on file  Social Connections: Not on file  Intimate  Partner Violence: Not on file  Depression (PHQ2-9): Low Risk (04/28/2024)   Depression (PHQ2-9)    PHQ-2 Score: 0  Alcohol  Screen: Not on file  Housing: Unknown (08/03/2023)   Received from [facility]   Epic    At any time in the past 12 months, were you homeless or living in a shelter (including now)?: No    Number of Times Moved in the Last Year: Not on file    In the last 12 months, was there a time when you were not able to pay the mortgage or rent on time?: No  Utilities: Not At Risk (08/03/2023)   Received from [facility] Utilities    Threatened with loss of utilities: No  Health Literacy: Not on file    Family History  Problem Relation Age of Onset   Breast cancer Mother 7   Heart failure Father    Prostate cancer Father     Current Medications[2]  Physical exam:  Vitals:   04/28/24 1136 04/28/24 1139  BP: (!) 143/67 (!) 153/62  Pulse: 78   Resp: 19   Temp: 97.8 F (36.6 C)   TempSrc: Tympanic   SpO2: 99%   Weight: 198 lb 4.8 oz (89.9 kg)   Height: 5' 4 (1.626 m)    Physical Exam Cardiovascular:     Rate and Rhythm: Normal rate and regular rhythm.     Heart sounds: Normal heart sounds.  Pulmonary:     Effort: Pulmonary effort is normal.     Breath sounds: Normal breath sounds.  Skin:    General: Skin is warm and dry.  Neurological:     Mental Status: She is alert and oriented to person, place, and time.      I have personally reviewed labs listed below:    Latest Ref Rng & Units 04/27/2023   11:04 AM  CMP  Glucose 70 - 99 mg/dL 845   BUN 8 - 23 mg/dL 18   Creatinine 9.55 - 1.00 mg/dL 9.01   Sodium 864 - 854 mmol/L 141   Potassium 3.5 - 5.1 mmol/L 3.6   Chloride 98 - 111 mmol/L 106   CO2 22 - 32 mmol/L 26   Calcium 8.9 - 10.3 mg/dL 9.8   Total Protein 6.5 - 8.1 g/dL 6.8   Total Bilirubin <8.7 mg/dL 1.6   Alkaline Phos 38 - 126 U/L 131   AST 15 - 41 U/L 37   ALT 0 - 44 U/L 31       Latest Ref  Rng & Units 10/30/2020   12:58 PM  CBC  WBC 4.0 - 10.5 K/uL 5.7   Hemoglobin 12.0 - 15.0 g/dL 86.4   Hematocrit 63.9 - 46.0 % 39.9   Platelets 150 - 400 K/uL 169       Assessment and plan- Patient is a 73 y.o. female here for routine surveillance of breast cancer  Assessment and Plan    Stage I ER-positive breast cancer on endocrine therapy Nearing completion of five years of adjuvant letrozole . Extended endocrine therapy not indicated based on stage and risk profile. Routine surveillance appropriate. - Instructed to complete five years of letrozole , discontinue in February at her discretion. - Ordered annual screening mammogram. - Scheduled annual follow-up visit.         Visit Diagnosis 1. High risk medication use   2. Encounter for follow-up surveillance of breast cancer      Dr. Annah Skene, MD, MPH [facility] at [facility] [phone] 04/28/2024 3:21 PM                   [1]  Allergies Allergen Reactions   Codeine Nausea And Vomiting   Penicillins Hives and Other (See Comments)    Has patient had a PCN reaction causing immediate rash, facial/tongue/throat swelling, SOB or lightheadedness with hypotension: no Has patient had a PCN reaction causing severe rash involving mucus membranes or skin necrosis: no Has patient had a PCN reaction that required hospitalization no Has patient had a PCN reaction occurring within the last 10 years: no If all of the above answers are NO, then may proceed with Cephalosporin use.   [2]  Current Outpatient Medications:    amLODipine  (NORVASC ) 10 MG tablet, Take 10 mg by mouth at bedtime., Disp: , Rfl:    aspirin -acetaminophen -caffeine (MIGRAINE RELIEF) 250-250-65 MG tablet, Take 1 tablet by mouth every 6 (six) hours as needed for headache., Disp: , Rfl:    cyclobenzaprine (FLEXERIL) 10 MG tablet, Take 1 tablet by mouth 2 (two) times daily. , Disp: , Rfl:    diclofenac sodium (VOLTAREN) 1 % GEL, Apply  1 application topically 2 (two) times daily as needed (foot pain)., Disp: , Rfl:    letrozole  (FEMARA ) 2.5 MG tablet, TAKE 1 TABLET EVERY DAY, Disp: 90 tablet, Rfl: 3   metoprolol  (TOPROL -XL) 200 MG 24 hr tablet, Take 100 mg by mouth daily., Disp: , Rfl:    omeprazole  (PRILOSEC) 20 MG capsule, Take 20 mg by mouth daily. , Disp: , Rfl:    docusate sodium  (COLACE) 100 MG capsule, Take 300 mg by mouth at bedtime. (Patient not taking: Reported on 04/28/2024), Disp: , Rfl:    folic acid  (FOLVITE ) 1 MG tablet, Take 1 mg by mouth daily., Disp: , Rfl:    methotrexate  (RHEUMATREX) 2.5 MG tablet, TAKE 4 TABLETS (10 MG TOTAL) BY MOUTH ONCE A WEEK. CAUTION:CHEMOTHERAPY. PROTECT FROM LIGHT. (Patient not taking: Reported on 10/27/2022), Disp: 52 tablet, Rfl: 2   Multiple Vitamin (MULTIVITAMIN WITH MINERALS) TABS tablet, Take 1 tablet by mouth daily.  (Patient not taking: Reported on 06/19/2021), Disp: , Rfl:    nystatin  (MYCOSTATIN /NYSTOP ) powder, Apply 1 application topically 3 (three) times daily. (Patient not taking: Reported on 06/19/2021), Disp: 15 g, Rfl: 0   Omega-3 Fatty Acids (OMEGA 3 500) 500 MG CAPS, Take 500 mg by mouth daily. (Patient not taking: Reported on 06/19/2021), Disp: , Rfl:    ondansetron  (ZOFRAN ) 4 MG tablet, Take 1 tablet (4 mg total) by mouth every 8 (eight) hours as needed for nausea or vomiting. (Patient not taking:  Reported on 06/19/2021), Disp: 10 tablet, Rfl: 0   silver  sulfADIAZINE  (SILVADENE ) 1 % cream, Apply 1 application topically daily as needed (skin irritation). (Patient not taking: Reported on 06/19/2021), Disp: , Rfl:    vitamin E 180 MG (400 UNITS) capsule, Take 800 Units by mouth daily. (Patient not taking: Reported on 06/19/2021), Disp: , Rfl:   "

## 2024-05-24 ENCOUNTER — Ambulatory Visit
Admission: RE | Admit: 2024-05-24 | Discharge: 2024-05-24 | Disposition: A | Discharge status: Home / Self Care | Source: Ambulatory Visit | Attending: Oncology | Admitting: Oncology

## 2024-05-24 DIAGNOSIS — Z853 Personal history of malignant neoplasm of breast: Secondary | ICD-10-CM | POA: Diagnosis not present

## 2024-05-24 DIAGNOSIS — Z1231 Encounter for screening mammogram for malignant neoplasm of breast: Secondary | ICD-10-CM | POA: Insufficient documentation

## 2025-04-25 ENCOUNTER — Inpatient Hospital Stay: Admitting: Oncology
